# Patient Record
Sex: Male | Born: 1950 | Race: White | Hispanic: No | Smoking: Current every day smoker
Health system: Southern US, Community
[De-identification: ages and names within clinical notes are randomized; demographics above are authoritative.]

## PROBLEM LIST (undated history)

## (undated) DIAGNOSIS — I10 Essential (primary) hypertension: Secondary | ICD-10-CM

## (undated) DIAGNOSIS — J449 Chronic obstructive pulmonary disease, unspecified: Secondary | ICD-10-CM

## (undated) DIAGNOSIS — K219 Gastro-esophageal reflux disease without esophagitis: Secondary | ICD-10-CM

## (undated) DIAGNOSIS — J439 Emphysema, unspecified: Secondary | ICD-10-CM

## (undated) DIAGNOSIS — G40909 Epilepsy, unspecified, not intractable, without status epilepticus: Secondary | ICD-10-CM

## (undated) DIAGNOSIS — R413 Other amnesia: Secondary | ICD-10-CM

## (undated) DIAGNOSIS — S0990XA Unspecified injury of head, initial encounter: Secondary | ICD-10-CM

## (undated) DIAGNOSIS — M549 Dorsalgia, unspecified: Secondary | ICD-10-CM

## (undated) DIAGNOSIS — B1920 Unspecified viral hepatitis C without hepatic coma: Secondary | ICD-10-CM

## (undated) DIAGNOSIS — R0600 Dyspnea, unspecified: Secondary | ICD-10-CM

## (undated) DIAGNOSIS — J189 Pneumonia, unspecified organism: Secondary | ICD-10-CM

## (undated) HISTORY — PX: BRAIN SURGERY: SHX531

## (undated) HISTORY — DX: Chronic obstructive pulmonary disease, unspecified: J44.9

## (undated) HISTORY — DX: Emphysema, unspecified: J43.9

## (undated) HISTORY — PX: SKULL FRACTURE ELEVATION: SHX781

## (undated) HISTORY — PX: MANDIBLE FRACTURE SURGERY: SHX706

## (undated) HISTORY — PX: FRACTURE SURGERY: SHX138

## (undated) HISTORY — DX: Unspecified viral hepatitis C without hepatic coma: B19.20

## (undated) HISTORY — PX: INGUINAL HERNIA REPAIR: SUR1180

## (undated) HISTORY — PX: SHOULDER ARTHROSCOPY: SHX128

## (undated) HISTORY — PX: NASAL FRACTURE SURGERY: SHX718

---

## 2002-02-09 ENCOUNTER — Ambulatory Visit (HOSPITAL_COMMUNITY): Admission: RE | Admit: 2002-02-09 | Discharge: 2002-02-09 | Payer: Self-pay | Admitting: Internal Medicine

## 2002-06-09 ENCOUNTER — Emergency Department (HOSPITAL_COMMUNITY): Admission: EM | Admit: 2002-06-09 | Discharge: 2002-06-09 | Payer: Self-pay | Admitting: Emergency Medicine

## 2002-06-10 ENCOUNTER — Emergency Department (HOSPITAL_COMMUNITY): Admission: EM | Admit: 2002-06-10 | Discharge: 2002-06-10 | Payer: Self-pay | Admitting: Emergency Medicine

## 2002-12-19 ENCOUNTER — Ambulatory Visit (HOSPITAL_COMMUNITY): Admission: RE | Admit: 2002-12-19 | Discharge: 2002-12-19 | Payer: Self-pay | Admitting: Pulmonary Disease

## 2003-01-21 ENCOUNTER — Encounter (HOSPITAL_COMMUNITY): Admission: RE | Admit: 2003-01-21 | Discharge: 2003-02-20 | Payer: Self-pay | Admitting: Pulmonary Disease

## 2004-04-27 ENCOUNTER — Observation Stay (HOSPITAL_COMMUNITY): Admission: RE | Admit: 2004-04-27 | Discharge: 2004-04-27 | Payer: Self-pay | Admitting: General Surgery

## 2004-07-07 ENCOUNTER — Ambulatory Visit (HOSPITAL_COMMUNITY): Admission: RE | Admit: 2004-07-07 | Discharge: 2004-07-07 | Payer: Self-pay | Admitting: Pulmonary Disease

## 2006-05-25 ENCOUNTER — Ambulatory Visit (HOSPITAL_COMMUNITY): Admission: RE | Admit: 2006-05-25 | Discharge: 2006-05-25 | Payer: Self-pay | Admitting: Pulmonary Disease

## 2008-09-16 ENCOUNTER — Ambulatory Visit: Payer: Self-pay | Admitting: Surgery

## 2010-10-12 ENCOUNTER — Ambulatory Visit (HOSPITAL_COMMUNITY)
Admission: RE | Admit: 2010-10-12 | Discharge: 2010-10-12 | Payer: Self-pay | Source: Home / Self Care | Attending: Pulmonary Disease | Admitting: Pulmonary Disease

## 2011-02-05 ENCOUNTER — Emergency Department (HOSPITAL_COMMUNITY)
Admission: EM | Admit: 2011-02-05 | Discharge: 2011-02-05 | Disposition: A | Discharge status: Home / Self Care | Payer: Medicare Other | Attending: Emergency Medicine | Admitting: Emergency Medicine

## 2011-02-05 DIAGNOSIS — I10 Essential (primary) hypertension: Secondary | ICD-10-CM | POA: Insufficient documentation

## 2011-02-05 DIAGNOSIS — G40909 Epilepsy, unspecified, not intractable, without status epilepticus: Secondary | ICD-10-CM | POA: Insufficient documentation

## 2011-02-05 DIAGNOSIS — R350 Frequency of micturition: Secondary | ICD-10-CM | POA: Insufficient documentation

## 2011-02-05 DIAGNOSIS — R109 Unspecified abdominal pain: Secondary | ICD-10-CM | POA: Insufficient documentation

## 2011-02-05 DIAGNOSIS — R300 Dysuria: Secondary | ICD-10-CM | POA: Insufficient documentation

## 2011-02-05 DIAGNOSIS — Z79899 Other long term (current) drug therapy: Secondary | ICD-10-CM | POA: Insufficient documentation

## 2011-02-05 LAB — URINALYSIS, ROUTINE W REFLEX MICROSCOPIC
Glucose, UA: NEGATIVE mg/dL
Ketones, ur: NEGATIVE mg/dL
Leukocytes, UA: NEGATIVE
Nitrite: NEGATIVE
Specific Gravity, Urine: 1.01 (ref 1.005–1.030)
Urobilinogen, UA: 0.2 mg/dL (ref 0.0–1.0)

## 2011-02-07 LAB — URINE CULTURE: Culture  Setup Time: 201204062102

## 2011-03-16 NOTE — Assessment & Plan Note (Signed)
OFFICE VISIT   [name], [name] T  DOB:  [birth year]                                       09/16/2008  ZOXWR#:[id number]   REASON FOR VISIT:  Claudication.   HISTORY:  This is a 60 year old gentleman I am seeing at request of Dr.  Juanetta Gosling for evaluation of left leg claudication.  The patient states  that he has been having left leg pain for many months which has gotten  worse.  He states that he initially began having problems in his left  calf.  He states he has cramping his calf after approximately 40 yards  of walking.  It is relieved by rest.  Over the past several weeks,  however, he has also had a different kind pain which he describes as an  aching feeling that he gets in his thigh and his hip.  This pain will  generally come about at rest and is not related to exercise.  He denies  having any symptoms of rest pain.  He denies having any nonhealing  wounds.   The patient does take medication for high blood pressure.  He is also a  1-pack-a-day smoker.   REVIEW OF SYSTEMS:  GENERAL:  Negative for fevers, chills, weight gain,  weight loss.  CARDIAC:  Negative.  PULMONARY:  Negative.  GI:  Negative.  GU:  Negative.  VASCULAR:  Has pain in legs with walking.  NEURO:  Positive for seizures.  ORTHO:  Positive for back pain.  PSYCH:  Negative.  ENT:  Negative.  HEME:  Negative.   PAST MEDICAL HISTORY:  Hypertension, epilepsy.   PAST SURGICAL HISTORY:  Skull fracture repair, bilateral inguinal  hernia.   FAMILY HISTORY:  Positive for cardiovascular disease at a young age in  his father and his brother.   SOCIAL HISTORY:  He is single, disabled, currently smokes a pack a day,  drinks occasional alcohol.   MEDICATIONS:  Hydrochlorothiazide 25 mg per day, atenolol 50 mg per day,  divalproex EC 500 mg twice daily, aspirin 81 mg twice daily.   ALLERGIES:  None.   PHYSICAL EXAMINATION:  Vital Signs:  Blood pressure is 166/89, pulse 60,  temperature 98.  HEENT:  Normocephalic, atraumatic.  Pupils equal,  round, reactive.  General:  He is well-appearing, no distress.  Neck:  Supple, no JVD, no carotid bruits.  Cardiovascular:  Regular rate and  rhythm.  No murmurs, rubs or gallops.  Pulmonary:  Lungs are clear  bilaterally.  Abdomen:  Soft, no pulsatile mass.  Extremities:  Warm,  well-perfused.  He has palpable femoral pulses.  No ulceration.  Skin:  Without rash.  Neuro:  Cranial nerves II-XII are grossly intact.  Psych:  He is alert and oriented x3.   DIAGNOSTIC STUDIES:  Ankle brachial indices were performed today.  He  has an ankle brachial index of 0.79 on the left and 1.2 on the right.   ASSESSMENT/PLAN:  Left leg claudication.   PLAN:  We had an extensive conversation today regarding the patient's  options for management.  First, I told him that the pain he is having in  his left hip and thigh, which occurs at rest, is unlikely related to his  circulation.  His circulation issues involve the cramping that he gets  in his left calf after walking 40 yards.  We discussed the  options for  management at this time including surgery, percutaneous treatment and  medical therapy.  I do not think the patient's symptoms are severe  enough to warrant any kind of invasive measure at this time and we need  to focus on medical management.  We had a long conversation regarding  the necessity of smoking cessation.  The patient states that he is eager  to stop.  We will need to work out a plan to get him to stop.  I have  also recommended that we start him on cilostazol as this may improve his  walking distance.  I have given him a prescription for this and he will  see how much it costs prior to filling it.  I plan on seeing him back in  6 months for evaluation of how he is doing.   Jorge Ny, MD  Electronically Signed   VWB/MEDQ  D:  09/16/2008  T:  09/17/2008  Job:  [id number]   cc:   Ramon Dredge L. Juanetta Gosling, M.D.

## 2011-03-19 NOTE — Op Note (Signed)
NAME:  [name] [name], [name] [name]                        ACCOUNT NO.:  [id number]   MEDICAL RECORD NO.:  [id number]                   PATIENT TYPE:  AMB   LOCATION:  DAY                                  FACILITY:  [facility]   PHYSICIAN:  Dalia Heading, M.D.               DATE OF BIRTH:  Apr 26, 1951   DATE OF PROCEDURE:  04/27/2004  DATE OF DISCHARGE:                                 OPERATIVE REPORT   PREOPERATIVE DIAGNOSIS:  Recurrent left inguinal hernia.   POSTOPERATIVE DIAGNOSIS:  Recurrent left inguinal hernia.   PROCEDURE:  Recurrent left inguinal herniorrhaphy.   SURGEON:  Dalia Heading, M.D.   ANESTHESIA:  Spinal.   INDICATIONS:  The patient is a 60 year old white male who presents with a  recurrent left inguinal hernia which is symptomatic.  The risks and benefits  of the procedure including bleeding, infection, pain, and the possibility of  recurrence of the hernia were fully explained to the patient, who gave  informed consent.   DESCRIPTION OF PROCEDURE:  The patient was placed in the supine position  after spinal anesthesia was administered.  The left groin region was prepped  and draped using the usual sterile technique with Betadine. Surgical site  confirmation was performed.   A transverse incision was made in the left groin region down to the external  oblique aponeurosis.  The aponeurosis was incised to the external ring.  A  Penrose drain was placed around the spermatic cord.  The vas deferens was  noted within the spermatic cord.  A small lipoma of the cord was found.  This was partially excised.  Care was taken care not to fully skeletonize  the cord.  The patient did have a direct hernia.  This was incised at its  base and a medium size plug was placed in this region, secured  circumferentially to the transversalis fascia using 2-0 Novofil interrupted  sutures.   An [place]lay patch was then placed along the floor of ine inguinal canal and  secured; superiorly to the  conjoined tendon, and inferiorly to the shelving  edge of Poupart's ligament using a 2-0 Novofil interrupted suture.  The  internal ring was recreated using a 2-0 Novofil interrupted suture.  The  external oblique aponeurosis was reapproximated using a 2-0 Vicryl running  suture.  The subcutaneous layer was reapproximated using a 3-0 Vicryl  interrupted suture.  The skin was closed using a 4-0 Vicryl subcuticular  suture  Sensorcaine 0.5% was instilled into the surrounding wound and the  wound was covered with Collodion.   All tape and needle counts were correct at the end of the procedure.  The  patient was transferred to PACU in stable condition.   COMPLICATIONS:  None.   SPECIMENS:  None.   BLOOD LOSS:  Minimal.      ___________________________________________  Dalia Heading, M.D.   MAJ/MEDQ  D:  04/27/2004  T:  04/27/2004  Job:  [id number]   cc:   Dalia Heading, M.D.  [address], [place]  [place] [address]  Fax: [phone]   Oneal Deputy. Juanetta Gosling, M.D.  [address]  [place]  [place] [address]  Fax: [phone]

## 2011-03-19 NOTE — H&P (Signed)
NAME:  [name] [name], [name] [name] NO.:  [id number]   MEDICAL RECORD NO.:  [id number]                  PATIENT TYPE:   LOCATION:                                       FACILITY:   PHYSICIAN:  Dalia Heading, M.D.               DATE OF BIRTH:  Mar 20, 1951   DATE OF ADMISSION:  DATE OF DISCHARGE:                                HISTORY & PHYSICAL   CHIEF COMPLAINT:  Recurrent left inguinal hernia.   HISTORY OF PRESENT ILLNESS:  The patient is a 60 year old white male who is  referred for evaluation and treatment of recurrent left inguinal hernia.  He  had left inguinal herniorrhaphy in the remote past.  He started having  increasing swelling and discomfort over the past few years.  No nausea or  vomiting had been noted.   PAST MEDICAL HISTORY:  Epilepsy.   PAST SURGICAL HISTORY:  1. Bilateral inguinal herniorrhaphies.  2. Brain surgery in the past.   CURRENT MEDICATIONS:  1. Depakote 500 mg p.o. b.i.d.  2. Atenolol one tablet p.o. daily.  3. Hydrochlorothiazide one tablet p.o. daily.   ALLERGIES:  No known drug allergies.   REVIEW OF SYSTEMS:  The patient states he has a history of hepatitis C due  to a blood transfusion.  His epilepsy is currently under control.  No other  cardiopulmonary difficulties or bleeding disorders were noted.   SOCIAL HISTORY:  He does smoke a pack and a half of cigarettes a day.  He  drinks alcohol on occasion.   PHYSICAL EXAMINATION:  GENERAL APPEARANCE:  Well-developed, well-nourished  white male in no acute distress.  VITAL SIGNS:  Afebrile, vital signs stable.  LUNGS:  Clear to auscultation with good breath sounds bilaterally.  HEART:  Regular rate and rhythm without S3, S4 or murmurs.  ABDOMEN:  Soft, nontender, nondistended.  No hepatosplenomegaly or masses  are noted.  Reducible left inguinal hernia is noted.  No right inguinal  hernia is noted.  GU:  Within normal limits.   IMPRESSION:  Recurrent left inguinal  hernia.   PLAN:  The patient is scheduled for recurrent left inguinal herniorrhaphy on  04/27/2004.  The risks and benefits of the procedure including bleeding,  infection and recurrence of the hernia were fully explained to the patient.  Gave informed consent.     ___________________________________________                                         Dalia Heading, M.D.   MAJ/MEDQ  D:  04/15/2004  T:  04/15/2004  Job:  [id number]   cc:   Ramon Dredge L. Juanetta Gosling, M.D.  [address]  [place]  [place] [address]  Fax: [phone]

## 2011-03-19 NOTE — H&P (Signed)
[facility]  Patient:    [name] [name] Visit Number: [id number] MRN: [id number]          Service Type: END Location: DAY Attending Physician:  [name] [name] Dictated by:   [name] [name], M.D. Admit Date:  02/09/2002 Discharge Date: 02/09/2002   CC:         [name] [name], M.D.  Ms. [name] [name], [facility] [address], [place], [place]   History and Physical  PRESENTING COMPLAINT:  Followed for chronic hepatitis C.  HISTORY OF PRESENT ILLNESS:  [name] [name] is a 60 year old Caucasian male, a patient of Dr. Kari [name], who is here to discuss further evaluation of chronic hepatitis C.  He has had mildly elevated liver-associated enzymes in the past. He decided to go to [facility] to donate blood, which was in May 2001; he states he really went to find out if he had hepatitis C or not.  This hepatitis C serology came back positive.  Once he became aware that he had hepatitis C, he decided to quit drinking alcohol, which he was only drinking intermittently.  His hepatitis B surface antigen was negative and hepatitis B core antibody (total) was also negative.  At that time, his ALT was 45.  He was initially seen on August 29, 2000.  His AST was 75 and gamma GT was 47, but ALT had not been checked.  His LFTs were repeated and AST was 52, ALT 39 (September 05, 2000).  Hepatitis C virus RNA antibody by PCR was positive. Further evaluation included upper abdominal ultrasound which was within normal limits (September 01, 2000).  Options were reviewed with the patient at that time and it was decided to just monitor him for a while.  His lab studies on December 08, 2000 revealed AST of 49 and ALT of 31; his albumin was 3.9.  He has been doing his own research and reading about hepatitis C.  He states that he is feeling fairly well, although he feel full in his right upper quadrant. He is always aware of the sensation.  He wonders if this is  just pathologic. He has a good appetite.  Since his first visit to our office, he has gained 10 pounds.  He does complain of fatigue which is mild.  He is working some part-time.  He denies skin rash, arthralgias or pruritus.  He also denies LE edema or abdominal swelling.  PRESENT MEDICATIONS: 1. Depakote 500 mg b.i.d. 2. Atenolol 50 mg q.d. 3. HCTZ 25 mg q.d.  PAST MEDICAL HISTORY:  He has hypertension and epilepsy.  He developed epilepsy at age 59 with a relapse five or six years ago.  At that time, he was found unconscious at home with multiple injuries and was treated at [facility], where he was hospitalized for four months; he was in a coma for several weeks and then he spent three more months in rehab.  He has gradually recovered a great deal.  He still does not know what actually happened to him.  He does give a history of neonatal jaundice but he states other siblings also had it.  He has had bilateral inguinal herniorrhaphies.  ALLERGIES:  None known.  FAMILY HISTORY:  He has one sister and two brothers in good health.  One brother has had CAD and died at age 17.  SOCIAL HISTORY:  He is divorced.  He does not have any children.  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  He lives alone.  He has been smoking for 25 years, less than a pack a day.  He used to drink socially but not every day, but has not had any alcohol in about two years.  PHYSICAL EXAMINATION:  GENERAL:  A pleasant, well-developed, well-nourished Caucasian male who is in no acute distress.  He weighs 186-1/2 pounds.  He is 5-feet 9-inches tall.  VITAL SIGNS:  Pulse 66 per minute, blood pressure 130/80.  HEENT:  Conjunctivae are pink.  Sclerae nonicteric.  NECK:  No adenopathy or thyromegaly.  HEART:  Within normal limits.  LUNGS:  Within normal limits.  ABDOMEN:  His abdomen is symmetrical and soft.  Liver edge is easily palpable below the right costal margin and is soft and nontender.  Span is  percussed to 12 to 13 cm.  Spleen is not palpable.  EXTREMITIES:  There is no peripheral edema noted.  ASSESSMENT:  [name] [name] is a 60 year old Caucasian male who was diagnosed with hepatitis C about two years ago.  His transaminases have been mildly abnormal initially but more recently, they have been normal.  Three weeks ago, his AST was 36 and ALT 21.  His risk factors include tattoos (two), not mentioned above.  He does not have any stigmata of chronic liver disease.  Before recommendations are made regarding treatment, one needs to know the disease activity.  If he has a chronic active hepatitis with fibrosis, he would be a candidate for antiviral therapy.  However, if he is diagnosed to have mild disease without fibrosis, we may want to hold off therapy until better options are available.  He is very much interested in liver biopsy, as he would like to know the stage of his liver disease.  Percutaneous liver biopsy to be performed at [facility] in near future.  I have reviewed the procedure and risks with the patient.  He was informed of the potential risk of bleeding and some discomfort, which we should be able to effectively treat.  We also reviewed other options of liver biopsy such as laparoscopic.  He wants to proceed with percutaneous biopsy.  He will have preoperative complete blood count, PT, PTT, bleeding time and he also will have type-and-hold for two units prior to liver biopsy.  Procedure will be performed at day hospital and hopefully he will be able to go home the same afternoon. Dictated by:   [name] [name], M.D. Attending Physician:  [name] [name] DD:  01/30/02 TD:  01/31/02 Job: [id number]

## 2011-03-19 NOTE — Op Note (Signed)
[facility]  Patient:    [name] [name], [name] [name] Visit Number: [id number] MRN: [id number]          Service Type: END Location: DAY Attending Physician:  Malissa Hippo Dictated by:   Lionel December, M.D. Proc. Date: 02/09/02   CC:         Kari Baars, M.D.   Operative Report  PROCEDURE:  Percutaneous liver biopsy.  INDICATIONS FOR PROCEDURE:  Dionne is a 60 year old Caucasian male with chronic hepatitis C. He is undergoing liver biopsy for staging to determine whether or not he has significant disease and might benefit from antiviral therapy. The procedure risks were reviewed with the patient and informed consent was obtained.  FINDINGS:  The procedure performed in the endoscopy suite. The patient was placed in supine position and liver ______ mid axillary line and side was marked. A puncture. The skin was prepped in the usual fashion with Betadine solution and isolated using sterile sheet. One percent xylocaine was injected in the skin and subcutaneous tissue using a 25 gauge needle. Deeper injections made with a 22 gauge spinal needle. Liver sounding with trocar of same needle. Liver biopsy was performed using ______ soft tissue biopsy needle. Two passed were made with the patient holding his breath at end expiration. Adequate liver tissue was obtained. The puncture site was covered with a Band-Aid and the patient was asked to lie on his right side.  FINAL DIAGNOSES:  Chronic hepatitis C status post percutaneous liver biopsy to determine disease ______.  PLAN:  He will be monitored in the hospital for next six hours. H&H will be repeated in about three hours. He should be able to go home this afternoon. Dictated by:   Lionel December, M.D. Attending Physician:  Malissa Hippo DD:  02/09/02 TD:  02/09/02 Job: [id number]

## 2011-05-18 NOTE — H&P (Signed)
[name] [name] is an 60 y.o. male.   Chief Complaint: Recurrent right inguinal hernia HPI: Patient is a 60 yo wm who presents with a symptomatic recurrent right inguinal hernia.  Was previously repaired in the remote past.  No past medical history on file.  No past surgical history on file.  No family history on file. Social History:  does not have a smoking history on file. He does not have any smokeless tobacco history on file. His alcohol and drug histories not on file.  Allergies: Allergies not on file  No current facility-administered medications on file as of .   No current outpatient prescriptions on file as of .    No results found for this or any previous visit (from the past 48 hour(s)). No results found.  Review of Systems  Constitutional: Negative.   HENT: Negative.   Eyes: Negative.   Respiratory: Negative.   Cardiovascular: Negative.   Gastrointestinal: Negative.   Genitourinary: Negative.   Musculoskeletal: Negative.   Skin: Negative.   Neurological: Negative.   Endo/Heme/Allergies: Negative.   Psychiatric/Behavioral: Negative.     There were no vitals taken for this visit. Physical Exam  Constitutional: He is oriented to person, place, and time. He appears well-developed and well-nourished.  HENT:  Head: Normocephalic and atraumatic.  Eyes: Pupils are equal, round, and reactive to light.  Neck: Normal range of motion. Neck supple.  Cardiovascular: Normal rate, regular rhythm and normal heart sounds.   Respiratory: Effort normal and breath sounds normal.  GI: Soft. Bowel sounds are normal.       Reducible right inguinal hernia beneath a surgical scar.  Neurological: He is alert and oriented to person, place, and time.  Skin: Skin is warm.  Psychiatric: He has a normal mood and affect. His behavior is normal. Judgment and thought content normal.     Assessment/Plan Recurrent right inguinal hernia Plan:  Scheduled for recurrent right inguinal  herniorrhaphy on 05/26/11.  Olanda Boughner A 05/18/2011, 5:58 PM

## 2011-05-24 ENCOUNTER — Other Ambulatory Visit: Payer: Self-pay

## 2011-05-24 ENCOUNTER — Encounter (HOSPITAL_COMMUNITY)
Admission: RE | Admit: 2011-05-24 | Discharge: 2011-05-24 | Disposition: A | Discharge status: Home / Self Care | Payer: Medicare Other | Source: Ambulatory Visit | Attending: General Surgery | Admitting: General Surgery

## 2011-05-24 ENCOUNTER — Encounter (HOSPITAL_COMMUNITY): Payer: Self-pay

## 2011-05-24 HISTORY — DX: Essential (primary) hypertension: I10

## 2011-05-24 HISTORY — DX: Dorsalgia, unspecified: M54.9

## 2011-05-24 LAB — SURGICAL PCR SCREEN
MRSA, PCR: NEGATIVE
Staphylococcus aureus: NEGATIVE

## 2011-05-24 LAB — CBC
HCT: 42.6 % (ref 39.0–52.0)
Hemoglobin: 15.2 g/dL (ref 13.0–17.0)
MCH: 34 pg (ref 26.0–34.0)
MCV: 95.3 fL (ref 78.0–100.0)
RDW: 13.4 % (ref 11.5–15.5)

## 2011-05-24 LAB — BASIC METABOLIC PANEL
Chloride: 97 mEq/L (ref 96–112)
Creatinine, Ser: 0.68 mg/dL (ref 0.50–1.35)
GFR calc non Af Amer: >60 mL/min (ref >60)
Potassium: 4 mEq/L (ref 3.5–5.1)

## 2011-05-24 NOTE — Patient Instructions (Addendum)
20 [name] [name]  05/24/2011   Your procedure is scheduled on:  05/26/2011  Report to [facility] at 730 AM.  Call this number if you have problems the morning of surgery: [phone]   Remember:   Do not eat food:After Midnight.  Do not drink clear liquids: After Midnight.  Take these medicines the morning of surgery with A SIP OF WATER: Depakote, Atenolol,Hctz   Do not wear jewelry, make-up or nail polish.  Do not bring valuables to the hospital.  Contacts, dentures or bridgework may not be worn into surgery.  Leave suitcase in the car. After surgery it may be brought to your room.  For patients admitted to the hospital, checkout time is 11:00 AM the day of discharge.   Patients discharged the day of surgery will not be allowed to drive home.  Name and phone number of your driver: Family  Special Instructions: CHG Shower Shower 2 days before surgery and 1 day before surgery with Hibiclens.   Please read over the following fact sheets that you were given: Pain Booklet, MRSA Information, Surgical Site Infection Prevention and Anesthesia Post-op Instructions PATIENT INSTRUCTIONS POST-ANESTHESIA  IMMEDIATELY FOLLOWING SURGERY:  Do not drive or operate machinery for the first twenty four hours after surgery.  Do not make any important decisions for twenty four hours after surgery or while taking narcotic pain medications or sedatives.  If you develop intractable nausea and vomiting or a severe headache please notify your doctor immediately.  FOLLOW-UP:  Please make an appointment with your surgeon as instructed. You do not need to follow up with anesthesia unless specifically instructed to do so.  WOUND CARE INSTRUCTIONS (if applicable):  Keep a dry clean dressing on the anesthesia/puncture wound site if there is drainage.  Once the wound has quit draining you may leave it open to air.  Generally you should leave the bandage intact for twenty four hours unless there is drainage.  If the  epidural site drains for more than 36-48 hours please call the anesthesia department.  QUESTIONS?:  Please feel free to call your physician or the hospital operator if you have any questions, and they will be happy to assist you.     [facility] Anesthesia Department [address] [place] [phone]

## 2011-05-26 ENCOUNTER — Encounter (HOSPITAL_COMMUNITY)
Admission: RE | Disposition: A | Discharge status: Home / Self Care | Payer: Self-pay | Source: Ambulatory Visit | Attending: General Surgery

## 2011-05-26 ENCOUNTER — Ambulatory Visit (HOSPITAL_COMMUNITY)
Admission: RE | Admit: 2011-05-26 | Discharge: 2011-05-26 | Disposition: A | Discharge status: Home / Self Care | Payer: Medicare Other | Source: Ambulatory Visit | Attending: General Surgery | Admitting: General Surgery

## 2011-05-26 ENCOUNTER — Encounter (HOSPITAL_COMMUNITY): Payer: Self-pay | Admitting: Anesthesiology

## 2011-05-26 ENCOUNTER — Encounter (HOSPITAL_COMMUNITY): Payer: Self-pay | Admitting: *Deleted

## 2011-05-26 ENCOUNTER — Ambulatory Visit (HOSPITAL_COMMUNITY): Payer: Medicare Other | Admitting: Anesthesiology

## 2011-05-26 DIAGNOSIS — K469 Unspecified abdominal hernia without obstruction or gangrene: Secondary | ICD-10-CM

## 2011-05-26 DIAGNOSIS — K4091 Unilateral inguinal hernia, without obstruction or gangrene, recurrent: Secondary | ICD-10-CM | POA: Insufficient documentation

## 2011-05-26 DIAGNOSIS — IMO0001 Reserved for inherently not codable concepts without codable children: Secondary | ICD-10-CM

## 2011-05-26 HISTORY — PX: INGUINAL HERNIA REPAIR: SHX194

## 2011-05-26 SURGERY — REPAIR, HERNIA, INGUINAL, ADULT
Anesthesia: Spinal | Laterality: Right | Wound class: Clean

## 2011-05-26 MED ORDER — ENOXAPARIN SODIUM 40 MG/0.4ML ~~LOC~~ SOLN
SUBCUTANEOUS | Status: AC
  Administered 2011-05-26: 40 mg via SUBCUTANEOUS
  Filled 2011-05-26: qty 0.4

## 2011-05-26 MED ORDER — PROPOFOL 10 MG/ML IV EMUL
INTRAVENOUS | Status: AC
  Filled 2011-05-26: qty 20

## 2011-05-26 MED ORDER — CEFAZOLIN SODIUM-DEXTROSE 2-3 GM-% IV SOLR
2.0000 g | INTRAVENOUS | Status: AC
  Administered 2011-05-26: 2 g via INTRAVENOUS

## 2011-05-26 MED ORDER — MIDAZOLAM HCL 2 MG/2ML IJ SOLN
INTRAMUSCULAR | Status: AC
  Filled 2011-05-26: qty 2

## 2011-05-26 MED ORDER — MIDAZOLAM HCL 2 MG/2ML IJ SOLN
INTRAMUSCULAR | Status: AC
  Administered 2011-05-26: 2 mg via INTRAVENOUS
  Filled 2011-05-26: qty 2

## 2011-05-26 MED ORDER — KETOROLAC TROMETHAMINE 30 MG/ML IJ SOLN
30.0000 mg | Freq: Once | INTRAMUSCULAR | Status: AC
  Administered 2011-05-26: 30 mg via INTRAVENOUS

## 2011-05-26 MED ORDER — BUPIVACAINE HCL (PF) 0.5 % IJ SOLN
INTRAMUSCULAR | Status: AC
  Filled 2011-05-26: qty 30

## 2011-05-26 MED ORDER — HYDROCODONE-ACETAMINOPHEN 5-325 MG PO TABS: ORAL_TABLET | ORAL | Status: DC

## 2011-05-26 MED ORDER — PROPOFOL 10 MG/ML IV EMUL
INTRAVENOUS | Status: DC | PRN
  Administered 2011-05-26: 35 ug/kg/min via INTRAVENOUS

## 2011-05-26 MED ORDER — LIDOCAINE IN DEXTROSE 5-7.5 % IV SOLN
INTRAVENOUS | Status: DC | PRN
  Administered 2011-05-26: 75 mg via INTRATHECAL

## 2011-05-26 MED ORDER — LIDOCAINE HCL (PF) 1 % IJ SOLN
INTRAMUSCULAR | Status: AC
  Filled 2011-05-26: qty 5

## 2011-05-26 MED ORDER — LACTATED RINGERS IV SOLN
INTRAVENOUS | Status: DC
  Administered 2011-05-26 (×2): via INTRAVENOUS

## 2011-05-26 MED ORDER — LIDOCAINE IN DEXTROSE 5-7.5 % IV SOLN
INTRAVENOUS | Status: AC
  Filled 2011-05-26: qty 2

## 2011-05-26 MED ORDER — FENTANYL CITRATE 0.05 MG/ML IJ SOLN
INTRAMUSCULAR | Status: DC | PRN
  Administered 2011-05-26: 50 ug via INTRAVENOUS
  Administered 2011-05-26: 25 ug via INTRAVENOUS

## 2011-05-26 MED ORDER — FENTANYL CITRATE 0.05 MG/ML IJ SOLN
INTRAMUSCULAR | Status: DC | PRN
  Administered 2011-05-26: 25 ug via INTRATHECAL

## 2011-05-26 MED ORDER — CEFAZOLIN SODIUM 1-5 GM-% IV SOLN
INTRAVENOUS | Status: AC
  Filled 2011-05-26: qty 100

## 2011-05-26 MED ORDER — ONDANSETRON HCL 4 MG/2ML IJ SOLN: 4.0000 mg | Freq: Once | INTRAMUSCULAR | Status: DC | PRN

## 2011-05-26 MED ORDER — ENOXAPARIN SODIUM 40 MG/0.4ML ~~LOC~~ SOLN
40.0000 mg | Freq: Once | SUBCUTANEOUS | Status: AC
  Administered 2011-05-26: 40 mg via SUBCUTANEOUS

## 2011-05-26 MED ORDER — FENTANYL CITRATE 0.05 MG/ML IJ SOLN: 25.0000 ug | INTRAMUSCULAR | Status: DC | PRN

## 2011-05-26 MED ORDER — BUPIVACAINE HCL (PF) 0.25 % IJ SOLN
INTRAMUSCULAR | Status: DC | PRN
  Administered 2011-05-26: 10 mL

## 2011-05-26 MED ORDER — KETOROLAC TROMETHAMINE 30 MG/ML IJ SOLN
INTRAMUSCULAR | Status: AC
  Administered 2011-05-26: 30 mg via INTRAVENOUS
  Filled 2011-05-26: qty 1

## 2011-05-26 MED ORDER — FENTANYL CITRATE 0.05 MG/ML IJ SOLN
INTRAMUSCULAR | Status: AC
  Filled 2011-05-26: qty 2

## 2011-05-26 MED ORDER — MIDAZOLAM HCL 2 MG/2ML IJ SOLN
1.0000 mg | INTRAMUSCULAR | Status: DC | PRN
  Administered 2011-05-26: 2 mg via INTRAVENOUS

## 2011-05-26 MED ORDER — SODIUM CHLORIDE 0.9 % IR SOLN
Status: DC | PRN
  Administered 2011-05-26: 1000 mL

## 2011-05-26 MED ORDER — MIDAZOLAM HCL 5 MG/5ML IJ SOLN
INTRAMUSCULAR | Status: DC | PRN
  Administered 2011-05-26: 2 mg via INTRAVENOUS

## 2011-05-26 SURGICAL SUPPLY — 41 items
BAG HAMPER (MISCELLANEOUS) ×2 IMPLANT
CLOTH BEACON ORANGE TIMEOUT ST (SAFETY) ×2 IMPLANT
COVER LIGHT HANDLE STERIS (MISCELLANEOUS) ×4 IMPLANT
DECANTER SPIKE VIAL GLASS SM (MISCELLANEOUS) ×2 IMPLANT
DERMABOND ADVANCED (GAUZE/BANDAGES/DRESSINGS) ×1
DERMABOND ADVANCED .7 DNX12 (GAUZE/BANDAGES/DRESSINGS) ×1 IMPLANT
DRAIN PENROSE 18X.75 LTX STRL (MISCELLANEOUS) ×2 IMPLANT
ELECT REM PT RETURN 9FT ADLT (ELECTROSURGICAL) ×2
ELECTRODE REM PT RTRN 9FT ADLT (ELECTROSURGICAL) ×1 IMPLANT
FORMALIN 10 PREFIL 120ML (MISCELLANEOUS) IMPLANT
GLOVE BIO SURGEON STRL SZ7.5 (GLOVE) ×2 IMPLANT
GLOVE BIOGEL PI IND STRL 7.0 (GLOVE) ×1 IMPLANT
GLOVE BIOGEL PI IND STRL 8.5 (GLOVE) ×1 IMPLANT
GLOVE BIOGEL PI INDICATOR 7.0 (GLOVE) ×1
GLOVE BIOGEL PI INDICATOR 8.5 (GLOVE) ×1
GLOVE ECLIPSE 6.5 STRL STRAW (GLOVE) ×2 IMPLANT
GLOVE ECLIPSE 8.0 STRL XLNG CF (GLOVE) ×2 IMPLANT
GOWN BRE IMP SLV AUR XL STRL (GOWN DISPOSABLE) ×6 IMPLANT
INST SET MINOR GENERAL (KITS) ×2 IMPLANT
KIT ROOM TURNOVER APOR (KITS) ×2 IMPLANT
MANIFOLD NEPTUNE II (INSTRUMENTS) ×2 IMPLANT
MESH MARLEX PLUG MEDIUM (Mesh General) ×2 IMPLANT
NEEDLE HYPO 25X1 1.5 SAFETY (NEEDLE) ×2 IMPLANT
NS IRRIG 1000ML POUR BTL (IV SOLUTION) ×2 IMPLANT
PACK MINOR (CUSTOM PROCEDURE TRAY) ×2 IMPLANT
PAD ARMBOARD 7.5X6 YLW CONV (MISCELLANEOUS) ×2 IMPLANT
PAIN PUMP ON-Q 100MLX2ML 2.5IN (PAIN MANAGEMENT) IMPLANT
SET BASIN LINEN APH (SET/KITS/TRAYS/PACK) ×2 IMPLANT
SOL PREP PROV IODINE SCRUB 4OZ (MISCELLANEOUS) ×2 IMPLANT
SUT NOVA NAB GS-22 2 2-0 T-19 (SUTURE) ×4 IMPLANT
SUT NOVAFIL NAB HGS22 2-0 30IN (SUTURE) IMPLANT
SUT SILK 3 0 (SUTURE) ×1
SUT SILK 3-0 18XBRD TIE 12 (SUTURE) ×1 IMPLANT
SUT VIC AB 2-0 CT1 27 (SUTURE) ×2
SUT VIC AB 2-0 CT1 TAPERPNT 27 (SUTURE) ×2 IMPLANT
SUT VIC AB 3-0 SH 27 (SUTURE) ×1
SUT VIC AB 3-0 SH 27X BRD (SUTURE) ×1 IMPLANT
SUT VIC AB 4-0 PS2 27 (SUTURE) ×4 IMPLANT
SUT VICRYL AB 3 0 TIES (SUTURE) IMPLANT
SYR CONTROL 10ML LL (SYRINGE) ×2 IMPLANT
TOWEL OR 17X26 4PK STRL BLUE (TOWEL DISPOSABLE) IMPLANT

## 2011-05-26 NOTE — Transfer of Care (Signed)
Immediate Anesthesia Transfer of Care Note  Patient: [name] [name]  Procedure(s) Performed:  HERNIA REPAIR INGUINAL ADULT - Recurrent Right Inguinal Hernia Repair with Mesh  Patient Location: PACU  Anesthesia Type: Spinal  Level of Consciousness: awake, alert  and oriented  Airway & Oxygen Therapy: Patient Spontanous Breathing  Post-op Assessment: Report given to PACU RN..... Motor In BOTH Legs!  Post vital signs: Reviewed and stable  Complications: No apparent anesthesia complications

## 2011-05-26 NOTE — Preoperative (Signed)
Beta Blockers   Reason not to administer Beta Blockers:Not Applicable, TOOK BB @ 0600 Today

## 2011-05-26 NOTE — H&P (Signed)
No new medical problems.  OK for surgery. 

## 2011-05-26 NOTE — Anesthesia Preprocedure Evaluation (Signed)
Anesthesia Evaluation  Name, MR# and DOB Patient awake  General Assessment Comment  Reviewed: Allergy & Precautions, H&P  and Patient's Chart, lab work & pertinent test results  History of Anesthesia Complications Negative for: history of anesthetic complications  Airway Mallampati: II  Neck ROM: Full    Dental  (+) Edentulous Upper, Poor Dentition, Chipped and Dental Advisory Given   Pulmonary  COPD (2ppd smoker)      Cardiovascular hypertension, Pt. on medications and Pt. on home beta blockers Regular Normal   Neuro/Psych (+) {AN ROS/MED HX NEURO HEADACHES Seizures - (none by 16 yrs) and Well Controlled, Poor long term memory    GI/Hepatic/Renal (+)    (+) Hepatitis -, C   Endo/Other   (+) Diabetes mellitus-, Well Controlled, Type 2  Abdominal   Musculoskeletal  Hematology   Peds  Reproductive/Obstetrics   Anesthesia Other Findings             Anesthesia Physical Anesthesia Plan  ASA: III  Anesthesia Plan: Spinal   Post-op Pain Management:    Induction:   Airway Management Planned: Nasal Cannula  Additional Equipment:   Intra-op Plan:   Post-operative Plan:   Informed Consent: I have reviewed the patients History and Physical, chart, labs and discussed the procedure including the risks, benefits and alternatives for the proposed anesthesia with the patient or authorized representative who has indicated his/her understanding and acceptance.     Plan Discussed with:   Anesthesia Plan Comments:         Anesthesia Quick Evaluation

## 2011-05-26 NOTE — Anesthesia Postprocedure Evaluation (Signed)
  Anesthesia Post-op Note  Patient: [name] [name]  Procedure(s) Performed:  HERNIA REPAIR INGUINAL ADULT - Recurrent Right Inguinal Hernia Repair with Mesh  Patient Location: PACU  Anesthesia Type: Spinal  Level of Consciousness: alert   Airway and Oxygen Therapy: Patient Spontanous Breathing  Post-op Pain: none  Post-op Assessment: Patient's Cardiovascular Status Stable, Respiratory Function Stable, Patent Airway and No signs of Nausea or vomiting  Post-op Vital Signs: stable  Complications: No apparent anesthesia complications

## 2011-05-26 NOTE — Op Note (Signed)
Patient:  [name] [name]  DOB:  Oct 16, 1951  MRN:  [id number]   Preop Diagnosis:  Recurrent right inguinal hernia  Postop Diagnosis:  Same, direct  Procedure:  Recurrent right inguinal herniorrhaphy  Surgeon:  Franky Macho M.D.  Anes:  Spinal  Indications:  Patient is a 60 year old white male status post a right anal herniorrhaphy in the remote past who now presents with a recurrence. The risks and benefits of the procedure including bleeding, infection, recurrence, and pain were fully explained to the patient, gave informed consent.  Procedure note:  Patient was placed in the supine position after spinal anesthesia was administered. The right groin region was prepped and draped using the usual sterile technique with Betadine. Surgical site confirmation was performed.  An oblique incision was made to the previous surgical scar in the right groin region. This was taken down to the spermatic cord. The external oblique aponeurosis was noted to be very attenuated. A Penrose drain was placed around the spermatic cord. The vas deferens  was noted within the spermatic cord. The patient was noted to have a direct hernia. This was incised at its base and inverted. A medium-sized Bard prefix plug was then inserted and secured to the transversalis fascia using 2-0 Novafil interrupted sutures. The external oblique a Penrose this was reapproximated loosely using 2-0 Vicryl interrupted sutures. The subcutaneous layer was reapproximated using 3-0 Vicryl interrupted sutures.  The skin was closed using a 4-0 Vicryl subcuticular suture. 0.5% Sensorcaine was instilled in the surrounding wound. Dermabond was then applied.  All tape and needle counts were correct at the end of the procedure. The patient was transferred to PACU in stable condition.  Complications:  None  EBL:  Minimal  Specimen:  None

## 2011-05-26 NOTE — Anesthesia Procedure Notes (Addendum)
Spinal Block  Patient location during procedure: OR Start time: 05/26/2011 8:36 AM Staffing CRNA/Resident: Marylene Buerger Performed by: resident/CRNA  Preanesthetic Checklist Completed: patient identified, surgical consent, pre-op evaluation, IV checked and monitors and equipment checked Spinal Block Patient position: right lateral decubitus  Spinal Block  Start time: 05/26/2011 8:46 AM Spinal Block Patient position: right lateral decubitus Prep: Betadine Patient monitoring: cardiac monitor, heart rate, continuous pulse ox and blood pressure Approach: midline Location: L4-5 Injection technique: single-shot Needle Needle type: Sprotte  Needle gauge: 24 G Needle length: 10 cm Additional Notes [id number] 2012  -  10  Lidocaine 7.5%   Dose 75 mg. Fentalyl  25 Mcgs

## 2011-06-02 ENCOUNTER — Encounter (HOSPITAL_COMMUNITY): Payer: Self-pay | Admitting: General Surgery

## 2013-05-01 ENCOUNTER — Emergency Department (HOSPITAL_COMMUNITY)
Admission: EM | Admit: 2013-05-01 | Discharge: 2013-05-01 | Disposition: A | Discharge status: Home / Self Care | Payer: Medicare Other | Attending: Emergency Medicine | Admitting: Emergency Medicine

## 2013-05-01 ENCOUNTER — Emergency Department (HOSPITAL_COMMUNITY): Payer: Medicare Other

## 2013-05-01 ENCOUNTER — Encounter (HOSPITAL_COMMUNITY): Payer: Self-pay | Admitting: *Deleted

## 2013-05-01 DIAGNOSIS — J40 Bronchitis, not specified as acute or chronic: Secondary | ICD-10-CM

## 2013-05-01 DIAGNOSIS — J441 Chronic obstructive pulmonary disease with (acute) exacerbation: Secondary | ICD-10-CM | POA: Insufficient documentation

## 2013-05-01 DIAGNOSIS — J449 Chronic obstructive pulmonary disease, unspecified: Secondary | ICD-10-CM

## 2013-05-01 DIAGNOSIS — I10 Essential (primary) hypertension: Secondary | ICD-10-CM | POA: Insufficient documentation

## 2013-05-01 DIAGNOSIS — Z7982 Long term (current) use of aspirin: Secondary | ICD-10-CM | POA: Insufficient documentation

## 2013-05-01 DIAGNOSIS — G40909 Epilepsy, unspecified, not intractable, without status epilepticus: Secondary | ICD-10-CM | POA: Insufficient documentation

## 2013-05-01 DIAGNOSIS — F17200 Nicotine dependence, unspecified, uncomplicated: Secondary | ICD-10-CM | POA: Insufficient documentation

## 2013-05-01 DIAGNOSIS — R630 Anorexia: Secondary | ICD-10-CM | POA: Insufficient documentation

## 2013-05-01 DIAGNOSIS — Z87828 Personal history of other (healed) physical injury and trauma: Secondary | ICD-10-CM | POA: Insufficient documentation

## 2013-05-01 DIAGNOSIS — Z79899 Other long term (current) drug therapy: Secondary | ICD-10-CM | POA: Insufficient documentation

## 2013-05-01 LAB — CBC WITH DIFFERENTIAL/PLATELET
Eosinophils Absolute: 0 10*3/uL (ref 0.0–0.7)
Eosinophils Relative: 0 % (ref 0–5)
HCT: 41.3 % (ref 39.0–52.0)
Hemoglobin: 15 g/dL (ref 13.0–17.0)
Lymphocytes Relative: 7 % — ABNORMAL LOW (ref 12–46)
Lymphs Abs: 1.5 10*3/uL (ref 0.7–4.0)
MCH: 34.1 pg — ABNORMAL HIGH (ref 26.0–34.0)
MCV: 93.9 fL (ref 78.0–100.0)
Monocytes Absolute: 2 10*3/uL — ABNORMAL HIGH (ref 0.1–1.0)
Monocytes Relative: 10 % (ref 3–12)
Platelets: 302 10*3/uL (ref 150–400)
RBC: 4.4 MIL/uL (ref 4.22–5.81)

## 2013-05-01 LAB — BASIC METABOLIC PANEL
BUN: 15 mg/dL (ref 6–23)
CO2: 34 mEq/L — ABNORMAL HIGH (ref 19–32)
Calcium: 9 mg/dL (ref 8.4–10.5)
GFR calc non Af Amer: >90 mL/min (ref >90)
Glucose, Bld: 127 mg/dL — ABNORMAL HIGH (ref 70–99)

## 2013-05-01 MED ORDER — IPRATROPIUM BROMIDE 0.02 % IN SOLN
0.5000 mg | Freq: Once | RESPIRATORY_TRACT | Status: AC
  Administered 2013-05-01: 0.5 mg via RESPIRATORY_TRACT
  Filled 2013-05-01: qty 2.5

## 2013-05-01 MED ORDER — ALBUTEROL SULFATE HFA 108 (90 BASE) MCG/ACT IN AERS
2.0000 | INHALATION_SPRAY | RESPIRATORY_TRACT | Status: DC | PRN
  Administered 2013-05-01: 2 via RESPIRATORY_TRACT
  Filled 2013-05-01: qty 6.7

## 2013-05-01 MED ORDER — PREDNISONE 10 MG PO TABS: 20.0000 mg | ORAL_TABLET | Freq: Every day | ORAL | Status: DC

## 2013-05-01 MED ORDER — ALBUTEROL SULFATE (5 MG/ML) 0.5% IN NEBU: 5.0000 mg | INHALATION_SOLUTION | Freq: Once | RESPIRATORY_TRACT | Status: DC

## 2013-05-01 MED ORDER — IPRATROPIUM BROMIDE 0.02 % IN SOLN: 0.5000 mg | Freq: Once | RESPIRATORY_TRACT | Status: DC

## 2013-05-01 MED ORDER — MOXIFLOXACIN HCL 400 MG PO TABS: 400.0000 mg | ORAL_TABLET | Freq: Every day | ORAL | Status: DC

## 2013-05-01 MED ORDER — CEFTRIAXONE SODIUM 1 G IJ SOLR
1.0000 g | Freq: Once | INTRAMUSCULAR | Status: AC
  Administered 2013-05-01: 1 g via INTRAMUSCULAR
  Filled 2013-05-01 (×2): qty 10

## 2013-05-01 MED ORDER — PREDNISONE 50 MG PO TABS
60.0000 mg | ORAL_TABLET | Freq: Once | ORAL | Status: AC
  Administered 2013-05-01: 60 mg via ORAL
  Filled 2013-05-01: qty 1

## 2013-05-01 MED ORDER — LIDOCAINE-EPINEPHRINE (PF) 1 %-1:200000 IJ SOLN
INTRAMUSCULAR | Status: AC
  Filled 2013-05-01: qty 10

## 2013-05-01 MED ORDER — ALBUTEROL SULFATE (5 MG/ML) 0.5% IN NEBU
5.0000 mg | INHALATION_SOLUTION | Freq: Once | RESPIRATORY_TRACT | Status: AC
  Administered 2013-05-01: 5 mg via RESPIRATORY_TRACT
  Filled 2013-05-01: qty 1

## 2013-05-01 MED ORDER — LIDOCAINE HCL (PF) 1 % IJ SOLN
INTRAMUSCULAR | Status: AC
  Administered 2013-05-01: 2.1 mL
  Filled 2013-05-01: qty 5

## 2013-05-01 NOTE — ED Provider Notes (Signed)
History     This chart was scribed for [name] Lennert, MD, MD by Smitty Pluck, ED Scribe. The patient was seen in room APA11/[facility]11 and the patient's care was started at 4:56PM.  CSN: [id number] Arrival date & time 05/01/13  1517  Chief Complaint  Patient presents with  . Cough  . decreased appetite    Patient is a 62 y.o. male presenting with cough. The history is provided by the patient and medical records. No language interpreter was used.  Cough Cough characteristics:  Productive Sputum characteristics:  Nondescript Severity:  Moderate Onset quality:  Gradual Duration:  2 weeks Timing:  Constant Smoker: yes   Relieved by:  None tried Worsened by:  Nothing tried Ineffective treatments:  None tried Associated symptoms: no chest pain, no eye discharge, no headaches and no rash    HPI Comments: [name] [name] is a 62 y.o. male who presents to the Emergency Department complaining of constant, moderate productive cough onset 2 weeks ago. Pt states he stopped smoking 2 weeks ago. Pt denies fever, chills, nausea, vomiting, diarrhea, weakness, SOB and any other pain. He denies using O2 at home.    Past Medical History  Diagnosis Date  . Hypertension   . Seizures     last seizure 16 yrs ago.Epilepsy  . Head injuries     16 yrs ago  . Back pain     from fall 16 yrs ago-had 4 fx vertebrae   Past Surgical History  Procedure Laterality Date  . Skull fracture elevation      16 yrs ago  . Mandible fracture surgery      16 yrs ago  . Nose surgery      16 yrs ago with fall  . Hernia repair      right and laft inguinal  . Shoulder arthroscopy      left   . Brain surgery      16 yrs ago from fall  . Inguinal hernia repair  05/26/2011    Procedure: HERNIA REPAIR INGUINAL ADULT;  Surgeon: Dalia Heading;  Location: [facility];  Service: General;  Laterality: Right;  Recurrent Right Inguinal Hernia Repair with Mesh   Family History  Problem Relation Age of Onset  . Anesthesia  problems Neg Hx   . Hypotension Neg Hx   . Malignant hyperthermia Neg Hx   . Pseudochol deficiency Neg Hx    History  Substance Use Topics  . Smoking status: Current Every Day Smoker -- 1.50 packs/day for 40 years    Types: Cigarettes  . Smokeless tobacco: Not on file  . Alcohol Use: 1.2 oz/week    2 Cans of beer per week    Review of Systems  Constitutional: Negative for appetite change and fatigue.  HENT: Negative for congestion, sinus pressure and ear discharge.   Eyes: Negative for discharge.  Respiratory: Positive for cough.   Cardiovascular: Negative for chest pain.  Gastrointestinal: Negative for abdominal pain and diarrhea.  Genitourinary: Negative for frequency and hematuria.  Musculoskeletal: Negative for back pain.  Skin: Negative for rash.  Neurological: Negative for seizures and headaches.  Psychiatric/Behavioral: Negative for hallucinations.    Allergies  Review of patient's allergies indicates no known allergies.  Home Medications   Current Outpatient Rx  Name  Route  Sig  Dispense  Refill  . aspirin 325 MG buffered tablet   Oral   Take 325 mg by mouth at bedtime.           Marland Kitchen  atenolol (TENORMIN) 50 MG tablet   Oral   Take 50 mg by mouth daily.           . divalproex (DEPAKOTE) 500 MG EC tablet   Oral   Take 500 mg by mouth 2 (two) times daily.           . hydrochlorothiazide 25 MG tablet   Oral   Take 25 mg by mouth daily.           Marland Kitchen HYDROcodone-acetaminophen (NORCO) 5-325 MG per tablet      1 - 2 tablets po q4hrs prn pain   40 tablet   0    BP 141/89  Pulse 68  Temp(Src) 98.5 F (36.9 C) (Oral)  Resp 20  Ht 5\' 7"  (1.702 m)  Wt 170 lb (77.111 kg)  BMI 26.62 kg/m2  SpO2 89% Physical Exam  Nursing note and vitals reviewed. Constitutional: He is oriented to person, place, and time. He appears well-developed.  HENT:  Head: Normocephalic.  Eyes: Conjunctivae and EOM are normal. No scleral icterus.  Neck: Neck supple. No  thyromegaly present.  Cardiovascular: Normal rate and regular rhythm.  Exam reveals no gallop and no friction rub.   No murmur heard. Pulmonary/Chest: No stridor. He has wheezes (mild). He has no rales. He exhibits no tenderness.  Abdominal: He exhibits no distension. There is no tenderness. There is no rebound.  Musculoskeletal: Normal range of motion. He exhibits no edema.  Lymphadenopathy:    He has no cervical adenopathy.  Neurological: He is oriented to person, place, and time. Coordination normal.  Skin: No rash noted. No erythema.  Psychiatric: He has a normal mood and affect. His behavior is normal.    ED Course  Procedures (including critical care time) DIAGNOSTIC STUDIES: Oxygen Saturation is 89% on room air, normal by my interpretation.    COORDINATION OF CARE: 4:59 PM Discussed ED treatment with pt and pt agrees.  Medications  ipratropium (ATROVENT) nebulizer solution 0.5 mg (0.5 mg Nebulization Given 05/01/13 1704)  predniSONE (DELTASONE) tablet 60 mg (60 mg Oral Given 05/01/13 1706)  albuterol (PROVENTIL) (5 MG/ML) 0.5% nebulizer solution 5 mg (5 mg Nebulization Given 05/01/13 1704)    6:14 PM Recheck: Discussed lab results and treatment course with pt. Pt advised that he should be admitted due to O2 sats but pt refuses. Pt instructed to return if symptoms worsen.      Labs Reviewed  CBC WITH DIFFERENTIAL - Abnormal; Notable for the following:    WBC 20.6 (*)    MCH 34.1 (*)    MCHC 36.3 (*)    Neutrophils Relative % 83 (*)    Neutro Abs 17.1 (*)    Lymphocytes Relative 7 (*)    Monocytes Absolute 2.0 (*)    All other components within normal limits  BASIC METABOLIC PANEL - Abnormal; Notable for the following:    Sodium 130 (*)    Potassium 3.3 (*)    Chloride 87 (*)    CO2 34 (*)    Glucose, Bld 127 (*)    All other components within normal limits   Dg Chest 2 View  05/01/2013   *RADIOLOGY REPORT*  Clinical Data: Smoker with cough and fever.  CHEST - 2 VIEW   Comparison: None.  Findings: Normal sized heart.  Hyperexpanded lungs with diffuse peribronchial thickening and prominence of the interstitial markings.  Extensive left shoulder degenerative changes with fixation anchors.  Approximately 50% lower thoracic vertebral body compression deformity at the  T9 level.  IMPRESSION:  1.  Changes of COPD and chronic bronchitis. 2.  Probable old T9 vertebral compression fracture.   Original Report Authenticated By: Beckie Salts, M.D.   No diagnosis found.  MDM  Bronchitis and bronchospasm with leukocytosis and hypoxia.  Pt refused admisssion  The chart was scribed for me under my direct supervision.  I personally performed the history, physical, and medical decision making and all procedures in the evaluation of this patient.[name] Lennert, MD 05/01/13 Rickey Primus

## 2013-05-01 NOTE — ED Notes (Signed)
Patient with no complaints at this time. Respirations even and unlabored. Skin warm/dry. Discharge instructions reviewed with patient at this time. Patient given opportunity to voice concerns/ask questions. Patient discharged at this time and left Emergency Department with steady gait.   

## 2013-05-01 NOTE — ED Notes (Signed)
Reports productive cough x 2 wks, fever last week, denies this week.  Also reports not eating x 2 wks.  States has been drinking fluids.  Denies pain.

## 2014-11-19 DIAGNOSIS — I10 Essential (primary) hypertension: Secondary | ICD-10-CM | POA: Diagnosis not present

## 2014-11-19 DIAGNOSIS — G40909 Epilepsy, unspecified, not intractable, without status epilepticus: Secondary | ICD-10-CM | POA: Diagnosis not present

## 2014-11-19 DIAGNOSIS — Z23 Encounter for immunization: Secondary | ICD-10-CM | POA: Diagnosis not present

## 2014-11-19 DIAGNOSIS — J449 Chronic obstructive pulmonary disease, unspecified: Secondary | ICD-10-CM | POA: Diagnosis not present

## 2014-11-19 DIAGNOSIS — K458 Other specified abdominal hernia without obstruction or gangrene: Secondary | ICD-10-CM | POA: Diagnosis not present

## 2016-06-10 DIAGNOSIS — I10 Essential (primary) hypertension: Secondary | ICD-10-CM | POA: Diagnosis not present

## 2016-06-10 DIAGNOSIS — J449 Chronic obstructive pulmonary disease, unspecified: Secondary | ICD-10-CM | POA: Diagnosis not present

## 2016-06-10 DIAGNOSIS — I739 Peripheral vascular disease, unspecified: Secondary | ICD-10-CM | POA: Diagnosis not present

## 2016-06-14 ENCOUNTER — Encounter: Payer: Self-pay | Admitting: Internal Medicine

## 2016-07-01 ENCOUNTER — Other Ambulatory Visit: Payer: Self-pay | Admitting: Acute Care

## 2016-07-01 DIAGNOSIS — F17210 Nicotine dependence, cigarettes, uncomplicated: Secondary | ICD-10-CM

## 2016-07-08 ENCOUNTER — Encounter: Payer: Self-pay | Admitting: Gastroenterology

## 2016-07-08 ENCOUNTER — Other Ambulatory Visit: Payer: Self-pay

## 2016-07-08 ENCOUNTER — Ambulatory Visit (INDEPENDENT_AMBULATORY_CARE_PROVIDER_SITE_OTHER): Payer: Medicare Other | Admitting: Gastroenterology

## 2016-07-08 DIAGNOSIS — K648 Other hemorrhoids: Secondary | ICD-10-CM | POA: Diagnosis not present

## 2016-07-08 DIAGNOSIS — K649 Unspecified hemorrhoids: Secondary | ICD-10-CM

## 2016-07-08 DIAGNOSIS — K643 Fourth degree hemorrhoids: Secondary | ICD-10-CM

## 2016-07-08 NOTE — Patient Instructions (Addendum)
I have referred you to the surgeon.  You will need a colonoscopy at some point. We will see you in January to arrange this.  Hemorrhoids Hemorrhoids are swollen veins around the rectum or anus. There are two types of hemorrhoids:   Internal hemorrhoids. These occur in the veins just inside the rectum. They may poke through to the outside and become irritated and painful.  External hemorrhoids. These occur in the veins outside the anus and can be felt as a painful swelling or hard lump near the anus. CAUSES  Pregnancy.   Obesity.   Constipation or diarrhea.   Straining to have a bowel movement.   Sitting for long periods on the toilet.  Heavy lifting or other activity that caused you to strain.  Anal intercourse. SYMPTOMS   Pain.   Anal itching or irritation.   Rectal bleeding.   Fecal leakage.   Anal swelling.   One or more lumps around the anus.  DIAGNOSIS  Your caregiver may be able to diagnose hemorrhoids by visual examination. Other examinations or tests that may be performed include:   Examination of the rectal area with a gloved hand (digital rectal exam).   Examination of anal canal using a small tube (scope).   A blood test if you have lost a significant amount of blood.  A test to look inside the colon (sigmoidoscopy or colonoscopy). TREATMENT Most hemorrhoids can be treated at home. However, if symptoms do not seem to be getting better or if you have a lot of rectal bleeding, your caregiver may perform a procedure to help make the hemorrhoids get smaller or remove them completely. Possible treatments include:   Placing a rubber band at the base of the hemorrhoid to cut off the circulation (rubber band ligation).   Injecting a chemical to shrink the hemorrhoid (sclerotherapy).   Using a tool to burn the hemorrhoid (infrared light therapy).   Surgically removing the hemorrhoid (hemorrhoidectomy).   Stapling the hemorrhoid to block  blood flow to the tissue (hemorrhoid stapling).  HOME CARE INSTRUCTIONS   Eat foods with fiber, such as whole grains, beans, nuts, fruits, and vegetables. Ask your doctor about taking products with added fiber in them (fibersupplements).  Increase fluid intake. Drink enough water and fluids to keep your urine clear or pale yellow.   Exercise regularly.   Go to the bathroom when you have the urge to have a bowel movement. Do not wait.   Avoid straining to have bowel movements.   Keep the anal area dry and clean. Use wet toilet paper or moist towelettes after a bowel movement.   Medicated creams and suppositories may be used or applied as directed.   Only take over-the-counter or prescription medicines as directed by your caregiver.   Take warm sitz baths for 15-20 minutes, 3-4 times a day to ease pain and discomfort.   Place ice packs on the hemorrhoids if they are tender and swollen. Using ice packs between sitz baths may be helpful.   Put ice in a plastic bag.   Place a towel between your skin and the bag.   Leave the ice on for 15-20 minutes, 3-4 times a day.   Do not use a donut-shaped pillow or sit on the toilet for long periods. This increases blood pooling and pain.  SEEK MEDICAL CARE IF:  You have increasing pain and swelling that is not controlled by treatment or medicine.  You have uncontrolled bleeding.  You have difficulty or you  are unable to have a bowel movement.  You have pain or inflammation outside the area of the hemorrhoids. MAKE SURE YOU:  Understand these instructions.  Will watch your condition.  Will get help right away if you are not doing well or get worse.   This information is not intended to replace advice given to you by your health care provider. Make sure you discuss any questions you have with your health care provider.   Document Released: 10/15/2000 Document Revised: 10/04/2012 Document Reviewed: 08/22/2012 Elsevier  Interactive Patient Education [employer].

## 2016-07-08 NOTE — Progress Notes (Signed)
Primary Care Physician:  Alonza Bogus, MD Primary Gastroenterologist:  Dr. Gala Romney   Chief Complaint  Patient presents with  . Hemorrhoids    bright red blood at times,"passes feces at times"    HPI:   [name] [name] is a 65 y.o. male presenting today at the request of Dr. Luan Pulling secondary to hemorrhoids.    No prior colonoscopy.  Every time he has a bowel movement, his hemorrhoids come out. For the past 8 months, he will be standing there, and hemorrhoids go in and out and feces will pass but he thinks gas will be coming out. He feels like it is time to get them fixed. Occasional low-volume hematochezia per rectum but "no dripping". No abdominal pain. No weight loss or lack of appetite. No reflux symptoms.   Past Medical History:  Diagnosis Date  . Back pain    from fall 16 yrs ago-had 4 fx vertebrae  . Head injuries    16 yrs ago  . Hepatitis C    Treated in the past, states he is cured.   . Hypertension   . Seizures ([place])    last seizure 16 yrs ago.Epilepsy    Past Surgical History:  Procedure Laterality Date  . BRAIN SURGERY     16 yrs ago from fall  . HERNIA REPAIR     right and laft inguinal  . INGUINAL HERNIA REPAIR  05/26/2011   Procedure: HERNIA REPAIR INGUINAL ADULT;  Surgeon: Jamesetta So;  Location: [facility];  Service: General;  Laterality: Right;  Recurrent Right Inguinal Hernia Repair with Mesh  . MANDIBLE FRACTURE SURGERY     16 yrs ago  . NOSE SURGERY     16 yrs ago with fall  . SHOULDER ARTHROSCOPY     left   . SKULL FRACTURE ELEVATION     16 yrs ago    Current Outpatient Prescriptions  Medication Sig Dispense Refill  . amLODipine (NORVASC) 5 MG tablet Take 5 mg by mouth daily.    Marland Kitchen aspirin 325 MG buffered tablet Take 325 mg by mouth at bedtime.      Marland Kitchen atenolol (TENORMIN) 50 MG tablet Take 50 mg by mouth daily.      . Cholecalciferol (VITAMIN D3) 1000 units CAPS Take 1 capsule by mouth daily.    . divalproex (DEPAKOTE) 500 MG EC  tablet Take 500 mg by mouth 2 (two) times daily.      . Magnesium 250 MG TABS Take 1 tablet by mouth daily.    . Omega-3 Krill Oil 1000 MG CAPS Take 1 capsule by mouth 2 (two) times daily.    Marland Kitchen oxyCODONE (OXY IR/ROXICODONE) 5 MG immediate release tablet Take 2.5-5 mg by mouth daily as needed for pain.      No current facility-administered medications for this visit.     Allergies as of 07/08/2016  . (No Known Allergies)    Family History  Problem Relation Age of Onset  . Anesthesia problems Neg Hx   . Hypotension Neg Hx   . Malignant hyperthermia Neg Hx   . Pseudochol deficiency Neg Hx   . Colon cancer Neg Hx     Social History   Social History  . Marital status: Divorced    Spouse name: N/A  . Number of children: N/A  . Years of education: N/A   Occupational History  . Not on file.   Social History Main Topics  . Smoking status: Current Every Day  Smoker    Packs/day: 1.00    Years: 40.00    Types: Cigarettes  . Smokeless tobacco: Not on file  . Alcohol use 1.2 oz/week    2 Cans of beer per week     Comment: occasionally   . Drug use: No  . Sexual activity: Yes    Birth control/ protection: None   Other Topics Concern  . Not on file   Social History Narrative  . No narrative on file    Review of Systems: Gen: Denies any fever, chills, fatigue, weight loss, lack of appetite.  CV: Denies chest pain, heart palpitations, peripheral edema, syncope.  Resp: Denies shortness of breath at rest or with exertion. Denies wheezing or cough.  GI: see HPI  GU : Denies urinary burning, urinary frequency, urinary hesitancy MS: Denies joint pain, muscle weakness, cramps, or limitation of movement.  Derm: Denies rash, itching, dry skin Psych: Denies depression, anxiety, memory loss, and confusion Heme: see HPI   Physical Exam: BP (!) 156/84   Pulse 60   Temp 97.6 F (36.4 C) (Oral)   Ht 5\' 10"  (1.778 m)   Wt 159 lb 6.4 oz (72.3 kg)   BMI 22.87 kg/m  General:    Alert and oriented. Pleasant and cooperative. Well-nourished and well-developed.  Head:  Normocephalic and atraumatic. Eyes:  Without icterus, sclera clear and conjunctiva pink.  Ears:  Normal auditory acuity. Nose:  No deformity, discharge,  or lesions. Mouth:  No deformity or lesions, oral mucosa pink.  Lungs:  Clear to auscultation bilaterally. No wheezes, rales, or rhonchi. No distress.  Heart:  S1, S2 present without murmurs appreciated.  Abdomen:  +BS, soft, non-tender and non-distended. No HSM noted. No guarding or rebound. No masses appreciated.  Rectal:  Grade 4 prolapsed internal hemorrhoid, non-strangulated. Internal exam briefly, patient with some discomfort but not severe pain.  Msk:  Symmetrical without gross deformities. Normal posture. Extremities:  Without edema. Neurologic:  Alert and  oriented x4;  grossly normal neurologically. Psych:  Alert and cooperative. Normal mood and affect.

## 2016-07-08 NOTE — Assessment & Plan Note (Signed)
65 year old male with need for surgical referral. No evidence of strangulation on exam. Patient reports intermittent prolapsing of hemorrhoid, with improvement subjectively of hemorrhoid since this morning. No urgent need for surgical evaluation at time of office visit today, no acute pain, no evidence of strangulation. Continue supportive measures with cream, avoidance of straining, and will refer to General Surgery as this is beyond the realm of outpatient banding at this point. I discussed the need for initial screening colonoscopy at a later date, which he is agreeable to as well.   Referral to General Surgery Supportive measures Return in Jan 2018 to arrange

## 2016-07-08 NOTE — Progress Notes (Signed)
cc'ed to pcp °

## 2016-07-27 DIAGNOSIS — I70213 Atherosclerosis of native arteries of extremities with intermittent claudication, bilateral legs: Secondary | ICD-10-CM | POA: Diagnosis not present

## 2016-07-27 DIAGNOSIS — K623 Rectal prolapse: Secondary | ICD-10-CM | POA: Diagnosis not present

## 2016-07-29 NOTE — Patient Instructions (Signed)
[name] [name]  07/29/2016     @PREFPERIOPPHARMACY @   Your procedure is scheduled on  08/03/2016   Report to [name] at  730  A.M.  Call this number if you have problems the morning of surgery:  [phone]   Remember:  Do not eat food or drink liquids after midnight.  Take these medicines the morning of surgery with A SIP OF WATER  Amlodipine, atenolol, depakote, oxycodone.   Do not wear jewelry, make-up or nail polish.  Do not wear lotions, powders, or perfumes, or deoderant.  Do not shave 48 hours prior to surgery.  Men may shave face and neck.  Do not bring valuables to the hospital.  [facility] is not responsible for any belongings or valuables.  Contacts, dentures or bridgework may not be worn into surgery.  Leave your suitcase in the car.  After surgery it may be brought to your room.  For patients admitted to the hospital, discharge time will be determined by your treatment team.  Patients discharged the day of surgery will not be allowed to drive home.   Name and phone number of your driver:   family Special instructions:  none  Please read over the following fact sheets that you were given. Anesthesia Post-op Instructions and Care and Recovery After Surgery      Surgical Procedures for Hemorrhoids Surgical procedures can be used to treat hemorrhoids. Hemorrhoids are swollen veins that are inside the rectum (internal hemorrhoids) or around the anus (external hemorrhoids). They are caused by increased pressure in the anal area. This pressure may result from straining to have a bowel movement (constipation), diarrhea, pregnancy, obesity, anal sex, or sitting for long periods of time. Hemorrhoids can cause symptoms such as pain and bleeding. Surgery may be needed if diet changes, lifestyle changes, and other treatments do not help your symptoms. Various surgical methods may be used. Three common methods are:  Closed hemorrhoidectomy. The hemorrhoids are  surgically removed, and the surgical cuts (incisions) are closed with stitches (sutures).  Open hemorrhoidectomy. The hemorrhoids are surgically removed, but the incisions are allowed to heal without sutures.  Stapled hemorrhoidopexy. The hemorrhoids are removed using a device that takes out a ring of excess tissue. LET [facility] CARE PROVIDER KNOW ABOUT:  Any allergies you have.  All medicines you are taking, including vitamins, herbs, eye drops, creams, and over-the-counter medicines.  Previous problems you or members of your family have had with the use of anesthetics.  Any blood disorders you have.  Previous surgeries you have had.  Any medical conditions you have.  Whether you are pregnant or may be pregnant. RISKS AND COMPLICATIONS Generally, this is a safe procedure. However, problems may occur, including:  Infection.  Bleeding.  Allergic reactions to medicines.  Damage to other structures or organs.  Pain.  Constipation.  Difficulty passing urine.  Narrowing of the anal canal (stenosis).  Difficulty controlling bowel movements (incontinence). BEFORE THE PROCEDURE  Ask your health care provider about:  Changing or stopping your regular medicines. This is especially important if you are taking diabetes medicines or blood thinners.  Taking medicines such as aspirin and ibuprofen. These medicines can thin your blood. Do not take these medicines before your procedure if your health care provider instructs you not to.  You may need to have a procedure to examine the inside of your colon with a scope (colonoscopy). Your health care provider may do this to make sure  that there are no other causes for your bleeding or pain.  Follow instructions from your health care provider about eating or drinking restrictions.  You may be instructed to take a laxative and an enema to clean out your colon before surgery (bowel prep). Carefully follow instructions from your  health care provider about bowel prep.  Ask your health care provider how your surgical site will be marked or identified.  You may be given antibiotic medicine to help prevent infection.  Plan to have someone take you home after the procedure. PROCEDURE  To reduce your risk of infection:  Your health care team will wash or sanitize their hands.  Your skin will be washed with soap.  An IV tube will be inserted into one of your veins.  You will be given one or more of the following:  A medicine to help you relax (sedative).  A medicine to numb the area (local anesthetic).  A medicine to make you fall asleep (general anesthetic).  A medicine that is injected into an area of your body to numb everything below the injection site (regional anesthetic).  A lubricating jelly may be placed into your rectum.  Your surgeon will insert a short scope (anoscope) into your rectum to examine the hemorrhoids.  One of the following hemorrhoid procedures will be performed. Closed Hemorrhoidectomy  Your surgeon will use surgical instruments to open the tissue around the hemorrhoids.  The veins that supply the hemorrhoids will be tied off with a suture.  The hemorrhoids will be removed.  The tissue that surrounds the hemorrhoids will be closed with sutures that your body can absorb (absorbable sutures). Open Hemorrhoidectomy  The hemorrhoids will be removed with surgical instruments.  The incisions will be left open to heal without sutures. Stapled Hemorrhoidopexy  Your surgeon will use a circular stapling device to remove the hemorrhoids.  The device will be inserted into your anus. It will remove a circular ring of tissue that includes hemorrhoid tissue and some tissue above the hemorrhoids.  The staples in the device will close the edges of removed tissue. This will cut off the blood supply to the hemorrhoids and will pull any remaining hemorrhoids back into place. Each of these  procedures may vary among health care providers and hospitals. AFTER THE PROCEDURE  Your blood pressure, heart rate, breathing rate, and blood oxygen level will be monitored often until the medicines you were given have worn off.  You will be given pain medicine as needed.   This information is not intended to replace advice given to you by your health care provider. Make sure you discuss any questions you have with your health care provider.   Document Released: 08/15/2009 Document Revised: 07/09/2015 Document Reviewed: 01/13/2015 Elsevier Interactive Patient Education 2016 [employer] PATIENT INSTRUCTIONS POST-ANESTHESIA  IMMEDIATELY FOLLOWING SURGERY:  Do not drive or operate machinery for the first twenty four hours after surgery.  Do not make any important decisions for twenty four hours after surgery or while taking narcotic pain medications or sedatives.  If you develop intractable nausea and vomiting or a severe headache please notify your doctor immediately.  FOLLOW-UP:  Please make an appointment with your surgeon as instructed. You do not need to follow up with anesthesia unless specifically instructed to do so.  WOUND CARE INSTRUCTIONS (if applicable):  Keep a dry clean dressing on the anesthesia/puncture wound site if there is drainage.  Once the wound has quit draining you may leave it open to air.  Generally you should leave the bandage intact for twenty four hours unless there is drainage.  If the epidural site drains for more than 36-48 hours please call the anesthesia department.  QUESTIONS?:  Please feel free to call your physician or the hospital operator if you have any questions, and they will be happy to assist you.

## 2016-07-30 ENCOUNTER — Encounter (HOSPITAL_COMMUNITY)
Admission: RE | Admit: 2016-07-30 | Discharge: 2016-07-30 | Disposition: A | Discharge status: Home / Self Care | Payer: Medicare Other | Source: Ambulatory Visit | Attending: Surgery | Admitting: Surgery

## 2016-07-30 ENCOUNTER — Encounter (HOSPITAL_COMMUNITY): Payer: Self-pay

## 2016-08-02 ENCOUNTER — Telehealth: Payer: Self-pay

## 2016-08-02 NOTE — Telephone Encounter (Signed)
Pt has an office visit on 08/12/16 @ 10:00. LMOM about this appointment

## 2016-08-02 NOTE — Telephone Encounter (Signed)
Reviewed surgical note indicating need for colonoscopy. Please schedule patient for a follow-up visit with AB next week to schedule TCS (almost out of 30 day window), will place surgical note on her desk.

## 2016-08-02 NOTE — Telephone Encounter (Signed)
Pt called this morning and said that he saw the surgeon last week and was told that he needed a TCS ASAP. Pre Dr. Leilani Able note he talked with AB and pt needs TCS. Please advise

## 2016-08-03 ENCOUNTER — Ambulatory Visit: Admit: 2016-08-03 | Payer: Medicare Other | Admitting: Surgery

## 2016-08-03 SURGERY — HEMORRHOIDECTOMY
Anesthesia: General

## 2016-08-09 ENCOUNTER — Encounter: Payer: Self-pay | Admitting: Acute Care

## 2016-08-09 ENCOUNTER — Inpatient Hospital Stay: Admission: RE | Admit: 2016-08-09 | Payer: Self-pay | Source: Ambulatory Visit

## 2016-08-12 ENCOUNTER — Ambulatory Visit (INDEPENDENT_AMBULATORY_CARE_PROVIDER_SITE_OTHER): Payer: Medicare Other | Admitting: Gastroenterology

## 2016-08-12 ENCOUNTER — Encounter: Payer: Self-pay | Admitting: Gastroenterology

## 2016-08-12 ENCOUNTER — Encounter: Payer: Self-pay | Admitting: Internal Medicine

## 2016-08-12 ENCOUNTER — Other Ambulatory Visit: Payer: Self-pay

## 2016-08-12 VITALS — BP 153/85 | HR 57 | Temp 97.3°F | Ht 70.0 in | Wt 157.6 lb

## 2016-08-12 DIAGNOSIS — K625 Hemorrhage of anus and rectum: Secondary | ICD-10-CM | POA: Insufficient documentation

## 2016-08-12 DIAGNOSIS — Z8619 Personal history of other infectious and parasitic diseases: Secondary | ICD-10-CM | POA: Diagnosis not present

## 2016-08-12 MED ORDER — NA SULFATE-K SULFATE-MG SULF 17.5-3.13-1.6 GM/177ML PO SOLN: 1.0000 | ORAL | 0 refills | Status: DC

## 2016-08-12 NOTE — Progress Notes (Signed)
cc'ed to pcp °

## 2016-08-12 NOTE — Assessment & Plan Note (Addendum)
65 year old male without any prior colonoscopy, presenting with chronic, intermittent issues of fecal incontinence, low-volume hematochezia, possible rectal prolapse. Denies sitting for lengths of time, straining, or pain. Has established care with Dr. Rosana Hoes and will return to see him after the colonoscopy for further evaluation.   Proceed with TCS with Dr. Gala Romney in near future: the risks, benefits, and alternatives have been discussed with the patient in detail. The patient states understanding and desires to proceed. PROPOFOL due to polypharmacy and history of occasional alcohol use  Refer to Dr. Rosana Hoes if colonoscopy reveals hemorrhoids. If evidence of rectal prolapse, will need colorectal surgery referral.

## 2016-08-12 NOTE — Patient Instructions (Signed)
We have scheduled you for a colonoscopy with Dr. Gala Romney in the near future.  I will retrieve the biopsy reports from the liver test from 2003: you may need further evaluation in the future but we will see what it shows.   Return in 3 months.

## 2016-08-12 NOTE — Assessment & Plan Note (Signed)
Treated by Dr. Laural Golden in 2003 per patient. Appears he had a liver biopsy in 2003, but I do not have biopsy results from this. Will request from [facility]. Depending on fibrosis scores, may need further surveillance via ultrasound. I would also like to check Hep C RNA. He would like to hold off on this until he is seen at the next visit in 3 months.

## 2016-08-12 NOTE — Progress Notes (Signed)
Referring Provider: Sinda Du, MD Primary Care Physician:  Alonza Bogus, MD  Primary GI: Dr. Gala Romney   Chief Complaint  Patient presents with  . Colonoscopy    surgeon referred back for TCS  . Hemorrhoids    slight bleeding occassionally    HPI:   [name] [name] is a 65 y.o. male presenting today with a history of what is felt to be rectal prolapse, no prior colonoscopy, and long-standing rectal issues. He was seen by Dr. Rosana Hoes for evaluation; patient originally wanted to hold off on colonoscopy but a colonoscopy was recommended prior to any surgical intervention. He is willing to proceed with this now and presents to arrange this.   Occasional dripping of blood in toilet. Occasional fecal incontinence. Spontaneous resolution of rectal prolapse at times. With every BM will come out. No prior colonoscopy. Dr. Laural Golden treated Hepatitis C in remote past around 2003. He reports that around the 2nd month he had "no signs of it in my body". I do not have any records of this at this time. He does not want to pursue blood work for this currently but is willing to follow-up on this in the next few months.   Past Medical History:  Diagnosis Date  . Back pain    from fall 16 yrs ago-had 4 fx vertebrae  . Head injuries    16 yrs ago  . Hepatitis C    Treated in the past, states he is cured.   . Hypertension   . Seizures ([place])    last seizure 16 yrs ago.Epilepsy    Past Surgical History:  Procedure Laterality Date  . BRAIN SURGERY     16 yrs ago from fall  . HERNIA REPAIR     right and laft inguinal  . INGUINAL HERNIA REPAIR  05/26/2011   Procedure: HERNIA REPAIR INGUINAL ADULT;  Surgeon: Jamesetta So;  Location: [facility];  Service: General;  Laterality: Right;  Recurrent Right Inguinal Hernia Repair with Mesh  . MANDIBLE FRACTURE SURGERY     16 yrs ago  . NOSE SURGERY     16 yrs ago with fall  . SHOULDER ARTHROSCOPY     left   . SKULL FRACTURE ELEVATION     16 yrs ago      Current Outpatient Prescriptions  Medication Sig Dispense Refill  . amLODipine (NORVASC) 5 MG tablet Take 5 mg by mouth daily.    Marland Kitchen aspirin 325 MG buffered tablet Take 325 mg by mouth as needed. One -two times per week    . atenolol (TENORMIN) 50 MG tablet Take 50 mg by mouth daily.      . Cholecalciferol (VITAMIN D3) 1000 units CAPS Take 1 capsule by mouth daily.    . divalproex (DEPAKOTE) 500 MG EC tablet Take 500 mg by mouth 2 (two) times daily.      . Magnesium 250 MG TABS Take 1 tablet by mouth daily.    . Omega-3 Krill Oil 1000 MG CAPS Take 1 capsule by mouth 2 (two) times daily.    Marland Kitchen oxyCODONE (OXY IR/ROXICODONE) 5 MG immediate release tablet Take 2.5-5 mg by mouth daily as needed for pain.      No current facility-administered medications for this visit.     Allergies as of 08/12/2016  . (No Known Allergies)    Family History  Problem Relation Age of Onset  . Anesthesia problems Neg Hx   . Hypotension Neg Hx   . Malignant hyperthermia  Neg Hx   . Pseudochol deficiency Neg Hx   . Colon cancer Neg Hx     Social History   Social History  . Marital status: Divorced    Spouse name: N/A  . Number of children: N/A  . Years of education: N/A   Social History Main Topics  . Smoking status: Current Every Day Smoker    Packs/day: 1.00    Years: 40.00    Types: Cigarettes  . Smokeless tobacco: None  . Alcohol use 1.2 oz/week    2 Cans of beer per week     Comment: occasionally   . Drug use: No  . Sexual activity: Yes    Birth control/ protection: None   Other Topics Concern  . None   Social History Narrative  . None    Review of Systems: Gen: Denies fever, chills, anorexia. Denies fatigue, weakness, weight loss.  CV: Denies chest pain, palpitations, syncope, peripheral edema, and claudication. Resp: Denies dyspnea at rest, cough, wheezing, coughing up blood, and pleurisy. GI: see HPI  Derm: Denies rash, itching, dry skin Psych: Denies depression, anxiety,  memory loss, confusion. No homicidal or suicidal ideation.  Heme: Denies bruising, bleeding, and enlarged lymph nodes.  Physical Exam: BP (!) 153/85   Pulse (!) 57   Temp 97.3 F (36.3 C) (Oral)   Ht 5\' 10"  (1.778 m)   Wt 157 lb 9.6 oz (71.5 kg)   BMI 22.61 kg/m  General:   Alert and oriented. No distress noted. Pleasant and cooperative.  Head:  Normocephalic and atraumatic. Eyes:  Conjuctiva clear without scleral icterus. Mouth:  Oral mucosa pink and moist. Good dentition. No lesions. Heart:  S1, S2 present without murmurs, rubs, or gallops. Regular rate and rhythm. Abdomen:  +BS, soft, non-tender and non-distended. No rebound or guarding. No HSM or masses noted. Rectal: deferred until time of colonoscopy  Msk:  Symmetrical without gross deformities. Normal posture. Extremities:  Without edema. Neurologic:  Alert and  oriented x4;  grossly normal neurologically. Psych:  Alert and cooperative. Normal mood and affect.

## 2016-08-13 NOTE — Patient Instructions (Signed)
[name] [name]  08/13/2016     @PREFPERIOPPHARMACY @   Your procedure is scheduled on  08/23/2016 .  Report to [facility] at  830  A.M.  Call this number if you have problems the morning of surgery:  [phone]   Remember:  Do not eat food or drink liquids after midnight.  Take these medicines the morning of surgery with A SIP OF WATER  Norvasc, atenolol, depakote, oxycodone.   Do not wear jewelry, make-up or nail polish.  Do not wear lotions, powders, or perfumes, or deoderant.  Do not shave 48 hours prior to surgery.  Men may shave face and neck.  Do not bring valuables to the hospital.  [facility] is not responsible for any belongings or valuables.  Contacts, dentures or bridgework may not be worn into surgery.  Leave your suitcase in the car.  After surgery it may be brought to your room.  For patients admitted to the hospital, discharge time will be determined by your treatment team.  Patients discharged the day of surgery will not be allowed to drive home.   Name and phone number of your driver:   family Special instructions:  Follow the diet and prep instructions given to you by Dr [name] office.  Please read over the following fact sheets that you were given. Anesthesia Post-op Instructions and Care and Recovery After Surgery       Colonoscopy A colonoscopy is an exam to look at the entire large intestine (colon). This exam can help find problems such as tumors, polyps, inflammation, and areas of bleeding. The exam takes about 1 hour.  LET [facility] CARE PROVIDER KNOW ABOUT:   Any allergies you have.  All medicines you are taking, including vitamins, herbs, eye drops, creams, and over-the-counter medicines.  Previous problems you or members of your family have had with the use of anesthetics.  Any blood disorders you have.  Previous surgeries you have had.  Medical conditions you have. RISKS AND COMPLICATIONS  Generally, this is  a safe procedure. However, as with any procedure, complications can occur. Possible complications include:  Bleeding.  Tearing or rupture of the colon wall.  Reaction to medicines given during the exam.  Infection (rare). BEFORE THE PROCEDURE   Ask your health care provider about changing or stopping your regular medicines.  You may be prescribed an oral bowel prep. This involves drinking a large amount of medicated liquid, starting the day before your procedure. The liquid will cause you to have multiple loose stools until your stool is almost clear or light green. This cleans out your colon in preparation for the procedure.  Do not eat or drink anything else once you have started the bowel prep, unless your health care provider tells you it is safe to do so.  Arrange for someone to drive you home after the procedure. PROCEDURE   You will be given medicine to help you relax (sedative).  You will lie on your side with your knees bent.  A long, flexible tube with a light and camera on the end (colonoscope) will be inserted through the rectum and into the colon. The camera sends video back to a computer screen as it moves through the colon. The colonoscope also releases carbon dioxide gas to inflate the colon. This helps your health care provider see the area better.  During the exam, your health care provider may take a small tissue  sample (biopsy) to be examined under a microscope if any abnormalities are found.  The exam is finished when the entire colon has been viewed. AFTER THE PROCEDURE   Do not drive for 24 hours after the exam.  You may have a small amount of blood in your stool.  You may pass moderate amounts of gas and have mild abdominal cramping or bloating. This is caused by the gas used to inflate your colon during the exam.  Ask when your test results will be ready and how you will get your results. Make sure you get your test results.   This information is not  intended to replace advice given to you by your health care provider. Make sure you discuss any questions you have with your health care provider.   Document Released: 10/15/2000 Document Revised: 08/08/2013 Document Reviewed: 06/25/2013 Elsevier Interactive Patient Education 2016 [employer] Colonoscopy, Care After Refer to this sheet in the next few weeks. These instructions provide you with information on caring for yourself after your procedure. Your health care provider may also give you more specific instructions. Your treatment has been planned according to current medical practices, but problems sometimes occur. Call your health care provider if you have any problems or questions after your procedure. WHAT TO EXPECT AFTER THE PROCEDURE  After your procedure, it is typical to have the following:  A small amount of blood in your stool.  Moderate amounts of gas and mild abdominal cramping or bloating. HOME CARE INSTRUCTIONS  Do not drive, operate machinery, or sign important documents for 24 hours.  You may shower and resume your regular physical activities, but move at a slower pace for the first 24 hours.  Take frequent rest periods for the first 24 hours.  Walk around or put a warm pack on your abdomen to help reduce abdominal cramping and bloating.  Drink enough fluids to keep your urine clear or pale yellow.  You may resume your normal diet as instructed by your health care provider. Avoid heavy or fried foods that are hard to digest.  Avoid drinking alcohol for 24 hours or as instructed by your health care provider.  Only take over-the-counter or prescription medicines as directed by your health care provider.  If a tissue sample (biopsy) was taken during your procedure:  Do not take aspirin or blood thinners for 7 days, or as instructed by your health care provider.  Do not drink alcohol for 7 days, or as instructed by your health care provider.  Eat soft foods for  the first 24 hours. SEEK MEDICAL CARE IF: You have persistent spotting of blood in your stool 2-3 days after the procedure. SEEK IMMEDIATE MEDICAL CARE IF:  You have more than a small spotting of blood in your stool.  You pass large blood clots in your stool.  Your abdomen is swollen (distended).  You have nausea or vomiting.  You have a fever.  You have increasing abdominal pain that is not relieved with medicine.   This information is not intended to replace advice given to you by your health care provider. Make sure you discuss any questions you have with your health care provider.   Document Released: 06/01/2004 Document Revised: 08/08/2013 Document Reviewed: 06/25/2013 Elsevier Interactive Patient Education 2016 [place] Monitored anesthesia care is an anesthesia service for a medical procedure. Anesthesia is the loss of the ability to feel pain. It is produced by medicines called anesthetics. It may affect a small  area of your body (local anesthesia), a large area of your body (regional anesthesia), or your entire body (general anesthesia). The need for monitored anesthesia care depends your procedure, your condition, and the potential need for regional or general anesthesia. It is often provided during procedures where:   General anesthesia may be needed if there are complications. This is because you need special care when you are under general anesthesia.   You will be under local or regional anesthesia. This is so that you are able to have higher levels of anesthesia if needed.   You will receive calming medicines (sedatives). This is especially the case if sedatives are given to put you in a semi-conscious state of relaxation (deep sedation). This is because the amount of sedative needed to produce this state can be hard to predict. Too much of a sedative can produce general anesthesia. Monitored anesthesia care is performed by one or more  health care providers who have special training in all types of anesthesia. You will need to meet with these health care providers before your procedure. During this meeting, they will ask you about your medical history. They will also give you instructions to follow. (For example, you will need to stop eating and drinking before your procedure. You may also need to stop or change medicines you are taking.) During your procedure, your health care providers will stay with you. They will:   Watch your condition. This includes watching your blood pressure, breathing, and level of pain.   Diagnose and treat problems that occur.   Give medicines if they are needed. These may include calming medicines (sedatives) and anesthetics.   Make sure you are comfortable.  Having monitored anesthesia care does not necessarily mean that you will be under anesthesia. It does mean that your health care providers will be able to manage anesthesia if you need it or if it occurs. It also means that you will be able to have a different type of anesthesia than you are having if you need it. When your procedure is complete, your health care providers will continue to watch your condition. They will make sure any medicines wear off before you are allowed to go home.    This information is not intended to replace advice given to you by your health care provider. Make sure you discuss any questions you have with your health care provider.   Document Released: 07/14/2005 Document Revised: 11/08/2014 Document Reviewed: 11/29/2012 Elsevier Interactive Patient Education 2016 Elsevier Inc[employer] PATIENT INSTRUCTIONS POST-ANESTHESIA  IMMEDIATELY FOLLOWING SURGERY:  Do not drive or operate machinery for the first twenty four hours after surgery.  Do not make any important decisions for twenty four hours after surgery or while taking narcotic pain medications or sedatives.  If you develop intractable nausea and vomiting or a severe  headache please notify your doctor immediately.  FOLLOW-UP:  Please make an appointment with your surgeon as instructed. You do not need to follow up with anesthesia unless specifically instructed to do so.  WOUND CARE INSTRUCTIONS (if applicable):  Keep a dry clean dressing on the anesthesia/puncture wound site if there is drainage.  Once the wound has quit draining you may leave it open to air.  Generally you should leave the bandage intact for twenty four hours unless there is drainage.  If the epidural site drains for more than 36-48 hours please call the anesthesia department.  QUESTIONS?:  Please feel free to call your physician or the hospital operator if you  have any questions, and they will be happy to assist you.

## 2016-08-16 ENCOUNTER — Encounter (HOSPITAL_COMMUNITY): Payer: Self-pay

## 2016-08-19 ENCOUNTER — Encounter (HOSPITAL_COMMUNITY)
Admission: RE | Admit: 2016-08-19 | Discharge: 2016-08-19 | Disposition: A | Discharge status: Home / Self Care | Payer: Medicare Other | Source: Ambulatory Visit | Attending: Internal Medicine | Admitting: Internal Medicine

## 2016-08-19 ENCOUNTER — Other Ambulatory Visit: Payer: Self-pay

## 2016-08-19 ENCOUNTER — Encounter (HOSPITAL_COMMUNITY): Payer: Self-pay

## 2016-08-19 DIAGNOSIS — Z01810 Encounter for preprocedural cardiovascular examination: Secondary | ICD-10-CM | POA: Diagnosis present

## 2016-08-19 DIAGNOSIS — Z01812 Encounter for preprocedural laboratory examination: Secondary | ICD-10-CM | POA: Diagnosis present

## 2016-08-19 LAB — BASIC METABOLIC PANEL
Anion gap: 7 (ref 5–15)
BUN: 10 mg/dL (ref 6–20)
CALCIUM: 9.4 mg/dL (ref 8.9–10.3)
CO2: 30 mmol/L (ref 22–32)
CREATININE: 0.79 mg/dL (ref 0.61–1.24)
Chloride: 100 mmol/L — ABNORMAL LOW (ref 101–111)
GFR calc non Af Amer: >60 mL/min (ref >60)
Glucose, Bld: 109 mg/dL — ABNORMAL HIGH (ref 65–99)
Potassium: 3.6 mmol/L (ref 3.5–5.1)
Sodium: 137 mmol/L (ref 135–145)

## 2016-08-19 LAB — CBC WITH DIFFERENTIAL/PLATELET
BASOS PCT: 1 %
Basophils Absolute: 0 10*3/uL (ref 0.0–0.1)
EOS ABS: 0.1 10*3/uL (ref 0.0–0.7)
Eosinophils Relative: 1 %
HCT: 43.2 % (ref 39.0–52.0)
HEMOGLOBIN: 15 g/dL (ref 13.0–17.0)
Lymphocytes Relative: 33 %
Lymphs Abs: 1.9 10*3/uL (ref 0.7–4.0)
MCH: 33.6 pg (ref 26.0–34.0)
MCHC: 34.7 g/dL (ref 30.0–36.0)
MCV: 96.6 fL (ref 78.0–100.0)
MONO ABS: 0.6 10*3/uL (ref 0.1–1.0)
MONOS PCT: 11 %
NEUTROS PCT: 54 %
Neutro Abs: 3.1 10*3/uL (ref 1.7–7.7)
PLATELETS: 156 10*3/uL (ref 150–400)
RBC: 4.47 MIL/uL (ref 4.22–5.81)
RDW: 12.9 % (ref 11.5–15.5)
WBC: 5.7 10*3/uL (ref 4.0–10.5)

## 2016-08-23 ENCOUNTER — Encounter (HOSPITAL_COMMUNITY): Payer: Self-pay

## 2016-08-23 ENCOUNTER — Telehealth: Payer: Self-pay | Admitting: Gastroenterology

## 2016-08-23 ENCOUNTER — Ambulatory Visit (HOSPITAL_COMMUNITY)
Admission: RE | Admit: 2016-08-23 | Discharge: 2016-08-23 | Disposition: A | Discharge status: Home / Self Care | Payer: Medicare Other | Source: Ambulatory Visit | Attending: Internal Medicine | Admitting: Internal Medicine

## 2016-08-23 ENCOUNTER — Encounter (HOSPITAL_COMMUNITY)
Admission: RE | Disposition: A | Discharge status: Home / Self Care | Payer: Self-pay | Source: Ambulatory Visit | Attending: Internal Medicine

## 2016-08-23 ENCOUNTER — Ambulatory Visit (HOSPITAL_COMMUNITY): Payer: Medicare Other | Admitting: Anesthesiology

## 2016-08-23 DIAGNOSIS — J449 Chronic obstructive pulmonary disease, unspecified: Secondary | ICD-10-CM | POA: Insufficient documentation

## 2016-08-23 DIAGNOSIS — D128 Benign neoplasm of rectum: Secondary | ICD-10-CM | POA: Insufficient documentation

## 2016-08-23 DIAGNOSIS — K635 Polyp of colon: Secondary | ICD-10-CM | POA: Diagnosis not present

## 2016-08-23 DIAGNOSIS — Z79899 Other long term (current) drug therapy: Secondary | ICD-10-CM | POA: Insufficient documentation

## 2016-08-23 DIAGNOSIS — K921 Melena: Secondary | ICD-10-CM | POA: Diagnosis not present

## 2016-08-23 DIAGNOSIS — D122 Benign neoplasm of ascending colon: Secondary | ICD-10-CM | POA: Diagnosis not present

## 2016-08-23 DIAGNOSIS — G40909 Epilepsy, unspecified, not intractable, without status epilepticus: Secondary | ICD-10-CM | POA: Insufficient documentation

## 2016-08-23 DIAGNOSIS — Z7982 Long term (current) use of aspirin: Secondary | ICD-10-CM | POA: Diagnosis not present

## 2016-08-23 DIAGNOSIS — K621 Rectal polyp: Secondary | ICD-10-CM | POA: Diagnosis not present

## 2016-08-23 DIAGNOSIS — K648 Other hemorrhoids: Secondary | ICD-10-CM | POA: Insufficient documentation

## 2016-08-23 DIAGNOSIS — F17210 Nicotine dependence, cigarettes, uncomplicated: Secondary | ICD-10-CM | POA: Diagnosis not present

## 2016-08-23 DIAGNOSIS — K625 Hemorrhage of anus and rectum: Secondary | ICD-10-CM

## 2016-08-23 DIAGNOSIS — I10 Essential (primary) hypertension: Secondary | ICD-10-CM | POA: Insufficient documentation

## 2016-08-23 DIAGNOSIS — K623 Rectal prolapse: Secondary | ICD-10-CM | POA: Diagnosis not present

## 2016-08-23 HISTORY — PX: POLYPECTOMY: SHX5525

## 2016-08-23 HISTORY — PX: COLONOSCOPY WITH PROPOFOL: SHX5780

## 2016-08-23 LAB — GLUCOSE, CAPILLARY
GLUCOSE-CAPILLARY: 106 mg/dL — AB (ref 65–99)
Glucose-Capillary: 103 mg/dL — ABNORMAL HIGH (ref 65–99)

## 2016-08-23 SURGERY — COLONOSCOPY WITH PROPOFOL
Anesthesia: Monitor Anesthesia Care

## 2016-08-23 MED ORDER — PROPOFOL 500 MG/50ML IV EMUL
INTRAVENOUS | Status: DC | PRN
  Administered 2016-08-23: 150 ug/kg/min via INTRAVENOUS

## 2016-08-23 MED ORDER — MIDAZOLAM HCL 5 MG/5ML IJ SOLN
INTRAMUSCULAR | Status: DC | PRN
  Administered 2016-08-23: 2 mg via INTRAVENOUS

## 2016-08-23 MED ORDER — PROPOFOL 10 MG/ML IV BOLUS
INTRAVENOUS | Status: AC
  Filled 2016-08-23: qty 20

## 2016-08-23 MED ORDER — MIDAZOLAM HCL 2 MG/2ML IJ SOLN
1.0000 mg | INTRAMUSCULAR | Status: DC | PRN
  Administered 2016-08-23: 2 mg via INTRAVENOUS
  Filled 2016-08-23: qty 2

## 2016-08-23 MED ORDER — MIDAZOLAM HCL 2 MG/2ML IJ SOLN
INTRAMUSCULAR | Status: AC
  Filled 2016-08-23: qty 2

## 2016-08-23 MED ORDER — HYDROMORPHONE HCL 1 MG/ML IJ SOLN: 0.2500 mg | INTRAMUSCULAR | Status: DC | PRN

## 2016-08-23 MED ORDER — FENTANYL CITRATE (PF) 100 MCG/2ML IJ SOLN
25.0000 ug | INTRAMUSCULAR | Status: AC | PRN
  Administered 2016-08-23 (×2): 25 ug via INTRAVENOUS

## 2016-08-23 MED ORDER — FENTANYL CITRATE (PF) 100 MCG/2ML IJ SOLN
INTRAMUSCULAR | Status: AC
  Filled 2016-08-23: qty 2

## 2016-08-23 MED ORDER — CHLORHEXIDINE GLUCONATE CLOTH 2 % EX PADS: 6.0000 | MEDICATED_PAD | Freq: Once | CUTANEOUS | Status: DC

## 2016-08-23 MED ORDER — LACTATED RINGERS IV SOLN
INTRAVENOUS | Status: DC
  Administered 2016-08-23: 10:00:00 via INTRAVENOUS

## 2016-08-23 NOTE — Discharge Instructions (Signed)
°  Colonoscopy Discharge Instructions  Read the instructions outlined below and refer to this sheet in the next few weeks. These discharge instructions provide you with general information on caring for yourself after you leave the hospital. Your doctor may also give you specific instructions. While your treatment has been planned according to the most current medical practices available, unavoidable complications occasionally occur. If you have any problems or questions after discharge, call Dr. Gala Romney at [phone]. ACTIVITY  You may resume your regular activity, but move at a slower pace for the next 24 hours.   Take frequent rest periods for the next 24 hours.   Walking will help get rid of the air and reduce the bloated feeling in your belly (abdomen).   No driving for 24 hours (because of the medicine (anesthesia) used during the test).    Do not sign any important legal documents or operate any machinery for 24 hours (because of the anesthesia used during the test).  NUTRITION  Drink plenty of fluids.   You may resume your normal diet as instructed by your doctor.   Begin with a light meal and progress to your normal diet. Heavy or fried foods are harder to digest and may make you feel sick to your stomach (nauseated).   Avoid alcoholic beverages for 24 hours or as instructed.  MEDICATIONS  You may resume your normal medications unless your doctor tells you otherwise.  WHAT YOU CAN EXPECT TODAY  Some feelings of bloating in the abdomen.   Passage of more gas than usual.   Spotting of blood in your stool or on the toilet paper.  IF YOU HAD POLYPS REMOVED DURING THE COLONOSCOPY:  No aspirin products for 7 days or as instructed.   No alcohol for 7 days or as instructed.   Eat a soft diet for the next 24 hours.  FINDING OUT THE RESULTS OF YOUR TEST Not all test results are available during your visit. If your test results are not back during the visit, make an appointment  with your caregiver to find out the results. Do not assume everything is normal if you have not heard from your caregiver or the medical facility. It is important for you to follow up on all of your test results.  SEEK IMMEDIATE MEDICAL ATTENTION IF:  You have more than a spotting of blood in your stool.   Your belly is swollen (abdominal distention).   You are nauseated or vomiting.   You have a temperature over 101.   You have abdominal pain or discomfort that is severe or gets worse throughout the day.     You had multiple colon polyps.  Polyps removed.  Your rectum is partially prolapsing out of your anus. You will likely need an operation to get this fixed.  Plan to follow-up with Dr. Rosana Hoes.  I'll be in touch with you when I get the biopsy report back on the polyps.

## 2016-08-23 NOTE — Interval H&P Note (Signed)
History and Physical Interval Note:  08/23/2016 10:12 AM  [name] [name]  has presented today for surgery, with the diagnosis of RECTAL BLEEDING  The various methods of treatment have been discussed with the patient and family. After consideration of risks, benefits and other options for treatment, the patient has consented to  Procedure(s) with comments: COLONOSCOPY WITH PROPOFOL (N/A) - 1000 as a surgical intervention .  The patient's history has been reviewed, patient examined, no change in status, stable for surgery.  I have reviewed the patient's chart and labs.  Questions were answered to the patient's satisfaction.     No change. Diagnostic colonoscopy plan.  The risks, benefits, limitations, alternatives and imponderables have been reviewed with the patient. Questions have been answered. All parties are agreeable.   Manus Rudd

## 2016-08-23 NOTE — Transfer of Care (Signed)
Immediate Anesthesia Transfer of Care Note  Patient: [name] [name]  Procedure(s) Performed: Procedure(s) with comments: COLONOSCOPY WITH PROPOFOL (N/A) - 1000 POLYPECTOMY - colon  Patient Location: PACU  Anesthesia Type:MAC  Level of Consciousness: sedated  Airway & Oxygen Therapy: Patient Spontanous Breathing and Patient connected to face mask oxygen  Post-op Assessment: Report given to RN and Post -op Vital signs reviewed and stable  Post vital signs: Reviewed and stable  Last Vitals:  Vitals:   08/23/16 0932  Pulse: 65  Resp: (!) 22  Temp: 36.5 C    Last Pain:  Vitals:   08/23/16 0932  TempSrc: Oral      Patients Stated Pain Goal: 4 ([id number])  Complications: No apparent anesthesia complications

## 2016-08-23 NOTE — Anesthesia Postprocedure Evaluation (Signed)
Anesthesia Post Note  Patient: [name] [name]  Procedure(s) Performed: Procedure(s) (LRB): COLONOSCOPY WITH PROPOFOL (N/A) POLYPECTOMY  Patient location during evaluation: PACU Anesthesia Type: MAC Level of consciousness: awake and alert, oriented and patient cooperative Pain management: pain level controlled Vital Signs Assessment: post-procedure vital signs reviewed and stable Respiratory status: spontaneous breathing, nonlabored ventilation and respiratory function stable Cardiovascular status: blood pressure returned to baseline and stable Postop Assessment: no signs of nausea or vomiting Anesthetic complications: no    Last Vitals:  Vitals:   08/23/16 1100 08/23/16 1105  BP: 104/65 106/62  Pulse: 62 61  Resp: 11 17  Temp:      Last Pain:  Vitals:   08/23/16 0932  TempSrc: Oral                 Durwood Dittus J
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
## 2016-08-23 NOTE — Telephone Encounter (Signed)
Please cancel follow-up appt with Dr. Rosana Hoes. He will need to be referred to colorectal surgeon (in [facility]) for rectal prolapse repair per Dr. Rosana Hoes.    Annitta Needs, ANP-BC [facility]
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
## 2016-08-23 NOTE — Op Note (Signed)
[facility] Patient Name: [name] [name] Procedure Date: 08/23/2016 10:19 AM MRN: [id number] Date of Birth: 06/03/1951 Attending MD: Norvel Richards , MD CSN: [id number] Age: 65 Admit Type: Outpatient Procedure:                Colonoscopy with multiple snare polypectomies Indications:              Hematochezia Providers:                Norvel Richards, MD, Otis Peak B. Sharon Seller, RN,                            Isabella Stalling, Technician Referring MD:              Medicines:                Propofol per Anesthesia Complications:            No immediate complications. Estimated Blood Loss:     Estimated blood loss: none. Procedure:                Pre-Anesthesia Assessment:                           - Prior to the procedure, a History and Physical                            was performed, and patient medications and                            allergies were reviewed. The patient's tolerance of                            previous anesthesia was also reviewed. The risks                            and benefits of the procedure and the sedation                            options and risks were discussed with the patient.                            All questions were answered, and informed consent                            was obtained. Prior Anticoagulants: The patient has                            taken no previous anticoagulant or antiplatelet                            agents. ASA Grade Assessment: III - A patient with                            severe systemic disease. After reviewing the risks  and benefits, the patient was deemed in                            satisfactory condition to undergo the procedure.                           After obtaining informed consent, the colonoscope                            was passed under direct vision. Throughout the                            procedure, the patient's blood pressure, pulse, and              oxygen saturations were monitored continuously. The                            [id number] scope was introduced through                            the anus and advanced to the the cecum, identified                            by appendiceal orifice and ileocecal valve. The                            colonoscopy was performed without difficulty. The                            patient tolerated the procedure well. The quality                            of the bowel preparation was adequate. The                            ileocecal valve, appendiceal orifice, and rectum                            were photographed. The entire colon was well                            visualized. Scope In: 10:30:07 AM Scope Out: 10:49:57 AM Scope Withdrawal Time: 0 hours 19 minutes 46 seconds  Total Procedure Duration: 0 hours 19 minutes 50 seconds  Findings:      The perianal and digital rectal examination revealed small amount of       prolapsing rectal mucosa which was easily reduced. Examination of the       rectal and sigmoid mucosa revealed multiple areas of polypoid mucosa       with 2-5 mm appearing hyperplastic appearing polyps. Patient had       relatively minor appearing internal hemorrhoids. There was (1) 1 cm       pedunculated polyp at the rectosigmoid junction. Please see photos. The       patient had a single 7 mm pedunculated polyp in ascending segment;  otherwise, the remainder of the colonic mucosa appeared normal. 2 of the       numerous areas of polypoid mucosa in the rectom were cold snared. The       larger polyp in the rectosigmoid was removed with hot snare technique.       The ascending colon polyp removed with cold snare technique. Impression:               - Rectal prolapse. Multiple colonic polyps. Removed                            as described above. Polypoid rectal mucosa sampled                            as described above. Moderate Sedation:       Moderate (conscious) sedation was personally administered by an       anesthesia professional. The following parameters were monitored: oxygen       saturation, heart rate, blood pressure, respiratory rate, EKG, adequacy       of pulmonary ventilation, and response to care. Total physician       intraservice time was 34 minutes. Recommendation:           - Patient has a contact number available for                            emergencies. The signs and symptoms of potential                            delayed complications were discussed with the                            patient. Return to normal activities tomorrow.                            Written discharge instructions were provided to the                            patient.                           - Resume regular diet.                           - Continue present medications.                           - Repeat colonoscopy date to be determined after                            pending pathology results are reviewed for                            surveillance based on pathology results.                           - Return to referring physician after studies are  complete. Plan on following up with Dr. Rosana Hoes for                            definitive repair of the rectal prolapse. Procedure Code(s):        --- Professional ---                           [phone], Colonoscopy, flexible; diagnostic, including                            collection of specimen(s) by brushing or washing,                            when performed (separate procedure) Diagnosis Code(s):        --- Professional ---                           K92.1, Melena (includes Hematochezia) CPT copyright 2016 [employer]. All rights reserved. The codes documented in this report are preliminary and upon coder review may  be revised to meet current compliance requirements. Cristopher Estimable. Rourk, MD Norvel Richards, MD 08/23/2016  11:08:23 AM This report has been signed electronically. Number of Addenda: 0

## 2016-08-24 ENCOUNTER — Encounter (HOSPITAL_COMMUNITY): Payer: Self-pay | Admitting: Internal Medicine

## 2016-08-24 ENCOUNTER — Other Ambulatory Visit: Payer: Self-pay

## 2016-08-24 DIAGNOSIS — K623 Rectal prolapse: Secondary | ICD-10-CM

## 2016-08-24 NOTE — Telephone Encounter (Signed)
Pt  Has an appointment on 09/08/16 @ 11:00 with Dr.Gross at [facility]. He is asking if he needs to wait until the path comes back to see what it says about. He is not sure why he is going to see the surgeon

## 2016-08-24 NOTE — Telephone Encounter (Signed)
Referral has been made to [facility].   

## 2016-08-26 NOTE — Telephone Encounter (Signed)
Pt understands and all questions answered

## 2016-08-26 NOTE — Telephone Encounter (Signed)
He was referred to surgeon due to rectal prolapse. Dr. Rosana Hoes and Dr. Arnoldo Morale do not deal with rectal prolapse (according to Dr. Rosana Hoes).   Path with tubular adenomas. No contraindication to proceed as planned if he desires rectal prolapse repair.

## 2016-08-27 ENCOUNTER — Telehealth: Payer: Self-pay | Admitting: Internal Medicine

## 2016-08-27 ENCOUNTER — Encounter: Payer: Self-pay | Admitting: Internal Medicine

## 2016-08-27 NOTE — Telephone Encounter (Signed)
Patient called this afternoon. Has noted some right groin pain today. No change in bowel habits been having normal bowel movements. No bleeding. Patient really denies any abdominal pain. Colonoscopy with multiple snare polypectomies performed 4 days earlier. Felt good until today as he reports. In fact, doesn't feel bad today just has some right groin pain -  thought was strange.  Procedure note reviewed. Patient had multiple adenomas and hyperplastic polyp removed. Also noted to have rectal prolapse. I told him I did not think he had a complication from the colonoscopy necessarily. I would observe for the next 24 hours if symptoms get worse or no improvement between now and tomorrow, patient is to call me back.  I gave him the pathology results over the phone and I'm sending him a letter. He has an appointment to see Dr. Johney Maine

## 2016-09-08 ENCOUNTER — Encounter: Payer: Self-pay | Admitting: Surgery

## 2016-09-08 DIAGNOSIS — Z720 Tobacco use: Secondary | ICD-10-CM | POA: Insufficient documentation

## 2016-09-08 DIAGNOSIS — K623 Rectal prolapse: Secondary | ICD-10-CM | POA: Insufficient documentation

## 2016-09-10 ENCOUNTER — Other Ambulatory Visit (HOSPITAL_COMMUNITY): Payer: Self-pay | Admitting: Surgery

## 2016-09-10 DIAGNOSIS — K623 Rectal prolapse: Secondary | ICD-10-CM

## 2016-09-14 NOTE — Progress Notes (Signed)
Cardiology Office Note   Date:  09/15/2016   ID:  [name] [name], DOB February 10, 1951, MRN [id number]  PCP:  Alonza Bogus, MD  Cardiologist:   Jenkins Rouge, MD   No chief complaint on file.     History of Present Illness: [name] [name] is a 66 y.o. male who presents for pre op clearance. Needs rectal prolapse repair under general Anesthesia.  This would involve rectosigmoid resection Dr Johney Maine concerned about smoking history and fair walking tolerance and would like to know how his heart function is in terms of planning surgical repair. Smokes 1/2 ppd  No history of CAD, vascular Dx, valve Dx, CHF or arrhythmia.  Does take beta blocker and norvasc for HTN He only Takes a baby aspirin as blood thinner.   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 Back pain    from fall 16 yrs ago-had 4 fx vertebrae  . Head injuries    16 yrs ago  . Hepatitis C    Treated in the past, states he is cured.   . Hypertension   . Seizures ([place])    last seizure  25 yrs ago.Epilepsy    Past Surgical History:  Procedure Laterality Date  . BRAIN SURGERY      20 + yrs ago from fall  . COLONOSCOPY WITH PROPOFOL N/A 08/23/2016   Procedure: COLONOSCOPY WITH PROPOFOL;  Surgeon: Daneil Dolin, MD;  Location: [facility];  Service: Endoscopy;  Laterality: N/A;  1000  . HERNIA REPAIR Bilateral    right and left inguinal  . INGUINAL HERNIA REPAIR  05/26/2011   Procedure: HERNIA REPAIR INGUINAL ADULT;  Surgeon: Jamesetta So;  Location: [facility];  Service: General;  Laterality: Right;  Recurrent Right Inguinal Hernia Repair with Mesh  . MANDIBLE FRACTURE SURGERY      20 + yrs ago  . NOSE SURGERY     16 yrs ago with fall  . POLYPECTOMY  08/23/2016   Procedure: POLYPECTOMY;  Surgeon: Daneil Dolin, MD;   Location: [facility];  Service: Endoscopy;;  colon  . SHOULDER ARTHROSCOPY     left   . SKULL FRACTURE ELEVATION     16 yrs ago     Current Outpatient Prescriptions  Medication Sig Dispense Refill  . amLODipine (NORVASC) 5 MG tablet Take 5 mg by mouth every evening.     Marland Kitchen aspirin EC 81 MG tablet Take 81 mg by mouth daily.    Marland Kitchen atenolol (TENORMIN) 50 MG tablet Take 50 mg by mouth every evening.     . Cholecalciferol (VITAMIN D3) 1000 units CAPS Take 1,000 Units by mouth daily.     . divalproex (DEPAKOTE) 500 MG EC tablet Take 500 mg by mouth 2 (two) times daily.      . Magnesium 250 MG TABS Take 250 mg by mouth daily.     . Na Sulfate-K Sulfate-Mg Sulf (SUPREP BOWEL PREP KIT) 17.5-3.13-1.6 GM/180ML SOLN Take 1 kit by mouth as directed. 1 Bottle 0  . Omega-3 Krill Oil 1000 MG CAPS Take 1 capsule by mouth 2 (two) times daily.    Marland Kitchen oxyCODONE (OXY IR/ROXICODONE) 5 MG immediate release tablet Take 2.5-5 mg by mouth daily as needed for pain.  No current facility-administered medications for this visit.     Allergies:   Patient has no known allergies.    Social History:  The patient  reports that he has been smoking Cigarettes.  He has a 20.00 pack-year smoking history. He has never used smokeless tobacco. He reports that he drinks about 1.2 oz of alcohol per week . He reports that he does not use drugs.   Family History:  The patient's family history is not on file.    ROS:  Please see the history of present illness.   Otherwise, review of systems are positive for none.   All other systems are reviewed and negative.    PHYSICAL EXAM: VS:  BP (!) 146/90 Comment: pt stressed as he got lost trying to find office-  Pulse 63   Ht 5' 11" (1.803 m)   Wt 71.2 kg (157 lb)   SpO2 98%   BMI 21.90 kg/m  , BMI Body mass index is 21.9 kg/m. Affect appropriate Bronchitic white male  HEENT: normal Neck supple with no adenopathy JVP normal left  bruits no thyromegaly Lungs rhonchi no  wheezing and good diaphragmatic motion Heart:  S1/S2 no murmur, no rub, gallop or click PMI normal Abdomen: benighn, BS positve, no tenderness, no AAA no bruit.  No HSM or HJR Bilateral femoral bruits decreased left femoral and distal pulses compared to right  No edema Neuro non-focal Skin warm and dry No muscular weakness    EKG: 08/19/16 ICRBBB SR no ST changes    Recent Labs: 08/19/2016: BUN 10; Creatinine, Ser 0.79; Hemoglobin 15.0; Platelets 156; Potassium 3.6; Sodium 137    Lipid Panel No results found for: CHOL, TRIG, HDL, CHOLHDL, VLDL, LDLCALC, LDLDIRECT    Wt Readings from Last 3 Encounters:  09/15/16 71.2 kg (157 lb)  08/19/16 71.7 kg (158 lb)  08/12/16 71.5 kg (157 lb 9.6 oz)      Other studies Reviewed: Additional studies/ records that were reviewed today include: Notes Dr Gross labs ECG .    ASSESSMENT AND PLAN:  1. Preop:  Smoker with vascular disease moderate risk surgery f/u lexiscan myovue 2. PVD: likely inflow dx on left bilateral femoral bruits smoker has f/u with Dr Davis will likely need angiogram 3. Carotid bruit on left f/u duplex before colon surgery  4. Rectal prolapse will clear for surgery if myovue non ischemic and no high grade ICA stenosis 5. COPD discussed smoking cessation at some point should have PFTls and f/u with Dr Hawkins   Current medicines are reviewed at length with the patient today.  The patient does not have concerns regarding medicines.  The following changes have been made:  no change  Labs/ tests ordered today include: Carotid Duplex and Lexiscan Myovue  No orders of the defined types were placed in this encounter.    Disposition:   FU with me in 6 months      Signed, Peter Nishan, MD  09/15/2016 9:19 AM    [facility] [address], [place], [place]  [address] Phone: [phone]; Fax: [phone]  

## 2016-09-15 ENCOUNTER — Ambulatory Visit (INDEPENDENT_AMBULATORY_CARE_PROVIDER_SITE_OTHER): Payer: Medicare Other | Admitting: Cardiovascular Disease

## 2016-09-15 ENCOUNTER — Encounter: Payer: Self-pay | Admitting: Cardiovascular Disease

## 2016-09-15 VITALS — BP 146/90 | HR 63 | Ht 71.0 in | Wt 157.0 lb

## 2016-09-15 DIAGNOSIS — R0989 Other specified symptoms and signs involving the circulatory and respiratory systems: Secondary | ICD-10-CM

## 2016-09-15 DIAGNOSIS — Z01818 Encounter for other preprocedural examination: Secondary | ICD-10-CM

## 2016-09-15 NOTE — Patient Instructions (Signed)
Medication Instructions:  Your physician recommends that you continue on your current medications as directed. Please refer to the Current Medication list given to you today.   Labwork: NONE  Testing/Procedures: Your physician has requested that you have a carotid duplex. This test is an ultrasound of the carotid arteries in your neck. It looks at blood flow through these arteries that supply the brain with blood. Allow one hour for this exam. There are no restrictions or special instructions.  Your physician has requested that you have a lexiscan myoview. For further information please visit [email or web address]. Please follow instruction sheet, as given.    Follow-Up: Your physician wants you to follow-up in: 6 MONTHS.  You will receive a reminder letter in the mail two months in advance. If you don't receive a letter, please call our office to schedule the follow-up appointment.   Any Other Special Instructions Will Be Listed Below (If Applicable).     If you need a refill on your cardiac medications before your next appointment, please call your pharmacy.

## 2016-09-21 ENCOUNTER — Ambulatory Visit (HOSPITAL_COMMUNITY)
Admission: RE | Admit: 2016-09-21 | Discharge: 2016-09-21 | Disposition: A | Discharge status: Home / Self Care | Payer: Medicare Other | Source: Ambulatory Visit | Attending: Surgery | Admitting: Surgery

## 2016-09-21 ENCOUNTER — Ambulatory Visit: Payer: Self-pay | Admitting: Surgery

## 2016-09-21 DIAGNOSIS — K623 Rectal prolapse: Secondary | ICD-10-CM | POA: Diagnosis not present

## 2016-09-27 ENCOUNTER — Encounter (HOSPITAL_BASED_OUTPATIENT_CLINIC_OR_DEPARTMENT_OTHER)
Admission: RE | Admit: 2016-09-27 | Discharge: 2016-09-27 | Disposition: A | Discharge status: Home / Self Care | Payer: Medicare Other | Source: Ambulatory Visit | Attending: Cardiovascular Disease | Admitting: Cardiovascular Disease

## 2016-09-27 ENCOUNTER — Inpatient Hospital Stay (HOSPITAL_COMMUNITY): Admission: RE | Admit: 2016-09-27 | Payer: Self-pay | Source: Ambulatory Visit

## 2016-09-27 ENCOUNTER — Encounter (HOSPITAL_COMMUNITY)
Admission: RE | Admit: 2016-09-27 | Discharge: 2016-09-27 | Disposition: A | Discharge status: Home / Self Care | Payer: Medicare Other | Source: Ambulatory Visit | Attending: Cardiovascular Disease | Admitting: Cardiovascular Disease

## 2016-09-27 ENCOUNTER — Encounter (HOSPITAL_COMMUNITY): Payer: Self-pay

## 2016-09-27 ENCOUNTER — Ambulatory Visit (HOSPITAL_COMMUNITY)
Admission: RE | Admit: 2016-09-27 | Discharge: 2016-09-27 | Disposition: A | Discharge status: Home / Self Care | Payer: Medicare Other | Source: Ambulatory Visit | Attending: Cardiovascular Disease | Admitting: Cardiovascular Disease

## 2016-09-27 ENCOUNTER — Telehealth: Payer: Self-pay

## 2016-09-27 DIAGNOSIS — R0989 Other specified symptoms and signs involving the circulatory and respiratory systems: Secondary | ICD-10-CM

## 2016-09-27 DIAGNOSIS — Z01818 Encounter for other preprocedural examination: Secondary | ICD-10-CM | POA: Diagnosis present

## 2016-09-27 DIAGNOSIS — I6523 Occlusion and stenosis of bilateral carotid arteries: Secondary | ICD-10-CM | POA: Insufficient documentation

## 2016-09-27 LAB — NM MYOCAR MULTI W/SPECT W/WALL MOTION / EF
CHL CUP NUCLEAR SSS: 0
LHR: 0.34
LVDIAVOL: 83 mL (ref 62–150)
LVSYSVOL: 28 mL
NUC STRESS TID: 1.13
Peak HR: 76 {beats}/min
Rest HR: 60 {beats}/min
SDS: 0
SRS: 0

## 2016-09-27 MED ORDER — TECHNETIUM TC 99M TETROFOSMIN IV KIT
10.0000 | PACK | Freq: Once | INTRAVENOUS | Status: AC | PRN
  Administered 2016-09-27: 10 via INTRAVENOUS

## 2016-09-27 MED ORDER — TECHNETIUM TC 99M TETROFOSMIN IV KIT
30.0000 | PACK | Freq: Once | INTRAVENOUS | Status: AC | PRN
  Administered 2016-09-27: 30.2 via INTRAVENOUS

## 2016-09-27 MED ORDER — REGADENOSON 0.4 MG/5ML IV SOLN
INTRAVENOUS | Status: AC
  Administered 2016-09-27: 0.4 mg via INTRAVENOUS
  Filled 2016-09-27: qty 5

## 2016-09-27 MED ORDER — SODIUM CHLORIDE 0.9% FLUSH
INTRAVENOUS | Status: AC
  Administered 2016-09-27: 10 mL via INTRAVENOUS
  Filled 2016-09-27: qty 10

## 2016-09-27 NOTE — Telephone Encounter (Signed)
-----   Message from Josue Hector, MD sent at 09/27/2016  1:28 PM EST ----- Less than 50% bilateral ICA disease f/u duplex in 2 years

## 2016-09-27 NOTE — Telephone Encounter (Signed)
Called pt. No answer, left message to call back.

## 2016-09-29 ENCOUNTER — Ambulatory Visit: Payer: Self-pay | Admitting: Surgery

## 2016-09-29 NOTE — H&P (Signed)
[name] [name] 09/08/2016 11:03 AM Location: [facility] Patient #: [id number] DOB: [birth year] Single / Language: [name] [name] / Race: White Male   History of Present Illness [name] Hector MD; 09/08/2016 11:47 AM) The patient is a 65 year old male who presents with rectal prolapse. Note for "Rectal prolapse": Patient sent for surgical consultation aggressive [name] Kaufman, NP & Dr. Jannette [name] with [facility] gastroenterology. Concern for rectal prolapse and incontinence.  Pleasant smoking male. Comes today by himself. Has noticed for the past 10 months worsening prolapse of tissue and leaking out the anus. At the point where it is out all the time now. He'll get incontinence and accidents. Quite frustrating. No severe pain with that but often a lot of discomfort. Blood with wiping. Irritating with bowel movements. He denies any constipation. He claims moves his bowels about twice a day without straining. He does have some left calf pain that keeps him from walking more than 10 minutes at a time. No history of heart attacks or strokes. He's been told quit smoking. He's got down to about a half pack a day but cannot completely quit yet. On colonoscopy, the patient had some hyperplastic and adenomatous polyps removed last month. Found to have obvious rectal prolapse. Surgical consultation requested to see what could be done definitively.   Patient denies any history of any hemorrhoidal problems requiring hemorrhoid surgery are banding or interventions. No personal nor family history of GI/colon cancer, inflammatory bowel disease, irritable bowel syndrome, allergy such as Celiac Sprue, dietary/dairy problems, colitis, ulcers nor gastritis. No recent sick contacts/gastroenteritis. No travel outside the country. No changes in diet. No dysphagia to solids or liquids. No significant heartburn or reflux. No hematochezia, hematemesis, coffee ground emesis. No evidence of prior  gastric/peptic ulceration.    Diagnosis 1. Colon, polyp(s), ascending - TUBULAR ADENOMA. NO HIGH GRADE DYSPLASIA OR MALIGNANCY IDENTIFIED. 2. Rectum, polyp(s) - TUBULAR ADENOMA AND HYPERPLASTIC POLYP. NO HIGH GRADE DYSPLASIA OR MALIGNANCY IDENTIFIED. 3. Rectosigmoid , polyp - HYPERPLASTIC POLYP. NO ADENOMATOUS CHANGE OR MALIGNANCY. [name] Laws MD Pathologist, Electronic Signature (Case signed 08/24/2016) Specimen [name] [name] and Clinical Information Specimen(s) Obtained: 1. Colon, polyp(s), ascending 2. Rectum, polyp(s) 3. Rectosigmoid , polyp Specimen Clinical Information 3. Pre-op: rectal bleeding; Post-op: ascending colon polyp, rectal polyps, rectosigmoid polyp [name] [name] 1. Received in formalin is a tan, soft tissue fragment that is submitted in toto. Size: 0.8 x 0.4 x 0.2 cm, 1 block submitted. 2. The specimen is received in formalin and consists of a 0.5 cm polypoid piece of tan red soft tissue, and a strip of tan mucosa with a sessile polypoid structure measuring 0.7 cm. The specimen is entirely submitted in one cassette. 3. The specimen is received in formalin and consists of a 0.8 x 0.7 x 0.6 cm polypoid piece of tan red soft tissue. The resection margin is inked black, and the specimen is bisected and entirely submitted in one cassette. (KL:ds 08/23/16) 1 of 2 FINAL for [name] [name], [name] [name] [id number]) Report signed out from the following location(s) Technical component and interpretation was performed at [facility] [address], [place], [place] [address]. CLIA #: [id number], 2 of   Other Problems [name] [name], CMA; 09/08/2016 11:03 AM) Back Pain Hemorrhoids Hepatitis High blood pressure Seizure Disorder  Past Surgical History [name] [name], CMA; 09/08/2016 11:03 AM) Colon Polyp Removal - Colonoscopy Oral Surgery  Diagnostic Studies History [name] [name], CMA; 09/08/2016 11:03 AM) Colonoscopy within last year  Allergies [name] Mage  [name], [place]; 09/08/2016 11:04  AM) No Known Drug Allergies11/06/2016  Medication History ([name] [name], CMA; 09/08/2016 11:06 AM) OxyCODONE HCl (5MG  Tablet, Oral) Active. AmLODIPine Besylate (5MG  Tablet, Oral) Active. Atenolol (50MG  Tablet, Oral) Active. Divalproex Sodium (500MG  Tablet DR, Oral) Active. Fluzone High-Dose (0.5ML Susp Pref Syr, Intramuscular) Active. Baby Aspirin (81MG  Tablet Chewable, Oral) Active. Vitamin D3 (1000UNIT Tablet, Oral) Active. Magnesium (250MG  Tablet, Oral) Active. Omega 3 (1000MG  Capsule, Oral) Active. Medications Reconciled  Social History [name] [name], CMA; 09/08/2016 11:03 AM) Alcohol use Occasional alcohol use. Caffeine use Carbonated beverages, Coffee. No drug use Tobacco use Current every day smoker.  Family History [name] [name], [place]; 09/08/2016 11:03 AM) Anesthetic complications Brother. Hypertension Father.    Review of Systems [name] [name] CMA; 09/08/2016 11:03 AM) General Not Present- Appetite Loss, Chills, Fatigue, Fever, Night Sweats, Weight Gain and Weight Loss. Skin Not Present- Change in Wart/Mole, Dryness, Hives, Jaundice, New Lesions, Non-Healing Wounds, Rash and Ulcer. HEENT Present- Hearing Loss and Wears glasses/contact lenses. Not Present- Earache, Hoarseness, Nose Bleed, Oral Ulcers, Ringing in the Ears, Seasonal Allergies, Sinus Pain, Sore Throat, Visual Disturbances and Yellow Eyes. Respiratory Not Present- Bloody sputum, Chronic Cough, Difficulty Breathing, Snoring and Wheezing. Cardiovascular Present- Leg Cramps. Not Present- Chest Pain, Difficulty Breathing Lying Down, Palpitations, Rapid Heart Rate, Shortness of Breath and Swelling of Extremities. Gastrointestinal Present- Hemorrhoids and Rectal Pain. Not Present- Abdominal Pain, Bloating, Bloody Stool, Change in Bowel Habits, Chronic diarrhea, Constipation, Difficulty Swallowing, Excessive gas, Gets full quickly at meals, Indigestion, Nausea and  Vomiting. Male Genitourinary Present- Impotence. Not Present- Blood in Urine, Change in Urinary Stream, Frequency, Nocturia, Painful Urination, Urgency and Urine Leakage. Musculoskeletal Present- Back Pain. Not Present- Joint Pain, Joint Stiffness, Muscle Pain, Muscle Weakness and Swelling of Extremities. Neurological Present- Seizures. Not Present- Decreased Memory, Fainting, Headaches, Numbness, Tingling, Tremor, Trouble walking and Weakness. Psychiatric Not Present- Anxiety, Bipolar, Change in Sleep Pattern, Depression, Fearful and Frequent crying. Endocrine Not Present- Cold Intolerance, Excessive Hunger, Hair Changes, Heat Intolerance, Hot flashes and New Diabetes. Hematology Not Present- Blood Thinners, Easy Bruising, Excessive bleeding, Gland problems, HIV and Persistent Infections.  Vitals ([name] [name] CMA; 09/08/2016 11:04 AM) 09/08/2016 11:04 AM Weight: 156 lb Height: 70in Body Surface Area: 1.88 m Body Mass Index: 22.38 kg/m  Pulse: 76 (Regular)  BP: 138/82 (Sitting, Left Arm, Standard)       Physical Exam [name] Hector MD; 09/08/2016 11:43 AM) General Mental Status-Alert. General Appearance-Not in acute distress, Not Sickly. Orientation-Oriented X3. Hydration-Well hydrated. Voice-Normal. Note: Smell of cigarettes   Integumentary Global Assessment Upon inspection and palpation of skin surfaces of the - Axillae: non-tender, no inflammation or ulceration, no drainage. and Distribution of scalp and body hair is normal. General Characteristics Temperature - normal warmth is noted.  Head and Neck Head-normocephalic, atraumatic with no lesions or palpable masses. Face Global Assessment - atraumatic, no absence of expression. Neck Global Assessment - no abnormal movements, no bruit auscultated on the right, no bruit auscultated on the left, no decreased range of motion, non-tender. Trachea-midline. Thyroid Gland Characteristics -  non-tender.  Eye Eyeball - Left-Extraocular movements intact, No Nystagmus. Eyeball - Right-Extraocular movements intact, No Nystagmus. Cornea - Left-No Hazy. Cornea - Right-No Hazy. Sclera/Conjunctiva - Left-No scleral icterus, No Discharge. Sclera/Conjunctiva - Right-No scleral icterus, No Discharge. Pupil - Left-Direct reaction to light normal. Pupil - Right-Direct reaction to light normal. Note: Wears glasses. Vision acceptable   ENMT Ears Pinna - Left - no drainage observed, no generalized tenderness observed. Right - no drainage observed, no generalized  tenderness observed. Nose and Sinuses External Inspection of the Nose - no destructive lesion observed. Inspection of the nares - Left - quiet respiration. Right - quiet respiration. Mouth and Throat Lips - Upper Lip - no fissures observed, no pallor noted. Lower Lip - no fissures observed, no pallor noted. Nasopharynx - no discharge present. Oral Cavity/Oropharynx - Tongue - no dryness observed. Oral Mucosa - no cyanosis observed. Hypopharynx - no evidence of airway distress observed.  Chest and Lung Exam Inspection Movements - Normal and Symmetrical. Accessory muscles - No use of accessory muscles in breathing. Palpation Palpation of the chest reveals - Non-tender. Auscultation Breath sounds - Normal and Clear.  Cardiovascular Auscultation Rhythm - Regular. Murmurs & Other Heart Sounds - Auscultation of the heart reveals - No Murmurs and No Systolic Clicks.  Abdomen Inspection Inspection of the abdomen reveals - No Visible peristalsis and No Abnormal pulsations. Umbilicus - No Bleeding, No Urine drainage. Palpation/Percussion Palpation and Percussion of the abdomen reveal - Soft, Non Tender, No Rebound tenderness, No Rigidity (guarding) and No Cutaneous hyperesthesia. Note: Abdomen soft. Nontender, nondistended. No guarding. No umbilical no other hernias   Male Genitourinary Sexual Maturity Tanner  5 - Adult hair pattern and Adult penile size and shape. Note: Old bilateral groin incisions. No inguinal hernias. Normal external genitalia. Epididymi, testes, and spermatic cords normal without any masses.   Rectal Note: Obvious complete rectal prolapse 2-3 cm out. Very sensitive. Worse on the right side. Not merely hemorrhoids but circumferential. Eventually reduced.   No discrete rectal masses. Hemorrhoidal piles not particularly enlarged. Grade 2. Actually normal sphincter tone. No fissure. No fistula. Held off on anoscopy given obvious prolapse and sensitivity and recent colonoscopy.   Peripheral Vascular Upper Extremity Inspection - Left - No Cyanotic nailbeds, Not Ischemic. Right - No Cyanotic nailbeds, Not Ischemic.  Neurologic Neurologic evaluation reveals -normal attention span and ability to concentrate, able to name objects and repeat phrases. Appropriate fund of knowledge , normal sensation and normal coordination. Mental Status Affect - not angry, not paranoid. Cranial Nerves-Normal Bilaterally. Gait-Normal.  Neuropsychiatric Mental status exam performed with findings of-able to articulate well with normal speech/language, rate, volume and coherence, thought content normal with ability to perform basic computations and apply abstract reasoning and no evidence of hallucinations, delusions, obsessions or homicidal/suicidal ideation.  Musculoskeletal Global Assessment Spine, Ribs and Pelvis - no instability, subluxation or laxity. Right Upper Extremity - no instability, subluxation or laxity.  Lymphatic Head & Neck  General Head & Neck Lymphatics: Bilateral - Description - No Localized lymphadenopathy. Axillary  General Axillary Region: Bilateral - Description - No Localized lymphadenopathy. Femoral & Inguinal  Generalized Femoral & Inguinal Lymphatics: Left - Description - No Localized lymphadenopathy. Right - Description - No Localized  lymphadenopathy.    Assessment & Plan [name] Hector MD; 09/08/2016 11:43 AM) RECTAL PROLAPSE (K62.3) Impression: Circumferential rectal prolapse with intermittent incontinence. Not hugely long but chronically out and irritating. Not consistent merely with prolapsed hemorrhoids.  Sphincter tone function seems good. Would like to get a sense of his anatomy. See if he has much rectosigmoid redundancy. Would like to get a barium enema to better assess the anatomy.  The most definitive approach is rectosigmoid resection with suture rectopexy. Minimally Invasive robotic or laparoscopic approach. Better correct anatomy and minimize recurrence. However, if he has not much redundancy or is a poor surgical risk, may try a transanal resection first and keep it simple. My concern of the simpler approach is that it  failure rate is high, especially in a smoker.  Cleared by cardiology.  QUIT SMOKING! Current Plans Pt Education - CCS Rectal Prolapse Info ([name] [name]): discussed with patient and provided information. Pt Education - CCS Pelvic Floor Exercises (Kegels) and Dysfunction HCI ([name] [name]) Given his smoking history and fair walking tolerance, would like a cardiac clearance to get a sense of how good his cardiac function is. If it looks like he has good ejection fraction is felt to be mild cardiac risk, may proceed with more aggressive surgical repair for better long-term result. If very concerning, may hold off into more simpler repair, knowing that the failure rate is higher.  I recommended obtaining preoperative cardiac clearance. I am concerned about the health of the patient and the ability to tolerate the operation. Therefore, we will request clearance by cardiology to better assess operative risk & see if a reevaluation, further workup, etc is needed. Also recommendations on how medications such as for anticoagulation and blood pressure should be managed/held/restarted after surgery.  You are being  scheduled for surgery - Our schedulers will call you.  You should hear from our office's scheduling department within 5 working days about the location, date, and time of surgery. We try to make accommodations for patient's preferences in scheduling surgery, but sometimes the OR schedule or the surgeon's schedule prevents [place] from making those accommodations.  If you have not heard from our office [phone]) in 5 working days, call the office and ask for your surgeon's nurse.  If you have other questions about your diagnosis, plan, or surgery, call the office and ask for your surgeon's nurse.  TOBACCO ABUSE (Z72.0) Impression: I strongly recommend he quit smoking.  Try and walk more regularly to decrease claudication and decrease operative risks. Current Plans Pt Education - CCS STOP SMOKING! COLORECTAL POLYPS (K63.5) Current Plans Pt Education - Education: Pathology Report given to patient  [name] [name], M.D., F.A.C.S. Gastrointestinal and Minimally Invasive Surgery [facility] [address], [place], [place] [id number] [phone] Main / Paging

## 2016-10-14 ENCOUNTER — Inpatient Hospital Stay: Admission: RE | Admit: 2016-10-14 | Payer: Self-pay | Source: Ambulatory Visit

## 2016-10-14 ENCOUNTER — Encounter: Payer: Self-pay | Admitting: Acute Care

## 2016-11-15 ENCOUNTER — Ambulatory Visit: Payer: Medicare Other | Admitting: Gastroenterology

## 2016-12-02 ENCOUNTER — Telehealth: Payer: Self-pay | Admitting: Acute Care

## 2016-12-03 NOTE — Telephone Encounter (Signed)
Will forward to the lung screening pool 

## 2016-12-07 DIAGNOSIS — H524 Presbyopia: Secondary | ICD-10-CM | POA: Diagnosis not present

## 2016-12-07 DIAGNOSIS — H52223 Regular astigmatism, bilateral: Secondary | ICD-10-CM | POA: Diagnosis not present

## 2016-12-07 DIAGNOSIS — H5203 Hypermetropia, bilateral: Secondary | ICD-10-CM | POA: Diagnosis not present

## 2016-12-07 DIAGNOSIS — H2513 Age-related nuclear cataract, bilateral: Secondary | ICD-10-CM | POA: Diagnosis not present

## 2016-12-08 NOTE — Telephone Encounter (Signed)
Spoke with pt. His colon surgery is postponed to 01/26/17 and he would like to wait until after surgery to schedule SDMV.  Pt to call us back at that time. Nothing further needed at this time.

## 2017-01-03 ENCOUNTER — Ambulatory Visit: Payer: Self-pay | Admitting: Surgery

## 2017-01-03 NOTE — H&P (Signed)
[name] [name] 09/08/2016 11:03 AM Location: [facility] Patient #: [id number] DOB: 07/20/1951 Single / Language: English / Race: White Male   History of Present Illness  The patient is a 65 year old male who presents with rectal prolapse. Note for "Rectal prolapse": Patient sent for surgical consultation aggressive [name] Boone, NP & Dr. R Rourk with [facility]. Concern for rectal prolapse and incontinence.  Pleasant smoking male. Comes today by himself. Has noticed for the past 10 months worsening prolapse of tissue and leaking out the anus. At the point where it is out all the time now. He'll get incontinence and accidents. Quite frustrating. No severe pain with that but often a lot of discomfort. Blood with wiping. Irritating with bowel movements. He denies any constipation. He claims moves his bowels about twice a day without straining. He does have some left calf pain that keeps him from walking more than 10 minutes at a time. No history of heart attacks or strokes. He's been told quit smoking. He's got down to about a half pack a day but cannot completely quit yet. On colonoscopy, the patient had some hyperplastic and adenomatous polyps removed last month. Found to have obvious rectal prolapse. Surgical consultation requested to see what could be done definitively.   Patient denies any history of any hemorrhoidal problems requiring hemorrhoid surgery are banding or interventions. No personal nor family history of GI/colon cancer, inflammatory bowel disease, irritable bowel syndrome, allergy such as Celiac Sprue, dietary/dairy problems, colitis, ulcers nor gastritis. No recent sick contacts/gastroenteritis. No travel outside the country. No changes in diet. No dysphagia to solids or liquids. No significant heartburn or reflux. No hematochezia, hematemesis, coffee ground emesis. No evidence of prior gastric/peptic ulceration.  Patient cleared  for surgery.  Related to consider surgical resection.  Diagnosis 1. Colon, polyp(s), ascending - TUBULAR ADENOMA. NO HIGH GRADE DYSPLASIA OR MALIGNANCY IDENTIFIED. 2. Rectum, polyp(s) - TUBULAR ADENOMA AND HYPERPLASTIC POLYP. NO HIGH GRADE DYSPLASIA OR MALIGNANCY IDENTIFIED. 3. Rectosigmoid , polyp - HYPERPLASTIC POLYP. NO ADENOMATOUS CHANGE OR MALIGNANCY. [name] PATRICK MD Pathologist, Electronic Signature (Case signed 08/24/2016) Specimen [name] [name] and Clinical Information Specimen(s) Obtained: 1. Colon, polyp(s), ascending 2. Rectum, polyp(s) 3. Rectosigmoid , polyp Specimen Clinical Information 3. Pre-op: rectal bleeding; Post-op: ascending colon polyp, rectal polyps, rectosigmoid polyp [name] [name] 1. Received in formalin is a tan, soft tissue fragment that is submitted in toto. Size: 0.8 x 0.4 x 0.2 cm, 1 block submitted. 2. The specimen is received in formalin and consists of a 0.5 cm polypoid piece of tan red soft tissue, and a strip of tan mucosa with a sessile polypoid structure measuring 0.7 cm. The specimen is entirely submitted in one cassette. 3. The specimen is received in formalin and consists of a 0.8 x 0.7 x 0.6 cm polypoid piece of tan red soft tissue. The resection margin is inked black, and the specimen is bisected and entirely submitted in one cassette. (KL:ds 08/23/16) 1 of 2 FINAL for [name] [name] ([id number]) Report signed out from the following location(s) Technical component and interpretation was performed at [facility] [address], [place], [place] [address]. CLIA #: [id number], 2 of   Other Problems ([name] [name], CMA; 09/08/2016 11:03 AM) Back Pain  Hemorrhoids  Hepatitis  High blood pressure  Seizure Disorder   Past Surgical History ([name] [name], CMA; 09/08/2016 11:03 AM) Colon Polyp Removal - Colonoscopy  Oral Surgery   Diagnostic Studies History ([name] [name], CMA; 09/08/2016 11:03 AM) Colonoscopy    within  last year  Allergies ([name] [name], CMA; 09/08/2016 11:04 AM) No Known Drug Allergies 09/08/2016  Medication History ([name] [name], CMA; 09/08/2016 11:06 AM) OxyCODONE HCl (5MG Tablet, Oral) Active. AmLODIPine Besylate (5MG Tablet, Oral) Active. Atenolol (50MG Tablet, Oral) Active. Divalproex Sodium (500MG Tablet DR, Oral) Active. Fluzone High-Dose (0.5ML Susp Pref Syr, Intramuscular) Active. Baby Aspirin (81MG Tablet Chewable, Oral) Active. Vitamin D3 (1000UNIT Tablet, Oral) Active. Magnesium (250MG Tablet, Oral) Active. Omega 3 (1000MG Capsule, Oral) Active. Medications Reconciled  Social History ([name] [name], CMA; 09/08/2016 11:03 AM) Alcohol use  Occasional alcohol use. Caffeine use  Carbonated beverages, Coffee. No drug use  Tobacco use  Current every day smoker.  Family History ([name] [name], CMA; 09/08/2016 11:03 AM) Anesthetic complications  Brother. Hypertension  Father.    Review of Systems ([name] [name] CMA; 09/08/2016 11:03 AM) General Not Present- Appetite Loss, Chills, Fatigue, Fever, Night Sweats, Weight Gain and Weight Loss. Skin Not Present- Change in Wart/Mole, Dryness, Hives, Jaundice, New Lesions, Non-Healing Wounds, Rash and Ulcer. HEENT Present- Hearing Loss and Wears glasses/contact lenses. Not Present- Earache, Hoarseness, Nose Bleed, Oral Ulcers, Ringing in the Ears, Seasonal Allergies, Sinus Pain, Sore Throat, Visual Disturbances and Yellow Eyes. Respiratory Not Present- Bloody sputum, Chronic Cough, Difficulty Breathing, Snoring and Wheezing. Cardiovascular Present- Leg Cramps. Not Present- Chest Pain, Difficulty Breathing Lying Down, Palpitations, Rapid Heart Rate, Shortness of Breath and Swelling of Extremities. Gastrointestinal Present- Hemorrhoids and Rectal Pain. Not Present- Abdominal Pain, Bloating, Bloody Stool, Change in Bowel Habits, Chronic diarrhea, Constipation, Difficulty Swallowing, Excessive gas, Gets full  quickly at meals, Indigestion, Nausea and Vomiting. Male Genitourinary Present- Impotence. Not Present- Blood in Urine, Change in Urinary Stream, Frequency, Nocturia, Painful Urination, Urgency and Urine Leakage. Musculoskeletal Present- Back Pain. Not Present- Joint Pain, Joint Stiffness, Muscle Pain, Muscle Weakness and Swelling of Extremities. Neurological Present- Seizures. Not Present- Decreased Memory, Fainting, Headaches, Numbness, Tingling, Tremor, Trouble walking and Weakness. Psychiatric Not Present- Anxiety, Bipolar, Change in Sleep Pattern, Depression, Fearful and Frequent crying. Endocrine Not Present- Cold Intolerance, Excessive Hunger, Hair Changes, Heat Intolerance, Hot flashes and New Diabetes. Hematology Not Present- Blood Thinners, Easy Bruising, Excessive bleeding, Gland problems, HIV and Persistent Infections.  Vitals ([name] [name] CMA; 09/08/2016 11:04 AM) 09/08/2016 11:04 AM Weight: 156 lb Height: 70in Body Surface Area: 1.88 m Body Mass Index: 22.38 kg/m  Pulse: 76 (Regular)  BP: 138/82 (Sitting, Left Arm, Standard)       Physical Exam (Lafe Clerk C. Linsey Arteaga MD; 09/08/2016 11:43 AM) General Mental Status-Alert. General Appearance-Not in acute distress, Not Sickly. Orientation-Oriented X3. Hydration-Well hydrated. Voice-Normal. Note: Smell of cigarettes   Integumentary Global Assessment Upon inspection and palpation of skin surfaces of the - Axillae: non-tender, no inflammation or ulceration, no drainage. and Distribution of scalp and body hair is normal. General Characteristics Temperature - normal warmth is noted.  Head and Neck Head-normocephalic, atraumatic with no lesions or palpable masses. Face Global Assessment - atraumatic, no absence of expression. Neck Global Assessment - no abnormal movements, no bruit auscultated on the right, no bruit auscultated on the left, no decreased range of motion,  non-tender. Trachea-midline. Thyroid Gland Characteristics - non-tender.  Eye Eyeball - Left-Extraocular movements intact, No Nystagmus. Eyeball - Right-Extraocular movements intact, No Nystagmus. Cornea - Left-No Hazy. Cornea - Right-No Hazy. Sclera/Conjunctiva - Left-No scleral icterus, No Discharge. Sclera/Conjunctiva - Right-No scleral icterus, No Discharge. Pupil - Left-Direct reaction to light normal. Pupil - Right-Direct reaction to light normal. Note: Wears glasses. Vision acceptable   ENMT Ears   Pinna - Left - no drainage observed, no generalized tenderness observed. Right - no drainage observed, no generalized tenderness observed. Nose and Sinuses External Inspection of the Nose - no destructive lesion observed. Inspection of the nares - Left - quiet respiration. Right - quiet respiration. Mouth and Throat Lips - Upper Lip - no fissures observed, no pallor noted. Lower Lip - no fissures observed, no pallor noted. Nasopharynx - no discharge present. Oral Cavity/Oropharynx - Tongue - no dryness observed. Oral Mucosa - no cyanosis observed. Hypopharynx - no evidence of airway distress observed.  Chest and Lung Exam Inspection Movements - Normal and Symmetrical. Accessory muscles - No use of accessory muscles in breathing. Palpation Palpation of the chest reveals - Non-tender. Auscultation Breath sounds - Normal and Clear.  Cardiovascular Auscultation Rhythm - Regular. Murmurs & Other Heart Sounds - Auscultation of the heart reveals - No Murmurs and No Systolic Clicks.  Abdomen Inspection Inspection of the abdomen reveals - No Visible peristalsis and No Abnormal pulsations. Umbilicus - No Bleeding, No Urine drainage. Palpation/Percussion Palpation and Percussion of the abdomen reveal - Soft, Non Tender, No Rebound tenderness, No Rigidity (guarding) and No Cutaneous hyperesthesia. Note: Abdomen soft. Nontender, nondistended. No guarding. No umbilical  no other hernias   Male Genitourinary Sexual Maturity Tanner 5 - Adult hair pattern and Adult penile size and shape. Note: Old bilateral groin incisions. No inguinal hernias. Normal external genitalia. Epididymi, testes, and spermatic cords normal without any masses.   Rectal Note: Obvious complete rectal prolapse 2-3 cm out. Very sensitive. Worse on the right side. Not merely hemorrhoids but circumferential. Eventually reduced.   No discrete rectal masses. Hemorrhoidal piles not particularly enlarged. Grade 2. Actually normal sphincter tone. No fissure. No fistula. Held off on anoscopy given obvious prolapse and sensitivity and recent colonoscopy.   Peripheral Vascular Upper Extremity Inspection - Left - No Cyanotic nailbeds, Not Ischemic. Right - No Cyanotic nailbeds, Not Ischemic.  Neurologic Neurologic evaluation reveals -normal attention span and ability to concentrate, able to name objects and repeat phrases. Appropriate fund of knowledge , normal sensation and normal coordination. Mental Status Affect - not angry, not paranoid. Cranial Nerves-Normal Bilaterally. Gait-Normal.  Neuropsychiatric Mental status exam performed with findings of-able to articulate well with normal speech/language, rate, volume and coherence, thought content normal with ability to perform basic computations and apply abstract reasoning and no evidence of hallucinations, delusions, obsessions or homicidal/suicidal ideation.  Musculoskeletal Global Assessment Spine, Ribs and Pelvis - no instability, subluxation or laxity. Right Upper Extremity - no instability, subluxation or laxity.  Lymphatic Head & Neck  General Head & Neck Lymphatics: Bilateral - Description - No Localized lymphadenopathy. Axillary  General Axillary Region: Bilateral - Description - No Localized lymphadenopathy. Femoral & Inguinal  Generalized Femoral & Inguinal Lymphatics: Left - Description - No  Localized lymphadenopathy. Right - Description - No Localized lymphadenopathy.    Assessment & Plan  RECTAL PROLAPSE (K62.3) Impression: Circumferential rectal prolapse with intermittent incontinence. Not hugely long but chronically out and irritating. Not consistent merely with prolapsed hemorrhoids.  Sphincter tone function seems good. Would like to get a sense of his anatomy. See if he has much rectosigmoid redundancy. Would like to get a barium enema to better assess the anatomy.  The most definitive approach is rectosigmoid resection with suture rectopexy. Minimally Invasive robotic or laparoscopic approach. Better correct anatomy and minimize recurrence. However, if he has not much redundancy or is a poor surgical risk, may try a transanal resection first and   keep it simple. My concern of the simpler approach is that it failure rate is high, especially in a smoker.  We'll try and do more standard resection/rectopexy for better long-term result  QUIT SMOKING! Current Plans Pt Education - [facility] Rectal Prolapse Info (Aliviya Schoeller): discussed with patient and provided information. Pt Education - [facility] Pelvic Floor Exercises (Kegels) and Dysfunction HCI (Yamen Castrogiovanni) Given his smoking history and fair walking tolerance, would like a cardiac clearance to get a sense of how good his cardiac function is. If it looks like he has good ejection fraction is felt to be mild cardiac risk, may proceed with more aggressive surgical repair for better long-term result. If very concerning, may hold off into more simpler repair, knowing that the failure rate is higher.  I recommended obtaining preoperative cardiac clearance. I am concerned about the health of the patient and the ability to tolerate the operation. Therefore, we will request clearance by cardiology to better assess operative risk & see if a reevaluation, further workup, etc is needed. Also recommendations on how medications such as for anticoagulation and blood  pressure should be managed/held/restarted after surgery.  You are being scheduled for surgery - Our schedulers will call you.  You should hear from our office's scheduling department within 5 working days about the location, date, and time of surgery. We try to make accommodations for patient's preferences in scheduling surgery, but sometimes the OR schedule or the surgeon's schedule prevents us from making those accommodations.  If you have not heard from our office ([phone]) in 5 working days, call the office and ask for your surgeon's nurse.  If you have other questions about your diagnosis, plan, or surgery, call the office and ask for your surgeon's nurse.  TOBACCO ABUSE (Z72.0) Impression: I strongly recommend he quit smoking.  Try and walk more regularly to decrease claudication and decrease operative risks. Current Plans Pt Education - CCS STOP SMOKING! COLORECTAL POLYPS (K63.5) Current Plans Pt Education - Education: Pathology Report given to patient  Izabell Schalk C. Shamell Hittle, M.D., F.A.C.S. Gastrointestinal and Minimally Invasive Surgery [facility] [address], [address] [place], [place] [address] [phone] Main / Paging   

## 2017-01-21 NOTE — Progress Notes (Signed)
LOV cardiac Dr Johnsie Cancel and clearance with normal Stress test 09-15-16 epic  Stress Test 09-27-16 epic  EKG 08-19-16 epic

## 2017-01-21 NOTE — Patient Instructions (Addendum)
[name] [name]  01/21/2017   Your procedure is scheduled on: 01-26-17       Report to [facility]  Entrance take [facility]  elevators to 3rd floor to  [place] at 630AM.  Call this number if you have problems the morning of surgery [phone]   Remember: ONLY 1 PERSON MAY GO WITH YOU TO SHORT STAY TO GET  READY MORNING OF [place].  Do not eat food After Midnight 01-24-17. Drink plenty of clear liquids all day Tuesday 01-25-17 and follow all colon prep orders per Dr Johney [place]. Nothing by mouth after midnight Tuesday!     Take these medicines the morning of surgery with A SIP OF WATER: divalproex(Depakote)                                 You may not have any metal on your body including hair pins and              piercings  Do not wear jewelry, make-up, lotions, powders or perfumes, deodorant             Do not wear nail polish.  Do not shave  48 hours prior to surgery.              Men may shave face and neck.   Do not bring valuables to the hospital. [place].  Contacts, dentures or bridgework may not be worn into surgery.  Leave suitcase in the car. After surgery it may be brought to your room.              Please read over the following fact sheets you were given: _____________________________________________________________________              COLON BOWEL PREP Please follow the instructions carefully. It is important to clean out your bowels & take the prescribed antibiotic pills to lower your chances of a wound infection or abscess.   FIVE DAYS PRIOR TO YOUR SURGERY Stop eating any nuts, popcorn, or fruit with seeds. Stop all fiber supplements such as Metamucil, Citrucel, etc.   Hold taking any blood thinning anticoagulation medication (ex: aspirin, warfarin/Coumadin, Plavix, Xarelto, Eliquis, Pradaxa, etc) as recommended by your medical/cardiology doctor  Obtain what you need at a  pharmacy of your choice: -Filled out prescriptions for your oral antibiotics (Neomycin & Metronidazole)  -A bottle of MiraLax / Glycolax (288g) - no prescription required  -A large bottle of Gatorade / Powerade (64oz)  -Dulcolax tablets (4 tabs) - no prescription required   DAY PRIOR TO SURGERY   7:00am Swallow 4 Dulcolax tablets with some water Drink plenty of clear liquids all day to avoid getting dehydrated (Water, juice, soda, coffee, tea, bouillon, jello, etc.)  10:00am Mix the bottle of MiraLax with the 64-oz bottle of Gatorade.  Drink the Gatorade mixture gradually over the next few hours (8oz glass every 15-30 minutes) until gone. You should finish by 2pm.  2:00pm Take 2 Neomycin 500mg  tablets & 2 Metronidazole 500mg  tablets  3:00pm Take 2 Neomycin 500mg  tablets & 2 Metronidazole 500mg  tablets  Drink plenty of clear liquids all evening to avoid getting dehydrated  10:00pm Take 2 Neomycin 500mg  tablets & 2 Metronidazole 500mg  tablets  Do not eat or drink anything after bedtime (midnight) the night before your surgery.   MORNING OF SURGERY Remember to not to drink or eat anything that morning  Hold or take medications as recommended by the hospital staff at your Preoperative visit  If you have questions or concerns, please call [place] [phone] during business hours to speak to the clinical staff for advice.      CLEAR LIQUID DIET   Foods Allowed                                                                     Foods Excluded  Coffee and tea, regular and decaf                             liquids that you cannot  Plain Jell-O in any flavor                                             see through such as: Fruit ices (not with fruit pulp)                                     milk, soups, orange juice  Iced Popsicles                                    All solid food Carbonated beverages, regular and diet                                     Cranberry, grape and apple juices Sports drinks like Gatorade Lightly seasoned clear broth or consume(fat free) Sugar, honey syrup  Sample Menu Breakfast                                Lunch                                     Supper Cranberry juice                    Beef broth                            Chicken broth Jell-O                                     Grape juice                           Apple juice Coffee or tea  Jell-O                                      Popsicle                                                Coffee or tea                        Coffee or tea  _____________________________________________________________________  [facility] - Preparing for Surgery Before surgery, you can play an important role.  Because skin is not sterile, your skin needs to be as free of germs as possible.  You can reduce the number of germs on your skin by washing with CHG (chlorahexidine gluconate) soap before surgery.  CHG is an antiseptic cleaner which kills germs and bonds with the skin to continue killing germs even after washing. Please DO NOT use if you have an allergy to CHG or antibacterial soaps.  If your skin becomes reddened/irritated stop using the CHG and inform your nurse when you arrive at Short Stay. Do not shave (including legs and underarms) for at least 48 hours prior to the first CHG shower.  You may shave your face/neck. Please follow these instructions carefully:  1.  Shower with CHG Soap the night before surgery and the  morning of Surgery.  2.  If you choose to wash your hair, wash your hair first as usual with your  normal  shampoo.  3.  After you shampoo, rinse your hair and body thoroughly to remove the  shampoo.                           4.  Use CHG as you would any other liquid soap.  You can apply chg directly  to the skin and wash                       Gently with a scrungie or clean washcloth.  5.  Apply the CHG Soap to your body ONLY  FROM THE NECK DOWN.   Do not use on face/ open                           Wound or open sores. Avoid contact with eyes, ears mouth and genitals (private parts).                       Wash face,  Genitals (private parts) with your normal soap.             6.  Wash thoroughly, paying special attention to the area where your surgery  will be performed.  7.  Thoroughly rinse your body with warm water from the neck down.  8.  DO NOT shower/wash with your normal soap after using and rinsing off  the CHG Soap.                9.  Pat yourself dry with a clean towel.            10.  Wear clean pajamas.            11.  Place clean  sheets on your bed the night of your first shower and do not  sleep with pets. Day of Surgery : Do not apply any lotions/deodorants the morning of surgery.  Please wear clean clothes to the hospital/surgery center.  FAILURE TO FOLLOW THESE INSTRUCTIONS MAY RESULT IN THE CANCELLATION OF YOUR SURGERY PATIENT SIGNATURE_________________________________  NURSE SIGNATURE__________________________________  ________________________________________________________________________  WHAT IS A BLOOD TRANSFUSION? Blood Transfusion Information  A transfusion is the replacement of blood or some of its parts. Blood is made up of multiple cells which provide different functions.  Red blood cells carry oxygen and are used for blood loss replacement.  White blood cells fight against infection.  Platelets control bleeding.  Plasma helps clot blood.  Other blood products are available for specialized needs, such as hemophilia or other clotting disorders. BEFORE THE TRANSFUSION  Who gives blood for transfusions?   Healthy volunteers who are fully evaluated to make sure their blood is safe. This is blood bank blood. Transfusion therapy is the safest it has ever been in the practice of medicine. Before blood is taken from a donor, a complete history is taken to make sure that person has  no history of diseases nor engages in risky social behavior (examples are intravenous drug use or sexual activity with multiple partners). The donor's travel history is screened to minimize risk of transmitting infections, such as malaria. The donated blood is tested for signs of infectious diseases, such as HIV and hepatitis. The blood is then tested to be sure it is compatible with you in order to minimize the chance of a transfusion reaction. If you or a relative donates blood, this is often done in anticipation of surgery and is not appropriate for emergency situations. It takes many days to process the donated blood. RISKS AND COMPLICATIONS Although transfusion therapy is very safe and saves many lives, the main dangers of transfusion include:   Getting an infectious disease.  Developing a transfusion reaction. This is an allergic reaction to something in the blood you were given. Every precaution is taken to prevent this. The decision to have a blood transfusion has been considered carefully by your caregiver before blood is given. Blood is not given unless the benefits outweigh the risks. AFTER THE TRANSFUSION  Right after receiving a blood transfusion, you will usually feel much better and more energetic. This is especially true if your red blood cells have gotten low (anemic). The transfusion raises the level of the red blood cells which carry oxygen, and this usually causes an energy increase.  The nurse administering the transfusion will monitor you carefully for complications. HOME CARE INSTRUCTIONS  No special instructions are needed after a transfusion. You may find your energy is better. Speak with your caregiver about any limitations on activity for underlying diseases you may have. SEEK MEDICAL CARE IF:   Your condition is not improving after your transfusion.  You develop redness or irritation at the intravenous (IV) site. SEEK IMMEDIATE MEDICAL CARE IF:  Any of the following  symptoms occur over the next 12 hours:  Shaking chills.  You have a temperature by mouth above 102 F (38.9 C), not controlled by medicine.  Chest, back, or muscle pain.  People around you feel you are not acting correctly or are confused.  Shortness of breath or difficulty breathing.  Dizziness and fainting.  You get a rash or develop hives.  You have a decrease in urine output.  Your urine turns a dark color or changes to pink,  red, or brown. Any of the following symptoms occur over the next 10 days:  You have a temperature by mouth above 102 F (38.9 C), not controlled by medicine.  Shortness of breath.  Weakness after normal activity.  The white part of the eye turns yellow (jaundice).  You have a decrease in the amount of urine or are urinating less often.  Your urine turns a dark color or changes to pink, red, or brown. Document Released: 10/15/2000 Document Revised: 01/10/2012 Document Reviewed: 06/03/2008 [facility] Patient Information 2014 [place], [place].  _______________________________________________________________________

## 2017-01-24 ENCOUNTER — Encounter (HOSPITAL_COMMUNITY): Payer: Self-pay

## 2017-01-24 ENCOUNTER — Encounter (HOSPITAL_COMMUNITY)
Admission: RE | Admit: 2017-01-24 | Discharge: 2017-01-24 | Disposition: A | Discharge status: Home / Self Care | Payer: Medicare Other | Source: Ambulatory Visit | Attending: Surgery | Admitting: Surgery

## 2017-01-24 DIAGNOSIS — Z01812 Encounter for preprocedural laboratory examination: Secondary | ICD-10-CM | POA: Insufficient documentation

## 2017-01-24 DIAGNOSIS — I10 Essential (primary) hypertension: Secondary | ICD-10-CM | POA: Diagnosis not present

## 2017-01-24 DIAGNOSIS — K623 Rectal prolapse: Secondary | ICD-10-CM

## 2017-01-24 DIAGNOSIS — J189 Pneumonia, unspecified organism: Secondary | ICD-10-CM | POA: Diagnosis not present

## 2017-01-24 DIAGNOSIS — J9811 Atelectasis: Secondary | ICD-10-CM | POA: Diagnosis not present

## 2017-01-24 DIAGNOSIS — J440 Chronic obstructive pulmonary disease with acute lower respiratory infection: Secondary | ICD-10-CM | POA: Diagnosis not present

## 2017-01-24 DIAGNOSIS — K648 Other hemorrhoids: Secondary | ICD-10-CM | POA: Diagnosis not present

## 2017-01-24 DIAGNOSIS — J9801 Acute bronchospasm: Secondary | ICD-10-CM | POA: Diagnosis not present

## 2017-01-24 DIAGNOSIS — R0902 Hypoxemia: Secondary | ICD-10-CM | POA: Diagnosis not present

## 2017-01-24 DIAGNOSIS — J441 Chronic obstructive pulmonary disease with (acute) exacerbation: Secondary | ICD-10-CM | POA: Diagnosis not present

## 2017-01-24 DIAGNOSIS — T17990A Other foreign object in respiratory tract, part unspecified in causing asphyxiation, initial encounter: Secondary | ICD-10-CM | POA: Diagnosis not present

## 2017-01-24 LAB — CBC
HEMATOCRIT: 44.1 % (ref 39.0–52.0)
HEMOGLOBIN: 15.5 g/dL (ref 13.0–17.0)
MCH: 32.7 pg (ref 26.0–34.0)
MCHC: 35.1 g/dL (ref 30.0–36.0)
MCV: 93 fL (ref 78.0–100.0)
Platelets: 173 10*3/uL (ref 150–400)
RBC: 4.74 MIL/uL (ref 4.22–5.81)
RDW: 13.2 % (ref 11.5–15.5)
WBC: 5.7 10*3/uL (ref 4.0–10.5)

## 2017-01-24 LAB — BASIC METABOLIC PANEL
ANION GAP: 5 (ref 5–15)
BUN: 12 mg/dL (ref 6–20)
CHLORIDE: 103 mmol/L (ref 101–111)
CO2: 29 mmol/L (ref 22–32)
CREATININE: 0.88 mg/dL (ref 0.61–1.24)
Calcium: 9.7 mg/dL (ref 8.9–10.3)
GFR calc non Af Amer: >60 mL/min (ref >60)
Glucose, Bld: 105 mg/dL — ABNORMAL HIGH (ref 65–99)
POTASSIUM: 5 mmol/L (ref 3.5–5.1)
SODIUM: 137 mmol/L (ref 135–145)

## 2017-01-24 LAB — ABO/RH: ABO/RH(D): AB POS

## 2017-01-25 LAB — HEMOGLOBIN A1C
HEMOGLOBIN A1C: 5.7 % — AB (ref 4.8–5.6)
Mean Plasma Glucose: 117 mg/dL

## 2017-01-25 MED ORDER — GENTAMICIN SULFATE 40 MG/ML IJ SOLN
INTRAMUSCULAR | Status: DC
  Filled 2017-01-25: qty 6

## 2017-01-26 ENCOUNTER — Inpatient Hospital Stay (HOSPITAL_COMMUNITY): Payer: Medicare Other | Admitting: Anesthesiology

## 2017-01-26 ENCOUNTER — Inpatient Hospital Stay (HOSPITAL_COMMUNITY)
Admission: RE | Admit: 2017-01-26 | Discharge: 2017-01-31 | DRG: 329 | Disposition: A | Discharge status: Home / Self Care | Payer: Medicare Other | Source: Ambulatory Visit | Attending: Surgery | Admitting: Surgery

## 2017-01-26 ENCOUNTER — Encounter (HOSPITAL_COMMUNITY)
Admission: RE | Disposition: A | Discharge status: Home / Self Care | Payer: Self-pay | Source: Ambulatory Visit | Attending: Surgery

## 2017-01-26 ENCOUNTER — Encounter (HOSPITAL_COMMUNITY): Payer: Self-pay | Admitting: Anesthesiology

## 2017-01-26 DIAGNOSIS — J90 Pleural effusion, not elsewhere classified: Secondary | ICD-10-CM | POA: Diagnosis not present

## 2017-01-26 DIAGNOSIS — J189 Pneumonia, unspecified organism: Secondary | ICD-10-CM | POA: Diagnosis not present

## 2017-01-26 DIAGNOSIS — R0902 Hypoxemia: Secondary | ICD-10-CM | POA: Diagnosis not present

## 2017-01-26 DIAGNOSIS — J432 Centrilobular emphysema: Secondary | ICD-10-CM | POA: Diagnosis not present

## 2017-01-26 DIAGNOSIS — F17210 Nicotine dependence, cigarettes, uncomplicated: Secondary | ICD-10-CM | POA: Diagnosis present

## 2017-01-26 DIAGNOSIS — K648 Other hemorrhoids: Secondary | ICD-10-CM | POA: Diagnosis present

## 2017-01-26 DIAGNOSIS — J9801 Acute bronchospasm: Secondary | ICD-10-CM | POA: Diagnosis not present

## 2017-01-26 DIAGNOSIS — D127 Benign neoplasm of rectosigmoid junction: Secondary | ICD-10-CM | POA: Diagnosis not present

## 2017-01-26 DIAGNOSIS — K623 Rectal prolapse: Secondary | ICD-10-CM | POA: Diagnosis not present

## 2017-01-26 DIAGNOSIS — J9811 Atelectasis: Secondary | ICD-10-CM | POA: Diagnosis not present

## 2017-01-26 DIAGNOSIS — T17990A Other foreign object in respiratory tract, part unspecified in causing asphyxiation, initial encounter: Secondary | ICD-10-CM | POA: Diagnosis not present

## 2017-01-26 DIAGNOSIS — I10 Essential (primary) hypertension: Secondary | ICD-10-CM | POA: Diagnosis present

## 2017-01-26 DIAGNOSIS — R32 Unspecified urinary incontinence: Secondary | ICD-10-CM | POA: Diagnosis present

## 2017-01-26 DIAGNOSIS — B1920 Unspecified viral hepatitis C without hepatic coma: Secondary | ICD-10-CM | POA: Diagnosis present

## 2017-01-26 DIAGNOSIS — J449 Chronic obstructive pulmonary disease, unspecified: Secondary | ICD-10-CM | POA: Diagnosis not present

## 2017-01-26 DIAGNOSIS — R159 Full incontinence of feces: Secondary | ICD-10-CM | POA: Diagnosis present

## 2017-01-26 DIAGNOSIS — K643 Fourth degree hemorrhoids: Secondary | ICD-10-CM | POA: Diagnosis present

## 2017-01-26 DIAGNOSIS — J441 Chronic obstructive pulmonary disease with (acute) exacerbation: Secondary | ICD-10-CM | POA: Diagnosis not present

## 2017-01-26 DIAGNOSIS — K649 Unspecified hemorrhoids: Secondary | ICD-10-CM | POA: Diagnosis not present

## 2017-01-26 DIAGNOSIS — G40909 Epilepsy, unspecified, not intractable, without status epilepticus: Secondary | ICD-10-CM | POA: Diagnosis present

## 2017-01-26 DIAGNOSIS — I700 Atherosclerosis of aorta: Secondary | ICD-10-CM | POA: Diagnosis not present

## 2017-01-26 DIAGNOSIS — R569 Unspecified convulsions: Secondary | ICD-10-CM

## 2017-01-26 DIAGNOSIS — R918 Other nonspecific abnormal finding of lung field: Secondary | ICD-10-CM | POA: Diagnosis not present

## 2017-01-26 DIAGNOSIS — R7981 Abnormal blood-gas level: Secondary | ICD-10-CM

## 2017-01-26 DIAGNOSIS — J440 Chronic obstructive pulmonary disease with acute lower respiratory infection: Secondary | ICD-10-CM | POA: Diagnosis not present

## 2017-01-26 DIAGNOSIS — J439 Emphysema, unspecified: Secondary | ICD-10-CM | POA: Diagnosis not present

## 2017-01-26 DIAGNOSIS — Z720 Tobacco use: Secondary | ICD-10-CM | POA: Diagnosis present

## 2017-01-26 DIAGNOSIS — Z8619 Personal history of other infectious and parasitic diseases: Secondary | ICD-10-CM | POA: Diagnosis present

## 2017-01-26 HISTORY — PX: PROCTOSCOPY: SHX2266

## 2017-01-26 HISTORY — PX: HEMORRHOID SURGERY: SHX153

## 2017-01-26 LAB — TYPE AND SCREEN
ABO/RH(D): AB POS
ANTIBODY SCREEN: NEGATIVE

## 2017-01-26 SURGERY — COLECTOMY, PARTIAL, ROBOT-ASSISTED, LAPAROSCOPIC
Anesthesia: Monitor Anesthesia Care

## 2017-01-26 MED ORDER — KETAMINE HCL 10 MG/ML IJ SOLN
INTRAMUSCULAR | Status: DC | PRN
  Administered 2017-01-26: 5 mg via INTRAVENOUS
  Administered 2017-01-26 (×3): 10 mg via INTRAVENOUS
  Administered 2017-01-26: 5 mg via INTRAVENOUS
  Administered 2017-01-26: 10 mg via INTRAVENOUS
  Administered 2017-01-26: 30 mg via INTRAVENOUS

## 2017-01-26 MED ORDER — ATENOLOL 50 MG PO TABS
50.0000 mg | ORAL_TABLET | Freq: Every evening | ORAL | Status: DC
  Administered 2017-01-27 – 2017-01-30 (×4): 50 mg via ORAL
  Filled 2017-01-26 (×5): qty 1
  Filled 2017-01-26: qty 2

## 2017-01-26 MED ORDER — CHLORHEXIDINE GLUCONATE 4 % EX LIQD
60.0000 mL | Freq: Once | CUTANEOUS | Status: DC
Start: 2017-01-27 — End: 2017-01-26

## 2017-01-26 MED ORDER — PROPOFOL 10 MG/ML IV BOLUS
INTRAVENOUS | Status: DC | PRN
  Administered 2017-01-26: 150 mg via INTRAVENOUS

## 2017-01-26 MED ORDER — ASPIRIN EC 81 MG PO TBEC
81.0000 mg | DELAYED_RELEASE_TABLET | Freq: Every day | ORAL | Status: DC
  Administered 2017-01-26 – 2017-01-31 (×6): 81 mg via ORAL
  Filled 2017-01-26 (×6): qty 1

## 2017-01-26 MED ORDER — DEXAMETHASONE SODIUM PHOSPHATE 10 MG/ML IJ SOLN
INTRAMUSCULAR | Status: DC | PRN
  Administered 2017-01-26: 10 mg via INTRAVENOUS

## 2017-01-26 MED ORDER — PHENOL 1.4 % MT LIQD: 2.0000 | OROMUCOSAL | Status: DC | PRN

## 2017-01-26 MED ORDER — GABAPENTIN 300 MG PO CAPS
300.0000 mg | ORAL_CAPSULE | ORAL | Status: AC
  Administered 2017-01-26: 300 mg via ORAL
  Filled 2017-01-26: qty 1

## 2017-01-26 MED ORDER — CLINDAMYCIN PHOSPHATE 900 MG/6ML IJ SOLN
INTRAMUSCULAR | Status: DC | PRN
  Administered 2017-01-26: 900 mg via INTRAMUSCULAR

## 2017-01-26 MED ORDER — ALVIMOPAN 12 MG PO CAPS
12.0000 mg | ORAL_CAPSULE | Freq: Two times a day (BID) | ORAL | Status: DC
  Administered 2017-01-27 – 2017-01-28 (×3): 12 mg via ORAL
  Filled 2017-01-26 (×3): qty 1

## 2017-01-26 MED ORDER — CHLORHEXIDINE GLUCONATE 4 % EX LIQD: 60.0000 mL | Freq: Once | CUTANEOUS | Status: DC

## 2017-01-26 MED ORDER — STERILE WATER FOR IRRIGATION IR SOLN
Status: DC | PRN
  Administered 2017-01-26: 2000 mL

## 2017-01-26 MED ORDER — ROCURONIUM BROMIDE 50 MG/5ML IV SOSY
PREFILLED_SYRINGE | INTRAVENOUS | Status: AC
  Filled 2017-01-26: qty 5

## 2017-01-26 MED ORDER — ONDANSETRON HCL 4 MG/2ML IJ SOLN
INTRAMUSCULAR | Status: AC
  Filled 2017-01-26: qty 2

## 2017-01-26 MED ORDER — EPHEDRINE 5 MG/ML INJ
INTRAVENOUS | Status: AC
Start: 2017-01-26 — End: 2017-01-26
  Filled 2017-01-26: qty 10

## 2017-01-26 MED ORDER — BUPIVACAINE LIPOSOME 1.3 % IJ SUSP
20.0000 mL | INTRAMUSCULAR | Status: DC
  Filled 2017-01-26: qty 20

## 2017-01-26 MED ORDER — OXYCODONE HCL 5 MG/5ML PO SOLN
5.0000 mg | Freq: Once | ORAL | Status: DC | PRN
  Filled 2017-01-26: qty 5

## 2017-01-26 MED ORDER — MIDAZOLAM HCL 2 MG/2ML IJ SOLN
INTRAMUSCULAR | Status: AC
  Filled 2017-01-26: qty 2

## 2017-01-26 MED ORDER — LACTATED RINGERS IV SOLN
INTRAVENOUS | Status: DC
  Administered 2017-01-26 (×3): via INTRAVENOUS

## 2017-01-26 MED ORDER — ACETAMINOPHEN 500 MG PO TABS
1000.0000 mg | ORAL_TABLET | Freq: Three times a day (TID) | ORAL | Status: DC
  Administered 2017-01-26 – 2017-01-31 (×14): 1000 mg via ORAL
  Filled 2017-01-26 (×15): qty 2

## 2017-01-26 MED ORDER — METHOCARBAMOL 1000 MG/10ML IJ SOLN
1000.0000 mg | Freq: Four times a day (QID) | INTRAVENOUS | Status: DC | PRN
  Administered 2017-01-26: 1000 mg via INTRAVENOUS
  Filled 2017-01-26 (×3): qty 10

## 2017-01-26 MED ORDER — LIDOCAINE 2% (20 MG/ML) 5 ML SYRINGE
INTRAMUSCULAR | Status: DC | PRN
  Administered 2017-01-26: 1.5 mg/kg/h via INTRAVENOUS

## 2017-01-26 MED ORDER — LACTATED RINGERS IR SOLN
Status: DC | PRN
  Administered 2017-01-26: 1000 mL

## 2017-01-26 MED ORDER — WITCH HAZEL-GLYCERIN EX PADS
1.0000 "application " | MEDICATED_PAD | CUTANEOUS | Status: DC | PRN
  Filled 2017-01-26: qty 100

## 2017-01-26 MED ORDER — OXYCODONE HCL 5 MG PO TABS
5.0000 mg | ORAL_TABLET | ORAL | Status: DC | PRN
  Administered 2017-01-27 – 2017-01-29 (×4): 5 mg via ORAL
  Filled 2017-01-26 (×4): qty 1

## 2017-01-26 MED ORDER — DEXTROSE 5 % IV SOLN
2.0000 g | Freq: Two times a day (BID) | INTRAVENOUS | Status: AC
  Administered 2017-01-26: 2 g via INTRAVENOUS
  Filled 2017-01-26: qty 2

## 2017-01-26 MED ORDER — ALUM & MAG HYDROXIDE-SIMETH 200-200-20 MG/5ML PO SUSP: 30.0000 mL | Freq: Four times a day (QID) | ORAL | Status: DC | PRN

## 2017-01-26 MED ORDER — BUPIVACAINE-EPINEPHRINE (PF) 0.5% -1:200000 IJ SOLN
INTRAMUSCULAR | Status: AC
  Filled 2017-01-26: qty 30

## 2017-01-26 MED ORDER — METHOCARBAMOL 500 MG PO TABS: 1000.0000 mg | ORAL_TABLET | Freq: Four times a day (QID) | ORAL | Status: DC | PRN

## 2017-01-26 MED ORDER — LACTATED RINGERS IV SOLN
INTRAVENOUS | Status: DC
  Administered 2017-01-26: 19:00:00 via INTRAVENOUS

## 2017-01-26 MED ORDER — BUPIVACAINE LIPOSOME 1.3 % IJ SUSP
INTRAMUSCULAR | Status: DC | PRN
  Administered 2017-01-26: 20 mL

## 2017-01-26 MED ORDER — LIDOCAINE 2% (20 MG/ML) 5 ML SYRINGE
INTRAMUSCULAR | Status: AC
  Filled 2017-01-26: qty 5

## 2017-01-26 MED ORDER — ONDANSETRON HCL 4 MG/2ML IJ SOLN
INTRAMUSCULAR | Status: DC | PRN
  Administered 2017-01-26: 4 mg via INTRAVENOUS

## 2017-01-26 MED ORDER — SODIUM CHLORIDE 0.9% FLUSH
3.0000 mL | INTRAVENOUS | Status: DC | PRN
  Administered 2017-01-29: 3 mL via INTRAVENOUS
  Filled 2017-01-26: qty 3

## 2017-01-26 MED ORDER — MENTHOL 3 MG MT LOZG: 1.0000 | LOZENGE | OROMUCOSAL | Status: DC | PRN

## 2017-01-26 MED ORDER — EPHEDRINE SULFATE-NACL 50-0.9 MG/10ML-% IV SOSY
PREFILLED_SYRINGE | INTRAVENOUS | Status: DC | PRN
Start: 2017-01-26 — End: 2017-01-26
  Administered 2017-01-26 (×5): 10 mg via INTRAVENOUS

## 2017-01-26 MED ORDER — HYDROMORPHONE HCL 1 MG/ML IJ SOLN
INTRAMUSCULAR | Status: AC
  Filled 2017-01-26: qty 1

## 2017-01-26 MED ORDER — SUFENTANIL CITRATE 50 MCG/ML IV SOLN
INTRAVENOUS | Status: DC | PRN
Start: 2017-01-26 — End: 2017-01-26
  Administered 2017-01-26: 20 ug via INTRAVENOUS
  Administered 2017-01-26 (×2): 10 ug via INTRAVENOUS

## 2017-01-26 MED ORDER — CEFOTETAN DISODIUM-DEXTROSE 2-2.08 GM-% IV SOLR
2.0000 g | INTRAVENOUS | Status: AC
  Administered 2017-01-26: 2 g via INTRAVENOUS
  Filled 2017-01-26: qty 50

## 2017-01-26 MED ORDER — SUGAMMADEX SODIUM 200 MG/2ML IV SOLN
INTRAVENOUS | Status: AC
  Filled 2017-01-26: qty 2

## 2017-01-26 MED ORDER — DIVALPROEX SODIUM 250 MG PO DR TAB
500.0000 mg | DELAYED_RELEASE_TABLET | Freq: Two times a day (BID) | ORAL | Status: DC
  Administered 2017-01-26 – 2017-01-31 (×10): 500 mg via ORAL
  Filled 2017-01-26 (×10): qty 2

## 2017-01-26 MED ORDER — MAGIC MOUTHWASH
15.0000 mL | Freq: Four times a day (QID) | ORAL | Status: DC | PRN
  Filled 2017-01-26: qty 15

## 2017-01-26 MED ORDER — DEXTROSE 5 % IV SOLN
2.0000 g | INTRAVENOUS | Status: DC
  Filled 2017-01-26 (×2): qty 2

## 2017-01-26 MED ORDER — ZOLPIDEM TARTRATE 5 MG PO TABS: 5.0000 mg | ORAL_TABLET | Freq: Every evening | ORAL | Status: DC | PRN

## 2017-01-26 MED ORDER — PROPOFOL 10 MG/ML IV BOLUS
INTRAVENOUS | Status: AC
Start: 2017-01-26 — End: 2017-01-26
  Filled 2017-01-26: qty 20

## 2017-01-26 MED ORDER — BUPIVACAINE-EPINEPHRINE 0.5% -1:200000 IJ SOLN
INTRAMUSCULAR | Status: DC | PRN
  Administered 2017-01-26: 23 mL

## 2017-01-26 MED ORDER — ALVIMOPAN 12 MG PO CAPS
12.0000 mg | ORAL_CAPSULE | Freq: Once | ORAL | Status: AC
  Administered 2017-01-26: 12 mg via ORAL
  Filled 2017-01-26: qty 1

## 2017-01-26 MED ORDER — DEXAMETHASONE SODIUM PHOSPHATE 10 MG/ML IJ SOLN
INTRAMUSCULAR | Status: AC
  Filled 2017-01-26: qty 1

## 2017-01-26 MED ORDER — MAGNESIUM OXIDE 400 (241.3 MG) MG PO TABS
200.0000 mg | ORAL_TABLET | Freq: Every day | ORAL | Status: DC
  Administered 2017-01-26 – 2017-01-30 (×5): 200 mg via ORAL
  Filled 2017-01-26 (×6): qty 1

## 2017-01-26 MED ORDER — ACETAMINOPHEN 500 MG PO TABS
1000.0000 mg | ORAL_TABLET | ORAL | Status: AC
  Administered 2017-01-26: 1000 mg via ORAL
  Filled 2017-01-26: qty 2

## 2017-01-26 MED ORDER — SODIUM CHLORIDE 0.9 % IJ SOLN
INTRAMUSCULAR | Status: DC | PRN
  Administered 2017-01-26: 43 mL

## 2017-01-26 MED ORDER — ONDANSETRON HCL 4 MG/2ML IJ SOLN
4.0000 mg | Freq: Four times a day (QID) | INTRAMUSCULAR | Status: DC | PRN
  Administered 2017-01-26: 4 mg via INTRAVENOUS
  Filled 2017-01-26: qty 2

## 2017-01-26 MED ORDER — FENTANYL CITRATE (PF) 100 MCG/2ML IJ SOLN
12.5000 ug | INTRAMUSCULAR | Status: DC | PRN
  Administered 2017-01-26 – 2017-01-27 (×3): 25 ug via INTRAVENOUS
  Filled 2017-01-26 (×3): qty 2

## 2017-01-26 MED ORDER — CEFOTETAN DISODIUM-DEXTROSE 2-2.08 GM-% IV SOLR
INTRAVENOUS | Status: AC
  Filled 2017-01-26: qty 50

## 2017-01-26 MED ORDER — ENOXAPARIN SODIUM 40 MG/0.4ML ~~LOC~~ SOLN
40.0000 mg | SUBCUTANEOUS | Status: DC
  Administered 2017-01-27 – 2017-01-31 (×5): 40 mg via SUBCUTANEOUS
  Filled 2017-01-26 (×5): qty 0.4

## 2017-01-26 MED ORDER — SACCHAROMYCES BOULARDII 250 MG PO CAPS
250.0000 mg | ORAL_CAPSULE | Freq: Two times a day (BID) | ORAL | Status: DC
  Administered 2017-01-26 – 2017-01-31 (×10): 250 mg via ORAL
  Filled 2017-01-26 (×10): qty 1

## 2017-01-26 MED ORDER — OXYCODONE HCL 5 MG PO TABS: 2.5000 mg | ORAL_TABLET | Freq: Four times a day (QID) | ORAL | 0 refills | Status: DC | PRN

## 2017-01-26 MED ORDER — OXYCODONE HCL 5 MG PO TABS: 5.0000 mg | ORAL_TABLET | Freq: Once | ORAL | Status: DC | PRN

## 2017-01-26 MED ORDER — MIDAZOLAM HCL 2 MG/2ML IJ SOLN
INTRAMUSCULAR | Status: DC | PRN
  Administered 2017-01-26: 2 mg via INTRAVENOUS

## 2017-01-26 MED ORDER — LIDOCAINE 2% (20 MG/ML) 5 ML SYRINGE
INTRAMUSCULAR | Status: DC | PRN
  Administered 2017-01-26: 100 mg via INTRAVENOUS

## 2017-01-26 MED ORDER — METOPROLOL TARTRATE 5 MG/5ML IV SOLN
5.0000 mg | Freq: Four times a day (QID) | INTRAVENOUS | Status: DC | PRN
  Filled 2017-01-26: qty 5

## 2017-01-26 MED ORDER — HYDROCORTISONE ACE-PRAMOXINE 2.5-1 % RE CREA
1.0000 "application " | TOPICAL_CREAM | Freq: Four times a day (QID) | RECTAL | Status: DC | PRN
  Filled 2017-01-26: qty 30

## 2017-01-26 MED ORDER — LIDOCAINE 2% (20 MG/ML) 5 ML SYRINGE
INTRAMUSCULAR | Status: AC
  Filled 2017-01-26: qty 10

## 2017-01-26 MED ORDER — PROCHLORPERAZINE EDISYLATE 5 MG/ML IJ SOLN: 5.0000 mg | INTRAMUSCULAR | Status: DC | PRN

## 2017-01-26 MED ORDER — DIPHENHYDRAMINE HCL 12.5 MG/5ML PO ELIX: 12.5000 mg | ORAL_SOLUTION | Freq: Four times a day (QID) | ORAL | Status: DC | PRN

## 2017-01-26 MED ORDER — LIP MEDEX EX OINT
1.0000 "application " | TOPICAL_OINTMENT | Freq: Two times a day (BID) | CUTANEOUS | Status: DC
  Administered 2017-01-26 – 2017-01-31 (×9): 1 via TOPICAL
  Filled 2017-01-26 (×5): qty 7

## 2017-01-26 MED ORDER — LACTATED RINGERS IV BOLUS (SEPSIS): 1000.0000 mL | Freq: Three times a day (TID) | INTRAVENOUS | Status: AC | PRN

## 2017-01-26 MED ORDER — ENOXAPARIN SODIUM 40 MG/0.4ML ~~LOC~~ SOLN
40.0000 mg | Freq: Once | SUBCUTANEOUS | Status: AC
  Administered 2017-01-26: 40 mg via SUBCUTANEOUS
  Filled 2017-01-26: qty 0.4

## 2017-01-26 MED ORDER — VITAMIN D 1000 UNITS PO TABS
1000.0000 [IU] | ORAL_TABLET | Freq: Every day | ORAL | Status: DC
  Administered 2017-01-26 – 2017-01-31 (×6): 1000 [IU] via ORAL
  Filled 2017-01-26 (×6): qty 1

## 2017-01-26 MED ORDER — AMLODIPINE BESYLATE 5 MG PO TABS
5.0000 mg | ORAL_TABLET | Freq: Every evening | ORAL | Status: DC
  Administered 2017-01-27 – 2017-01-30 (×4): 5 mg via ORAL
  Filled 2017-01-26 (×5): qty 1

## 2017-01-26 MED ORDER — DIPHENHYDRAMINE HCL 50 MG/ML IJ SOLN: 12.5000 mg | Freq: Four times a day (QID) | INTRAMUSCULAR | Status: DC | PRN

## 2017-01-26 MED ORDER — KETAMINE HCL 10 MG/ML IJ SOLN
INTRAMUSCULAR | Status: AC
  Filled 2017-01-26: qty 1

## 2017-01-26 MED ORDER — SUGAMMADEX SODIUM 200 MG/2ML IV SOLN
INTRAVENOUS | Status: DC | PRN
  Administered 2017-01-26: 200 mg via INTRAVENOUS

## 2017-01-26 MED ORDER — SODIUM CHLORIDE 0.9 % IJ SOLN
INTRAMUSCULAR | Status: AC
  Filled 2017-01-26: qty 10

## 2017-01-26 MED ORDER — SODIUM CHLORIDE 0.9% FLUSH
3.0000 mL | Freq: Two times a day (BID) | INTRAVENOUS | Status: DC
  Administered 2017-01-27 – 2017-01-31 (×8): 3 mL via INTRAVENOUS

## 2017-01-26 MED ORDER — SODIUM CHLORIDE 0.9 % IJ SOLN
INTRAMUSCULAR | Status: AC
  Filled 2017-01-26: qty 50

## 2017-01-26 MED ORDER — PROMETHAZINE HCL 25 MG/ML IJ SOLN
6.2500 mg | INTRAMUSCULAR | Status: DC | PRN
Start: 2017-01-26 — End: 2017-01-26

## 2017-01-26 MED ORDER — MEPERIDINE HCL 50 MG/ML IJ SOLN: 6.2500 mg | INTRAMUSCULAR | Status: DC | PRN

## 2017-01-26 MED ORDER — ROCURONIUM BROMIDE 10 MG/ML (PF) SYRINGE
PREFILLED_SYRINGE | INTRAVENOUS | Status: DC | PRN
  Administered 2017-01-26: 20 mg via INTRAVENOUS
  Administered 2017-01-26: 30 mg via INTRAVENOUS
  Administered 2017-01-26: 50 mg via INTRAVENOUS

## 2017-01-26 MED ORDER — HYDROMORPHONE HCL 1 MG/ML IJ SOLN
0.2500 mg | INTRAMUSCULAR | Status: DC | PRN
  Administered 2017-01-26 (×2): 0.5 mg via INTRAVENOUS

## 2017-01-26 MED ORDER — ONDANSETRON HCL 4 MG PO TABS
4.0000 mg | ORAL_TABLET | Freq: Four times a day (QID) | ORAL | Status: DC | PRN
Start: 2017-01-26 — End: 2017-01-31

## 2017-01-26 MED ORDER — SUCCINYLCHOLINE CHLORIDE 200 MG/10ML IV SOSY
PREFILLED_SYRINGE | INTRAVENOUS | Status: AC
  Filled 2017-01-26: qty 10

## 2017-01-26 MED ORDER — ENALAPRILAT 1.25 MG/ML IV SOLN
0.6250 mg | Freq: Four times a day (QID) | INTRAVENOUS | Status: DC | PRN
  Filled 2017-01-26: qty 1

## 2017-01-26 MED ORDER — SODIUM CHLORIDE 0.9 % IV SOLN: 250.0000 mL | INTRAVENOUS | Status: DC | PRN

## 2017-01-26 MED ORDER — SUFENTANIL CITRATE 50 MCG/ML IV SOLN
INTRAVENOUS | Status: AC
  Filled 2017-01-26: qty 1

## 2017-01-26 SURGICAL SUPPLY — 101 items
APPLIER CLIP 5 13 M/L LIGAMAX5 (MISCELLANEOUS)
APPLIER CLIP ROT 10 11.4 M/L (STAPLE)
BLADE EXTENDED COATED 6.5IN (ELECTRODE) ×3 IMPLANT
CANNULA REDUC XI 12-8 STAPL (CANNULA) ×1
CANNULA REDUC XI 12-8MM STAPL (CANNULA) ×1
CANNULA REDUCER 12-8 DVNC XI (CANNULA) ×1 IMPLANT
CELLS DAT CNTRL 66122 CELL SVR (MISCELLANEOUS) IMPLANT
CHLORAPREP W/TINT 26ML (MISCELLANEOUS) IMPLANT
CLIP APPLIE 5 13 M/L LIGAMAX5 (MISCELLANEOUS) IMPLANT
CLIP APPLIE ROT 10 11.4 M/L (STAPLE) IMPLANT
CLIP LIGATING HEM O LOK PURPLE (MISCELLANEOUS) IMPLANT
CLIP LIGATING HEMO O LOK GREEN (MISCELLANEOUS) IMPLANT
COUNTER NEEDLE 20 DBL MAG RED (NEEDLE) ×3 IMPLANT
COVER MAYO STAND STRL (DRAPES) ×3 IMPLANT
COVER TIP SHEARS 8 DVNC (MISCELLANEOUS) ×1 IMPLANT
COVER TIP SHEARS 8MM DA VINCI (MISCELLANEOUS) ×2
DECANTER SPIKE VIAL GLASS SM (MISCELLANEOUS) IMPLANT
DEVICE TROCAR PUNCTURE CLOSURE (ENDOMECHANICALS) ×3 IMPLANT
DRAIN CHANNEL 19F RND (DRAIN) ×3 IMPLANT
DRAPE ARM DVNC X/XI (DISPOSABLE) ×4 IMPLANT
DRAPE COLUMN DVNC XI (DISPOSABLE) ×1 IMPLANT
DRAPE DA VINCI XI ARM (DISPOSABLE) ×8
DRAPE DA VINCI XI COLUMN (DISPOSABLE) ×2
DRAPE SURG IRRIG POUCH 19X23 (DRAPES) ×3 IMPLANT
DRSG OPSITE POSTOP 4X10 (GAUZE/BANDAGES/DRESSINGS) IMPLANT
DRSG OPSITE POSTOP 4X6 (GAUZE/BANDAGES/DRESSINGS) ×6 IMPLANT
DRSG OPSITE POSTOP 4X8 (GAUZE/BANDAGES/DRESSINGS) IMPLANT
DRSG TEGADERM 2-3/8X2-3/4 SM (GAUZE/BANDAGES/DRESSINGS) ×12 IMPLANT
DRSG TEGADERM 4X4.75 (GAUZE/BANDAGES/DRESSINGS) IMPLANT
ELECT PENCIL ROCKER SW 15FT (MISCELLANEOUS) ×3 IMPLANT
ELECT REM PT RETURN 15FT ADLT (MISCELLANEOUS) ×3 IMPLANT
ENDOLOOP SUT PDS II  0 18 (SUTURE)
ENDOLOOP SUT PDS II 0 18 (SUTURE) IMPLANT
EVACUATOR SILICONE 100CC (DRAIN) ×3 IMPLANT
GAUZE SPONGE 2X2 8PLY STRL LF (GAUZE/BANDAGES/DRESSINGS) ×1 IMPLANT
GAUZE SPONGE 4X4 12PLY STRL (GAUZE/BANDAGES/DRESSINGS) IMPLANT
GLOVE ECLIPSE 8.0 STRL XLNG CF (GLOVE) ×9 IMPLANT
GLOVE INDICATOR 8.0 STRL GRN (GLOVE) ×9 IMPLANT
GOWN STRL REUS W/TWL XL LVL3 (GOWN DISPOSABLE) ×15 IMPLANT
GRASPER ENDOPATH ANVIL 10MM (MISCELLANEOUS) IMPLANT
HOLDER FOLEY CATH W/STRAP (MISCELLANEOUS) ×3 IMPLANT
IRRIG SUCT STRYKERFLOW 2 WTIP (MISCELLANEOUS) ×3
IRRIGATION SUCT STRKRFLW 2 WTP (MISCELLANEOUS) ×1 IMPLANT
KIT PROCEDURE DA VINCI SI (MISCELLANEOUS)
KIT PROCEDURE DVNC SI (MISCELLANEOUS) IMPLANT
LEGGING LITHOTOMY PAIR STRL (DRAPES) IMPLANT
LUBRICANT JELLY K Y 4OZ (MISCELLANEOUS) ×3 IMPLANT
NEEDLE INSUFFLATION 14GA 120MM (NEEDLE) ×3 IMPLANT
PACK CARDIOVASCULAR III (CUSTOM PROCEDURE TRAY) ×3 IMPLANT
PACK COLON (CUSTOM PROCEDURE TRAY) ×3 IMPLANT
PAD POSITIONING PINK XL (MISCELLANEOUS) ×3 IMPLANT
PORT LAP GEL ALEXIS MED 5-9CM (MISCELLANEOUS) IMPLANT
RTRCTR WOUND ALEXIS 18CM MED (MISCELLANEOUS)
SCISSORS LAP 5X35 DISP (ENDOMECHANICALS) ×3 IMPLANT
SEAL CANN UNIV 5-8 DVNC XI (MISCELLANEOUS) ×3 IMPLANT
SEAL XI 5MM-8MM UNIVERSAL (MISCELLANEOUS) ×6
SEALER VESSEL DA VINCI XI (MISCELLANEOUS) ×2
SEALER VESSEL EXT DVNC XI (MISCELLANEOUS) ×1 IMPLANT
SLEEVE ADV FIXATION 5X100MM (TROCAR) ×3 IMPLANT
SOLUTION ELECTROLUBE (MISCELLANEOUS) ×3 IMPLANT
SPONGE GAUZE 2X2 STER 10/PKG (GAUZE/BANDAGES/DRESSINGS) ×2
STAPLER 45 BLU RELOAD XI (STAPLE) IMPLANT
STAPLER 45 BLUE RELOAD XI (STAPLE)
STAPLER 45 GREEN RELOAD XI (STAPLE) ×2
STAPLER 45 GRN RELOAD XI (STAPLE) ×1 IMPLANT
STAPLER CANNULA SEAL DVNC XI (STAPLE) ×1 IMPLANT
STAPLER CANNULA SEAL XI (STAPLE) ×2
STAPLER CIRC ILS CVD 33MM 37CM (STAPLE) ×3 IMPLANT
STAPLER SHEATH (SHEATH) ×2
STAPLER SHEATH ENDOWRIST DVNC (SHEATH) ×1 IMPLANT
SUT MNCRL AB 4-0 PS2 18 (SUTURE) ×3 IMPLANT
SUT PDS AB 1 CTX 36 (SUTURE) ×6 IMPLANT
SUT PDS AB 1 TP1 96 (SUTURE) IMPLANT
SUT PDS AB 2-0 CT2 27 (SUTURE) IMPLANT
SUT PROLENE 0 CT 2 (SUTURE) ×3 IMPLANT
SUT PROLENE 2 0 KS (SUTURE) ×3 IMPLANT
SUT PROLENE 2 0 SH DA (SUTURE) IMPLANT
SUT SILK 2 0 (SUTURE) ×2
SUT SILK 2 0 SH CR/8 (SUTURE) ×3 IMPLANT
SUT SILK 2-0 18XBRD TIE 12 (SUTURE) ×1 IMPLANT
SUT SILK 3 0 (SUTURE) ×2
SUT SILK 3 0 SH CR/8 (SUTURE) ×3 IMPLANT
SUT SILK 3-0 18XBRD TIE 12 (SUTURE) ×1 IMPLANT
SUT V-LOC BARB 180 2/0GR6 GS22 (SUTURE) ×6
SUT VIC AB 2-0 UR6 27 (SUTURE) ×3 IMPLANT
SUT VIC AB 3-0 SH 18 (SUTURE) IMPLANT
SUT VIC AB 3-0 SH 27 (SUTURE)
SUT VIC AB 3-0 SH 27XBRD (SUTURE) IMPLANT
SUT VICRYL 0 UR6 27IN ABS (SUTURE) ×6 IMPLANT
SUTURE V-LC BRB 180 2/0GR6GS22 (SUTURE) ×2 IMPLANT
SYR 10ML LL (SYRINGE) ×3 IMPLANT
SYS LAPSCP GELPORT 120MM (MISCELLANEOUS)
SYSTEM LAPSCP GELPORT 120MM (MISCELLANEOUS) IMPLANT
TAPE UMBILICAL COTTON 1/8X30 (MISCELLANEOUS) ×3 IMPLANT
TOWEL OR 17X26 10 PK STRL BLUE (TOWEL DISPOSABLE) IMPLANT
TOWEL OR NON WOVEN STRL DISP B (DISPOSABLE) ×3 IMPLANT
TRAY FOLEY W/METER SILVER 16FR (SET/KITS/TRAYS/PACK) ×3 IMPLANT
TROCAR ADV FIXATION 5X100MM (TROCAR) IMPLANT
TUBING CONNECTING 10 (TUBING) ×2 IMPLANT
TUBING CONNECTING 10' (TUBING) ×1
TUBING INSUFFLATION 10FT LAP (TUBING) ×3 IMPLANT

## 2017-01-26 NOTE — Interval H&P Note (Signed)
History and Physical Interval Note:  01/26/2017 8:20 AM  [name] [name]  has presented today for surgery, with the diagnosis of Rectal prolapse  The various methods of treatment have been discussed with the patient and family. After consideration of risks, benefits and other options for treatment, the patient has consented to  Procedure(s): XI ROBOT RESECTION OF SIGMOID COLON RECTOPEXY (N/A) RIGID PROCTOSCOPY (N/A) as a surgical intervention .  The patient's history has been reviewed, patient examined, no change in status, stable for surgery.  I have reviewed the patient's chart and labs.  Questions were answered to the patient's satisfaction.     Elesia Pemberton C.

## 2017-01-26 NOTE — Anesthesia Postprocedure Evaluation (Signed)
Anesthesia Post Note  Patient: [name] [name]  Procedure(s) Performed: Procedure(s) (LRB): XI ROBOT LOW ANTERIOR RESECTION WITH RECTOPEXY (N/A) RIGID PROCTOSCOPY (N/A) HEMORRHOIDECTOMY AND LIGATION (N/A)  Patient location during evaluation: PACU Anesthesia Type: MAC Level of consciousness: awake and alert Pain management: pain level controlled Vital Signs Assessment: post-procedure vital signs reviewed and stable Respiratory status: spontaneous breathing, nonlabored ventilation and respiratory function stable Cardiovascular status: blood pressure returned to baseline and stable Postop Assessment: no signs of nausea or vomiting Anesthetic complications: no       Last Vitals:  Vitals:   01/26/17 0628 01/26/17 1240  BP: 132/76 (!) 157/79  Pulse: 60 (!) 55  Resp: 18 (!) 6  Temp: 36.4 C 36.4 C    Last Pain:  Vitals:   01/26/17 1240  TempSrc:   PainSc: Asleep                 Lynda Rainwater

## 2017-01-26 NOTE — Op Note (Signed)
01/26/2017  12:40 PM  PATIENT:  [name] [name]  66 y.o. male  Patient Care Team: Sinda Du, MD as PCP - General (Internal Medicine) Daneil Dolin, MD as Consulting Physician (Gastroenterology) Michael Boston, MD as Consulting Physician (General Surgery) Josue Hector, MD as Consulting Physician (Cardiology)  PRE-OPERATIVE DIAGNOSIS:  Rectal prolapse  POST-OPERATIVE DIAGNOSIS:    Rectal prolapse Grade 3 internal prolapsing hemorrhoids  PROCEDURE:   XI ROBOTIC LOW ANTERIOR RESECTION RECTOPEXY RIGID PROCTOSCOPY HEMORRHOIDECTOMY INTERNAL HEMORRHOIDAL LIGATION  SURGEON:  Adin Hector, MD  ASSISTANT: Leighton Ruff, MD    ANESTHESIA:   local and general  EBL:  Total I/O In: 1000 [I.V.:1000] Out: 250 [Urine:150; Blood:100]  Delay start of Pharmacological VTE agent (>24hrs) due to surgical blood loss or risk of bleeding:  no  DRAINS: (19 Fr) Blake drain(s) in the PELVIS   SPECIMEN:  1.  RECTOSIGMOID COLON  2.  RIGHT POSTERIOR INTERNAL HEMORRHOID  DISPOSITION OF SPECIMEN:  PATHOLOGY  COUNTS:  YES  PLAN OF CARE: Admit to inpatient   PATIENT DISPOSITION:  PACU - hemodynamically stable.  INDICATION:    Patient struggling with fecal incontinence due to complete mucosal rectal prolapse.  Some enlarged hemorrhoids.  I recommended resection and rectopexy.  I recommended segmental resection:  The anatomy & physiology of the digestive tract was discussed.  The pathophysiology was discussed.  Natural history risks without surgery was discussed.   I worked to give an overview of the disease and the frequent need to have multispecialty involvement.  I feel the risks of no intervention will lead to serious problems that outweigh the operative risks; therefore, I recommended a partial colectomy to remove the pathology.  Laparoscopic & open techniques were discussed.   Risks such as bleeding, infection, abscess, leak, reoperation, possible ostomy, hernia, heart attack, death,  and other risks were discussed.  I noted a good likelihood this will help address the problem.   Goals of post-operative recovery were discussed as well.  We will work to minimize complications.  Educational materials on the pathology had been given in the office.  Questions were answered.    The patient expressed understanding & wished to proceed with surgery.  OR FINDINGS:   Patient had some redundant rectosigmoid colon with about 5 cm circumferential prolapse.  No obvious metastatic disease on visceral parietal peritoneum or liver.  The anastomosis rests 12 cm from the anal verge by rigid proctoscopy.  Patient had persistent right posterior large prolapsing internal hemorrhoid.  Ligated and excised.  Left lateral internal hemorrhoid and large as well ligated.  Pexy done.  Anatomy much improved.  PROCEDURE:   Informed consent was confirmed.  Patient underwent general anesthesia without any difficulty & was positioned in low lithotomy with arms tucked.  The patient had a Foley catheter sterilely placed.  The abdomen and perineum were prepped and draped in sterile fashion.  Surgical time-out confirmed our plan.  I placed a 8-mm robotic port in the left upper quadrant using Varess technique with the patient in steep reverse Trendelenburg and left sideup.  The patient had orogastric tube for decompression.  Entry was clean.  We induced carbon dioxide insufflation.  Port placed.  All liver puncture.  Extra ports placed carefully under direct visualization.  Hemostasis done on the needle puncture site of the liver.  We positioned the patient head down and evacuated the greater omentum and the upper abdomen.  There are adhesions a small down to the pelvis.  Went ahead and  docked to the neck site robot carefully.  I turned attention to the pelvis.  I did robotic lysis adhesions to free numerous small bowel loops off its adhesions to the pelvis.  Eventually was able to reduce the small bowel.  I  mobilized the rectosigmoid colon in a lateral to medial fashion as there were some side adhesions to it along the left pelvic brim and peritoneal reflection.  With that I could better straight out the rectosigmoid colon.  I scored the peritoneum at the rectosigmoid region at the level of the sacral promontory and continued scoring of peritoneum using hook cautery down to the peritoneal reflection.  I left the rectosigmoid mesentery anteriorly and got into the presacral plane.  I followed blunt dissection down the sacrum and down towards the levators for good posterior rectal mobilzation.  Tentative mobilizing some of the descending and sigmoid colon to help straighten it out as the mid sigmoid was thinned out with adhesions and poor quality.  I dissected more proximally.  I elevated the sigmoid mesentery to identify the retroperitoneum and in the presacral plane.  Rectosigmoid colon was rather adherent to the left side of the pelvis.  Was rather broad pelvis, especially for a male.  I identified the left & right ureters, kept them posteriorly in the retroperitioneum.  I identified the left gonadals and kept those posterior as well.  I tried to avoid any more dissection more proximally to avoid disturbing any further tensions of the left colon to the left pericolic gutter.   I then took some of the peritoneal reflection on the left rectosigmoid side going up the pelvic brim to the anterior reflection.  I focused on mobilizing the posterior mesorectum off the presacral space.  Gradually elevated and was able to reduce the chronic rectal prolapse back in to the pelvis.  Alternated between anterior and posterior dissection, sparing the lateral pedicles.  I then came around anteriorly to help mobilize the anterior rectum off its anterior attachments to the seminal vesicles & prostatel all the way down to the pelvic floor for good anterior rectal mobilization.  With that, I could get the distal/mid rectum to stretch up  to the sacral promontory to help straighten out the stretched out rectum.  I made an effort to keep the lateral pedicles intact on both sides at mid and distal rectum.  I created a window between the proximal and mid rectal regions where it reached to the sacral promontory under some mild tension.  I transected the mesorectum at the rectosigmoid junction and peeled the mesorectum down to the mid rectum to ensure good blood supply.  I transected at the mid rectum that could reach up to the sacral promontory under tension with a green load firing robotic stapler.  I then brought down the descending/sigmoid junction of colon as it could reach to the sacral promontory.  I transected the mesentery radially, making an effort to spare most of the mesenteric blood supply.  Again I came to the rectosigmoid junction and skeletonized the mesentery up to the descending/sigmoid junction where it reached more easily to preserve extra mesentery and eliminate any rectosigmoid redundancy.  I placed 2-0 V-lock serrated absorbable sutures through the sacral promontory on the right and left side.  Brought it up the posterior rectal wall & mesentery.  Looped that in but did not tie it fully down yet.   I created an extraction incision through a small Pfannenstiel incision in the suprapubic region.  Placed a wound  protector.  I was able to eviscerate the rectosigmoid colon out the wound.   I clamped the colon proximal to this area using a reusable pursestringer device.  Passed a 2-0 Keith needle. I transected at the descending/sigmoid junction with a scalpel. I got healthy bleeding mucosa.  We sent the rectosigmoid colon specimen off to go to pathology.  We sized the colon orifice.  It cramped down some but easily stretched out.  Therefore, I chose a 33 EEA anvil stapler system.  I reinforced the prolene pursestring with interrupted silk suture.  I placed the anvil to the open end of the proximal remaining colon and closed around it  using the pursestring.    We did copious irrigation with crystalloid solution.  Hemostasis was good.  The distal end of the remaining colon easily reached down to the rectal stump, therefore, splenic flexure mobilization was not needed.      Dr Marcello Moores scrubbed down and did gentle anal dilation and advanced the EEA stapler up the rectal stump. The spike was brought out at the provimal end of the rectal stump under direct visualization.  I attached the anvil of the proximal colon the spike of the stapler. Anvil was tightened down and held clamped for 60 seconds. The EEA stapler was fired and held clamped for 30 seconds. The stapler was released & removed. We noted 2 excellent anastomotic rings. Blue stitch is in the proximal ring.  Dr Eliot Ford did rigid proctoscopy noted the anastomosis was at 12 cm from the anal verge consistent with the proximal rectum.  We did a final irrigation of antibiotic solution (900 mg clindamycin/240 mg gentamicin in a liter of crystalloid) & held that for the pelvic air leak test .  The rectum was insufflated the rectum while clamping the colon proximal to that anastomosis.  There was a negative air leak test. There was no tension of mesentery or bowel at the anastomosis.   Tissues looked viable.  Ureters & bowel uninjured.  The anastomosis looked healthy.  The robot was re-docked.  I used the serrated V lock sutures to help do a rectopexy along the rectocolic anastomosis.  I ran it back more proximally to help close the peritoneal reflections and help suture along the proximal pelvis that had good fibrous tissue to help reinforce the pexy.  I placed a 72 French drain into the true deep posterior pelvis and around the anterior pelvis between the prostate and anterior rectum to help close the dead space.    I brought it out the 38mm robotic port after passing a 0 Vicryl suture to help close that down. The greater omentum was foreshortened with adhesions on the left colon.  I left those in  place.  Endoluminal gas was evacuated.  Ports & wound protector removed.  We changed gloves & redraped the patient per colon SSI prevention protocol.  We aspirated the antibiotic irrigation.  Hemostasis was good.  Sterile unused instruments were used from this point.  I closed the 54mm port sites using Monocryl stitch and sterile dressing.    Patient has small but definite periumbilical ventral hernia at her prior laparoscopic incision.  I primarily closed that using a #1 PDS interrupted suture from the Pfannenstiel wound.   I closed the extraction wound using a 0 Vicryl vertical peritoneal closure and a #1 PDS transverse anterior rectal fascial closure like a small Pfannenstiel closure. I closed the skin with some interrupted Monocryl stitches. I placed antibiotic-soaked wicks into the closure at  the corners x2.  I placed sterile dressings.     Patient still had persistent right posterior hemorrhoidal prolapse.  Emanation of the anorectal region or anesthesia.  Skin snug but there was still some redundant hemorrhoids.  Therefore did a 2-0 Vicryl.  Suture 6 cm proximal to anal verge in the right posterior location.  Ran it down somewhat.  Excised the large right posterior internal hemorrhoid and a longitudinal fusiform fashion.  Remove some external hemorrhoid.  Ran a suture and tied that down.  That help hold it in place.  Left lateral could prolapse easily as well.  I did not sure ligation and pexy without removal.  With that anorectal mucosa was reduced into the rectum with no more redundant rectal wall.  No evidence of any circumferential or hemorrhoidal prolapse.  Anorectal block had been done  Patient is being extubated go to recovery room. I had discussed postop care with the patient in detail the office & in the holding area. Instructions are written.I made an attempt to locate family to discuss patient's status and recommendations.  No one is available at this time.  I will try again later  Adin Hector, M.D., F.A.C.S. Gastrointestinal and Minimally Invasive Surgery [facility] [address] [place], [place] [address] [phone] Main / Paging  01/26/2017 12:51 PM

## 2017-01-26 NOTE — H&P (View-Only) (Signed)
[name] [name] 09/08/2016 11:03 AM Location: [facility] Patient #: [id number] DOB: 1950/12/10 Single / Language: [name] [name] / Race: White Male   History of Present Illness  The patient is a 66 year old male who presents with rectal prolapse. Note for "Rectal prolapse": Patient sent for surgical consultation aggressive [name] Kaufman, NP & Dr. Jannette [name] with [facility] gastroenterology. Concern for rectal prolapse and incontinence.  Pleasant smoking male. Comes today by himself. Has noticed for the past 10 months worsening prolapse of tissue and leaking out the anus. At the point where it is out all the time now. He'll get incontinence and accidents. Quite frustrating. No severe pain with that but often a lot of discomfort. Blood with wiping. Irritating with bowel movements. He denies any constipation. He claims moves his bowels about twice a day without straining. He does have some left calf pain that keeps him from walking more than 10 minutes at a time. No history of heart attacks or strokes. He's been told quit smoking. He's got down to about a half pack a day but cannot completely quit yet. On colonoscopy, the patient had some hyperplastic and adenomatous polyps removed last month. Found to have obvious rectal prolapse. Surgical consultation requested to see what could be done definitively.   Patient denies any history of any hemorrhoidal problems requiring hemorrhoid surgery are banding or interventions. No personal nor family history of GI/colon cancer, inflammatory bowel disease, irritable bowel syndrome, allergy such as Celiac Sprue, dietary/dairy problems, colitis, ulcers nor gastritis. No recent sick contacts/gastroenteritis. No travel outside the country. No changes in diet. No dysphagia to solids or liquids. No significant heartburn or reflux. No hematochezia, hematemesis, coffee ground emesis. No evidence of prior gastric/peptic ulceration.  Patient cleared  for surgery.  Related to consider surgical resection.  Diagnosis 1. Colon, polyp(s), ascending - TUBULAR ADENOMA. NO HIGH GRADE DYSPLASIA OR MALIGNANCY IDENTIFIED. 2. Rectum, polyp(s) - TUBULAR ADENOMA AND HYPERPLASTIC POLYP. NO HIGH GRADE DYSPLASIA OR MALIGNANCY IDENTIFIED. 3. Rectosigmoid , polyp - HYPERPLASTIC POLYP. NO ADENOMATOUS CHANGE OR MALIGNANCY. [name] Laws MD Pathologist, Electronic Signature (Case signed 08/24/2016) Specimen [name] [name] and Clinical Information Specimen(s) Obtained: 1. Colon, polyp(s), ascending 2. Rectum, polyp(s) 3. Rectosigmoid , polyp Specimen Clinical Information 3. Pre-op: rectal bleeding; Post-op: ascending colon polyp, rectal polyps, rectosigmoid polyp [name] [name] 1. Received in formalin is a tan, soft tissue fragment that is submitted in toto. Size: 0.8 x 0.4 x 0.2 cm, 1 block submitted. 2. The specimen is received in formalin and consists of a 0.5 cm polypoid piece of tan red soft tissue, and a strip of tan mucosa with a sessile polypoid structure measuring 0.7 cm. The specimen is entirely submitted in one cassette. 3. The specimen is received in formalin and consists of a 0.8 x 0.7 x 0.6 cm polypoid piece of tan red soft tissue. The resection margin is inked black, and the specimen is bisected and entirely submitted in one cassette. (KL:ds 08/23/16) 1 of 2 FINAL for [name] [name], [name] [name] ([id number]) Report signed out from the following location(s) Technical component and interpretation was performed at [facility] [place], [place], [place] [address]. CLIA #: [id number], 2 of   Other Problems [name] [name], [name]; 09/08/2016 11:03 AM) Back Pain  Hemorrhoids  Hepatitis  High blood pressure  Seizure Disorder   Past Surgical History [name] [name], [name]; 09/08/2016 11:03 AM) Colon Polyp Removal - Colonoscopy  Oral Surgery   Diagnostic Studies History [name] [name], [name]; 09/08/2016 11:03 AM) Colonoscopy  within  last year  Allergies [name] [name], [name]; 09/08/2016 11:04 AM) No Known Drug Allergies 09/08/2016  Medication History ([name] [name], [name]; 09/08/2016 11:06 AM) OxyCODONE HCl (5MG  Tablet, Oral) Active. AmLODIPine Besylate (5MG  Tablet, Oral) Active. Atenolol (50MG  Tablet, Oral) Active. Divalproex Sodium (500MG  Tablet DR, Oral) Active. Fluzone High-Dose (0.5ML Susp Pref Syr, Intramuscular) Active. Baby Aspirin (81MG  Tablet Chewable, Oral) Active. Vitamin D3 (1000UNIT Tablet, Oral) Active. Magnesium (250MG  Tablet, Oral) Active. Omega 3 (1000MG  Capsule, Oral) Active. Medications Reconciled  Social History [name] [name], [name]; 09/08/2016 11:03 AM) Alcohol use  Occasional alcohol use. Caffeine use  Carbonated beverages, Coffee. No drug use  Tobacco use  Current every day smoker.  Family History [name] [name], [name]; 09/08/2016 11:03 AM) Anesthetic complications  Brother. Hypertension  Father.    Review of Systems [name] [name]; 09/08/2016 11:03 AM) General Not Present- Appetite Loss, Chills, Fatigue, Fever, Night Sweats, Weight Gain and Weight Loss. Skin Not Present- Change in Wart/Mole, Dryness, Hives, Jaundice, New Lesions, Non-Healing Wounds, Rash and Ulcer. HEENT Present- Hearing Loss and Wears glasses/contact lenses. Not Present- Earache, Hoarseness, Nose Bleed, Oral Ulcers, Ringing in the Ears, Seasonal Allergies, Sinus Pain, Sore Throat, Visual Disturbances and Yellow Eyes. Respiratory Not Present- Bloody sputum, Chronic Cough, Difficulty Breathing, Snoring and Wheezing. Cardiovascular Present- Leg Cramps. Not Present- Chest Pain, Difficulty Breathing Lying Down, Palpitations, Rapid Heart Rate, Shortness of Breath and Swelling of Extremities. Gastrointestinal Present- Hemorrhoids and Rectal Pain. Not Present- Abdominal Pain, Bloating, Bloody Stool, Change in Bowel Habits, Chronic diarrhea, Constipation, Difficulty Swallowing, Excessive gas, Gets full  quickly at meals, Indigestion, Nausea and Vomiting. Male Genitourinary Present- Impotence. Not Present- Blood in Urine, Change in Urinary Stream, Frequency, Nocturia, Painful Urination, Urgency and Urine Leakage. Musculoskeletal Present- Back Pain. Not Present- Joint Pain, Joint Stiffness, Muscle Pain, Muscle Weakness and Swelling of Extremities. Neurological Present- Seizures. Not Present- Decreased Memory, Fainting, Headaches, Numbness, Tingling, Tremor, Trouble walking and Weakness. Psychiatric Not Present- Anxiety, Bipolar, Change in Sleep Pattern, Depression, Fearful and Frequent crying. Endocrine Not Present- Cold Intolerance, Excessive Hunger, Hair Changes, Heat Intolerance, Hot flashes and New Diabetes. Hematology Not Present- Blood Thinners, Easy Bruising, Excessive bleeding, Gland problems, HIV and Persistent Infections.  Vitals ([name] [name] [name]; 09/08/2016 11:04 AM) 09/08/2016 11:04 AM Weight: 156 lb Height: 70in Body Surface Area: 1.88 m Body Mass Index: 22.38 kg/m  Pulse: 76 (Regular)  BP: 138/82 (Sitting, Left Arm, Standard)       Physical Exam [name] Hector MD; 09/08/2016 11:43 AM) General Mental Status-Alert. General Appearance-Not in acute distress, Not Sickly. Orientation-Oriented X3. Hydration-Well hydrated. Voice-Normal. Note: Smell of cigarettes   Integumentary Global Assessment Upon inspection and palpation of skin surfaces of the - Axillae: non-tender, no inflammation or ulceration, no drainage. and Distribution of scalp and body hair is normal. General Characteristics Temperature - normal warmth is noted.  Head and Neck Head-normocephalic, atraumatic with no lesions or palpable masses. Face Global Assessment - atraumatic, no absence of expression. Neck Global Assessment - no abnormal movements, no bruit auscultated on the right, no bruit auscultated on the left, no decreased range of motion,  non-tender. Trachea-midline. Thyroid Gland Characteristics - non-tender.  Eye Eyeball - Left-Extraocular movements intact, No Nystagmus. Eyeball - Right-Extraocular movements intact, No Nystagmus. Cornea - Left-No Hazy. Cornea - Right-No Hazy. Sclera/Conjunctiva - Left-No scleral icterus, No Discharge. Sclera/Conjunctiva - Right-No scleral icterus, No Discharge. Pupil - Left-Direct reaction to light normal. Pupil - Right-Direct reaction to light normal. Note: Wears glasses. Vision acceptable   ENMT Ears  Pinna - Left - no drainage observed, no generalized tenderness observed. Right - no drainage observed, no generalized tenderness observed. Nose and Sinuses External Inspection of the Nose - no destructive lesion observed. Inspection of the nares - Left - quiet respiration. Right - quiet respiration. Mouth and Throat Lips - Upper Lip - no fissures observed, no pallor noted. Lower Lip - no fissures observed, no pallor noted. Nasopharynx - no discharge present. Oral Cavity/Oropharynx - Tongue - no dryness observed. Oral Mucosa - no cyanosis observed. Hypopharynx - no evidence of airway distress observed.  Chest and Lung Exam Inspection Movements - Normal and Symmetrical. Accessory muscles - No use of accessory muscles in breathing. Palpation Palpation of the chest reveals - Non-tender. Auscultation Breath sounds - Normal and Clear.  Cardiovascular Auscultation Rhythm - Regular. Murmurs & Other Heart Sounds - Auscultation of the heart reveals - No Murmurs and No Systolic Clicks.  Abdomen Inspection Inspection of the abdomen reveals - No Visible peristalsis and No Abnormal pulsations. Umbilicus - No Bleeding, No Urine drainage. Palpation/Percussion Palpation and Percussion of the abdomen reveal - Soft, Non Tender, No Rebound tenderness, No Rigidity (guarding) and No Cutaneous hyperesthesia. Note: Abdomen soft. Nontender, nondistended. No guarding. No umbilical  no other hernias   Male Genitourinary Sexual Maturity Tanner 5 - Adult hair pattern and Adult penile size and shape. Note: Old bilateral groin incisions. No inguinal hernias. Normal external genitalia. Epididymi, testes, and spermatic cords normal without any masses.   Rectal Note: Obvious complete rectal prolapse 2-3 cm out. Very sensitive. Worse on the right side. Not merely hemorrhoids but circumferential. Eventually reduced.   No discrete rectal masses. Hemorrhoidal piles not particularly enlarged. Grade 2. Actually normal sphincter tone. No fissure. No fistula. Held off on anoscopy given obvious prolapse and sensitivity and recent colonoscopy.   Peripheral Vascular Upper Extremity Inspection - Left - No Cyanotic nailbeds, Not Ischemic. Right - No Cyanotic nailbeds, Not Ischemic.  Neurologic Neurologic evaluation reveals -normal attention span and ability to concentrate, able to name objects and repeat phrases. Appropriate fund of knowledge , normal sensation and normal coordination. Mental Status Affect - not angry, not paranoid. Cranial Nerves-Normal Bilaterally. Gait-Normal.  Neuropsychiatric Mental status exam performed with findings of-able to articulate well with normal speech/language, rate, volume and coherence, thought content normal with ability to perform basic computations and apply abstract reasoning and no evidence of hallucinations, delusions, obsessions or homicidal/suicidal ideation.  Musculoskeletal Global Assessment Spine, Ribs and Pelvis - no instability, subluxation or laxity. Right Upper Extremity - no instability, subluxation or laxity.  Lymphatic Head & Neck  General Head & Neck Lymphatics: Bilateral - Description - No Localized lymphadenopathy. Axillary  General Axillary Region: Bilateral - Description - No Localized lymphadenopathy. Femoral & Inguinal  Generalized Femoral & Inguinal Lymphatics: Left - Description - No  Localized lymphadenopathy. Right - Description - No Localized lymphadenopathy.    Assessment & Plan  RECTAL PROLAPSE (K62.3) Impression: Circumferential rectal prolapse with intermittent incontinence. Not hugely long but chronically out and irritating. Not consistent merely with prolapsed hemorrhoids.  Sphincter tone function seems good. Would like to get a sense of his anatomy. See if he has much rectosigmoid redundancy. Would like to get a barium enema to better assess the anatomy.  The most definitive approach is rectosigmoid resection with suture rectopexy. Minimally Invasive robotic or laparoscopic approach. Better correct anatomy and minimize recurrence. However, if he has not much redundancy or is a poor surgical risk, may try a transanal resection first and  keep it simple. My concern of the simpler approach is that it failure rate is high, especially in a smoker.  We'll try and do more standard resection/rectopexy for better long-term result  QUIT SMOKING! Current Plans Pt Education - CCS Rectal Prolapse Info (Tramane Gorum): discussed with patient and provided information. Pt Education - CCS Pelvic Floor Exercises (Kegels) and Dysfunction HCI (Eann Cleland) Given his smoking history and fair walking tolerance, would like a cardiac clearance to get a sense of how good his cardiac function is. If it looks like he has good ejection fraction is felt to be mild cardiac risk, may proceed with more aggressive surgical repair for better long-term result. If very concerning, may hold off into more simpler repair, knowing that the failure rate is higher.  I recommended obtaining preoperative cardiac clearance. I am concerned about the health of the patient and the ability to tolerate the operation. Therefore, we will request clearance by cardiology to better assess operative risk & see if a reevaluation, further workup, etc is needed. Also recommendations on how medications such as for anticoagulation and blood  pressure should be managed/held/restarted after surgery.  You are being scheduled for surgery - Our schedulers will call you.  You should hear from our office's scheduling department within 5 working days about the location, date, and time of surgery. We try to make accommodations for patient's preferences in scheduling surgery, but sometimes the OR schedule or the surgeon's schedule prevents [place] from making those accommodations.  If you have not heard from our office [phone]) in 5 working days, call the office and ask for your surgeon's nurse.  If you have other questions about your diagnosis, plan, or surgery, call the office and ask for your surgeon's nurse.  TOBACCO ABUSE (Z72.0) Impression: I strongly recommend he quit smoking.  Try and walk more regularly to decrease claudication and decrease operative risks. Current Plans Pt Education - CCS STOP SMOKING! COLORECTAL POLYPS (K63.5) Current Plans Pt Education - Education: Pathology Report given to patient  [name] [name], M.D., F.A.C.S. Gastrointestinal and Minimally Invasive Surgery [facility] [address] [place], [place] [address] [phone] Main / Paging

## 2017-01-26 NOTE — Transfer of Care (Signed)
Immediate Anesthesia Transfer of Care Note  Patient: [name] [name]  Procedure(s) Performed: Procedure(s): XI ROBOT LOW ANTERIOR RESECTION WITH RECTOPEXY (N/A) RIGID PROCTOSCOPY (N/A) HEMORRHOIDECTOMY AND LIGATION (N/A)  Patient Location: PACU  Anesthesia Type:General  Level of Consciousness: sedated  Airway & Oxygen Therapy: Patient Spontanous Breathing and Patient connected to face mask oxygen  Post-op Assessment: Report given to RN and Post -op Vital signs reviewed and stable  Post vital signs: Reviewed and stable  Last Vitals:  Vitals:   01/26/17 0628  BP: 132/76  Pulse: 60  Resp: 18  Temp: 36.4 C    Last Pain:  Vitals:   01/26/17 0801  TempSrc:   PainSc: 3          Complications: No apparent anesthesia complications
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
## 2017-01-26 NOTE — Anesthesia Procedure Notes (Signed)
Procedure Name: Intubation Date/Time: 01/26/2017 8:41 AM Performed by: Danley Danker L Patient Re-evaluated:Patient Re-evaluated prior to inductionOxygen Delivery Method: Circle system utilized Preoxygenation: Pre-oxygenation with 100% oxygen Intubation Type: IV induction Ventilation: Mask ventilation without difficulty and Oral airway inserted - appropriate to patient size Laryngoscope Size: Miller and 3 Grade View: Grade I Tube type: Oral Tube size: 8.0 mm Number of attempts: 1 Airway Equipment and Method: Stylet Placement Confirmation: ETT inserted through vocal cords under direct vision,  positive ETCO2 and breath sounds checked- equal and bilateral Secured at: 22 cm Tube secured with: Tape Dental Injury: Teeth and Oropharynx as per pre-operative assessment

## 2017-01-27 ENCOUNTER — Encounter (HOSPITAL_COMMUNITY): Payer: Self-pay | Admitting: Surgery

## 2017-01-27 DIAGNOSIS — R569 Unspecified convulsions: Secondary | ICD-10-CM

## 2017-01-27 DIAGNOSIS — I10 Essential (primary) hypertension: Secondary | ICD-10-CM | POA: Diagnosis present

## 2017-01-27 DIAGNOSIS — B1920 Unspecified viral hepatitis C without hepatic coma: Secondary | ICD-10-CM | POA: Diagnosis present

## 2017-01-27 LAB — BASIC METABOLIC PANEL
ANION GAP: 5 (ref 5–15)
BUN: 15 mg/dL (ref 6–20)
CHLORIDE: 99 mmol/L — AB (ref 101–111)
CO2: 30 mmol/L (ref 22–32)
Calcium: 8.6 mg/dL — ABNORMAL LOW (ref 8.9–10.3)
Creatinine, Ser: 0.96 mg/dL (ref 0.61–1.24)
GFR calc non Af Amer: >60 mL/min (ref >60)
Glucose, Bld: 156 mg/dL — ABNORMAL HIGH (ref 65–99)
POTASSIUM: 4.7 mmol/L (ref 3.5–5.1)
Sodium: 134 mmol/L — ABNORMAL LOW (ref 135–145)

## 2017-01-27 LAB — CBC
HCT: 30.6 % — ABNORMAL LOW (ref 39.0–52.0)
HEMOGLOBIN: 10.8 g/dL — AB (ref 13.0–17.0)
MCH: 33.2 pg (ref 26.0–34.0)
MCHC: 35.3 g/dL (ref 30.0–36.0)
MCV: 94.2 fL (ref 78.0–100.0)
Platelets: 146 10*3/uL — ABNORMAL LOW (ref 150–400)
RBC: 3.25 MIL/uL — AB (ref 4.22–5.81)
RDW: 13.3 % (ref 11.5–15.5)
WBC: 8.1 10*3/uL (ref 4.0–10.5)

## 2017-01-27 LAB — MAGNESIUM: Magnesium: 1.7 mg/dL (ref 1.7–2.4)

## 2017-01-27 NOTE — Progress Notes (Signed)
[facility]  [address] [place], [place] [address] Phone: [phone] FAX: [phone]   [name] [name] [id number] 1951-07-02    Problem List:   Principal Problem:   Rectal prolapse s/p robotic LAR/rectopexy 01/26/2017 Active Problems:   Prolapsed internal hemorrhoids, grade 4   History of hepatitis C   Tobacco abuse   Complete rectal prolapse with displacement of anal sphincter   Hypertension   Hepatitis C   Seizures ([name] [name])   1 Day Post-Op  01/26/2017  POST-OPERATIVE DIAGNOSIS:    Rectal prolapse Grade 3 internal prolapsing hemorrhoids  OR FINDINGS:   Patient had some redundant rectosigmoid colon with about 5 cm circumferential prolapse.  No obvious metastatic disease on visceral parietal peritoneum or liver.  The anastomosis rests 12 cm from the anal verge by rigid proctoscopy.  Patient had persistent right posterior large prolapsing internal hemorrhoid.  Ligated and excised.  Left lateral internal hemorrhoid and large as well ligated.  Pexy done.  Anatomy much improved.  PROCEDURE:   XI ROBOTIC LOW ANTERIOR RESECTION RECTOPEXY RIGID PROCTOSCOPY HEMORRHOIDECTOMY INTERNAL HEMORRHOIDAL LIGATION  SURGEON:  [name] Hector, MD    Assessment  Fair but stable  Plan:  -liquids only until nausea subsides.  ADAT if has flatus -HTN control - norvasc -wean oxygen -seizure proph. - depakote -VTE prophylaxis- SCDs, etc -mobilize as tolerated to help recovery -Explain the operative findings.  I strongly recommend that he help have support at home, especially first week or so, to make sure that he recovers safely.  After much and explaining, he will consider having his good friend help him out.  I updated the patient's status to the patient.  Recommendations were made.  Questions were answered.  He expressed understanding & appreciation.   [name] [name], M.D., F.A.C.S. Gastrointestinal and Minimally  Invasive Surgery [facility] [address] [place], [place] [address] [phone] Main / Paging   01/27/2017  CARE TEAM:  PCP: [name] Bogus, MD  Outpatient Care Team: Patient Care Team: [name] Du, MD as PCP - General (Internal Medicine) [name] Dolin, MD as Consulting Physician (Gastroenterology) [name] Boston, MD as Consulting Physician (General Surgery) [name] Hector, MD as Consulting Physician (Cardiology)  Inpatient Treatment Team: Treatment Team: Attending Provider: Michael Boston, MD; Technician: [name] [name], NT  Subjective:  Had nausea and vomiting 1.  Not nauseated now.  Denies pain.  Minimal flatus.  He lives by himself.  He is open to drive home by himself and not bother anyone.  Objective:  Vital signs:  Vitals:   01/27/17 0200 01/27/17 0452 01/27/17 0614 01/27/17 0835  BP: (!) 148/70 (!) 149/77  (!) 157/74  Pulse: 64 70  62  Resp: 18 18  14   Temp: 98.2 F (36.8 C) 98 F (36.7 C)  98.4 F (36.9 C)  TempSrc: Oral Oral  Oral  SpO2: 98% 99%  98%  Weight:   76.7 kg (169 lb 1.6 oz)   Height:        Last BM Date: 01/25/17  Intake/Output   Yesterday:  03/28 0701 - 03/29 0700 In: 3748 [I.V.:3125; IV Piggyback:50] Out: 2707 [Urine:675; Drains:770; Blood:100] This shift:  Total I/O In: 1160 [Other:1160] Out: 1100 [Urine:1100]  Bowel function:  Flatus: YES  BM:  No  Drain: Serosanguinous   Physical Exam:  General: Pt awake/alert/oriented x4 inno acute distress Eyes: PERRL, normal EOM.  Sclera clear.  No icterus Neuro: CN II-XII intact w/o focal  sensory/motor deficits. Lymph: No head/neck/groin lymphadenopathy Psych:  No delerium/psychosis/paranoia HENT: Normocephalic, Mucus membranes moist.  No thrush Neck: Supple, No tracheal deviation Chest: No chest wall pain w good excursion CV:  Pulses intact.  Regular rhythm MS: Normal AROM mjr joints.  No obvious deformity Abdomen: Soft.  Mildy  distended.  Nontender.  No evidence of peritonitis.  No incarcerated hernias. Ext:  SCDs BLE.  No mjr edema.  No cyanosis Skin: No petechiae / purpura  Results:   Labs: Results for orders placed or performed during the hospital encounter of 01/26/17 (from the past 48 hour(s))  Basic metabolic panel     Status: Abnormal   Collection Time: 01/27/17  4:50 AM  Result Value Ref Range   Sodium 134 (L) 135 - 145 mmol/L   Potassium 4.7 3.5 - 5.1 mmol/L   Chloride 99 (L) 101 - 111 mmol/L   CO2 30 22 - 32 mmol/L   Glucose, Bld 156 (H) 65 - 99 mg/dL   BUN 15 6 - 20 mg/dL   Creatinine, Ser 0.96 0.61 - 1.24 mg/dL   Calcium 8.6 (L) 8.9 - 10.3 mg/dL   GFR calc non Af Amer >60 >60 mL/min   GFR calc Af Amer >60 >60 mL/min    Comment: (NOTE) The eGFR has been calculated using the CKD EPI equation. This calculation has not been validated in all clinical situations. eGFR's persistently <60 mL/min signify possible Chronic Kidney Disease.    Anion gap 5 5 - 15  CBC     Status: Abnormal   Collection Time: 01/27/17  4:50 AM  Result Value Ref Range   WBC 8.1 4.0 - 10.5 K/uL   RBC 3.25 (L) 4.22 - 5.81 MIL/uL   Hemoglobin 10.8 (L) 13.0 - 17.0 g/dL   HCT 30.6 (L) 39.0 - 52.0 %   MCV 94.2 78.0 - 100.0 fL   MCH 33.2 26.0 - 34.0 pg   MCHC 35.3 30.0 - 36.0 g/dL   RDW 13.3 11.5 - 15.5 %   Platelets 146 (L) 150 - 400 K/uL  Magnesium     Status: None   Collection Time: 01/27/17  4:50 AM  Result Value Ref Range   Magnesium 1.7 1.7 - 2.4 mg/dL    Imaging / Studies: No results found.  Medications / Allergies: per chart  Antibiotics: Anti-infectives    Start     Dose/Rate Route Frequency Ordered Stop   01/26/17 2100  cefoTEtan (CEFOTAN) 2 g in dextrose 5 % 50 mL IVPB     2 g 100 mL/hr over 30 Minutes Intravenous Every 12 hours 01/26/17 1430 01/26/17 2047   01/26/17 0958  clindamycin (CLEOCIN) injection  Status:  Discontinued       As needed 01/26/17 0959 01/26/17 1236   01/26/17 0750  cefoTEtan  in Dextrose 5% (CEFOTAN) 2-2.08 GM-% IVPB    Comments:  [name] [name]   : cabinet override      01/26/17 0750 01/26/17 1959   01/26/17 0730  cefoTEtan in Dextrose 5% (CEFOTAN) IVPB 2 g     2 g Intravenous On call to O.R. 01/26/17 0725 01/26/17 0845   01/26/17 0629  cefoTEtan (CEFOTAN) 2 g in dextrose 5 % 50 mL IVPB  Status:  Discontinued     2 g 100 mL/hr over 30 Minutes Intravenous On call to O.R. 01/26/17 9242 01/26/17 0725   01/26/17 0600  clindamycin (CLEOCIN) 900 mg, gentamicin (GARAMYCIN) 240 mg in sodium chloride 0.9 % 1,000 mL for intraperitoneal lavage  Status:  Discontinued      Intraperitoneal To Surgery 01/25/17 1354 01/26/17 1414        Note: Portions of this report may have been transcribed using voice recognition software. Every effort was made to ensure accuracy; however, inadvertent computerized transcription errors may be present.   Any transcriptional errors that result from this process are unintentional.     [name] [name], M.D., F.A.C.S. Gastrointestinal and Minimally Invasive Surgery [facility] [name] [place], [place] [address] [phone] Main / Paging   01/27/2017

## 2017-01-28 ENCOUNTER — Inpatient Hospital Stay (HOSPITAL_COMMUNITY): Payer: Medicare Other

## 2017-01-28 LAB — BLOOD GAS, ARTERIAL
Acid-Base Excess: 4.9 mmol/L — ABNORMAL HIGH (ref 0.0–2.0)
BICARBONATE: 27.9 mmol/L (ref 20.0–28.0)
Drawn by: 308601
FIO2: 100
O2 SAT: 82.4 %
PATIENT TEMPERATURE: 98.6
PO2 ART: 46.8 mmHg — AB (ref 83.0–108.0)
pCO2 arterial: 36.3 mmHg (ref 32.0–48.0)
pH, Arterial: 7.497 — ABNORMAL HIGH (ref 7.350–7.450)

## 2017-01-28 MED ORDER — IOPAMIDOL (ISOVUE-300) INJECTION 61%
75.0000 mL | Freq: Once | INTRAVENOUS | Status: AC | PRN
  Administered 2017-01-28: 75 mL via INTRAVENOUS

## 2017-01-28 MED ORDER — IOPAMIDOL (ISOVUE-300) INJECTION 61%
INTRAVENOUS | Status: AC
  Filled 2017-01-28: qty 75

## 2017-01-28 MED ORDER — ALBUTEROL SULFATE (2.5 MG/3ML) 0.083% IN NEBU
2.5000 mg | INHALATION_SOLUTION | RESPIRATORY_TRACT | Status: DC | PRN
  Administered 2017-01-28: 2.5 mg via RESPIRATORY_TRACT
  Filled 2017-01-28: qty 3

## 2017-01-28 NOTE — Addendum Note (Signed)
Addendum  created 01/28/17 0757 by Lynda Rainwater, MD   Sign clinical note

## 2017-01-28 NOTE — Progress Notes (Signed)
[facility]  [address] [place], [place] [address] Phone: [phone] FAX: [phone]   [name] [name] [id number] Dec 21, 1950    Problem List:   Principal Problem:   Rectal prolapse s/p robotic LAR/rectopexy 01/26/2017 Active Problems:   Prolapsed internal hemorrhoids, grade 4   History of hepatitis C   Tobacco abuse   Complete rectal prolapse with displacement of anal sphincter   Hypertension   Hepatitis C   Seizures ([place])   2 Days Post-Op  01/26/2017  POST-OPERATIVE DIAGNOSIS:    Rectal prolapse Grade 3 internal prolapsing hemorrhoids  OR FINDINGS:   Patient had some redundant rectosigmoid colon with about 5 cm circumferential prolapse.  No obvious metastatic disease on visceral parietal peritoneum or liver.  The anastomosis rests 12 cm from the anal verge by rigid proctoscopy.  Patient had persistent right posterior large prolapsing internal hemorrhoid.  Ligated and excised.  Left lateral internal hemorrhoid and large as well ligated.  Pexy done.  Anatomy much improved.  PROCEDURE:   XI ROBOTIC LOW ANTERIOR RESECTION RECTOPEXY RIGID PROCTOSCOPY HEMORRHOIDECTOMY INTERNAL HEMORRHOIDAL LIGATION  SURGEON:  Adin Hector, MD    Assessment  Regaining bowel function but having incontinence  Plan:  - advance diet -HTN control - norvasc -wean oxygen -seizure proph. - depakote -VTE prophylaxis- SCDs, etc -mobilize as tolerated to help recovery   Rosario Adie, MD  [facility]    01/28/2017  CARE TEAM:  PCP: Alonza Bogus, MD  Outpatient Care Team: Patient Care Team: Sinda Du, MD as PCP - General (Internal Medicine) Daneil Dolin, MD as Consulting Physician (Gastroenterology) Michael Boston, MD as Consulting Physician (General Surgery) Josue Hector, MD as Consulting Physician (Cardiology)  Inpatient Treatment Team: Treatment Team:  Attending Provider: Michael Boston, MD; Technician: Macy Junita Push, NT  Subjective:  Had nausea and vomiting 1.  Not nauseated now.  Denies pain.  Minimal flatus.  He lives by himself.  He is open to drive home by himself and not bother anyone.  Objective:  Vital signs:  Vitals:   01/27/17 1414 01/27/17 1830 01/27/17 2112 01/28/17 0556  BP: (!) 152/65 (!) 146/82 (!) 160/60 (!) 142/71  Pulse: 66 68 67 61  Resp: 13  16 16   Temp: 98.8 F (37.1 C)  98.9 F (37.2 C) 99.7 F (37.6 C)  TempSrc: Oral  Oral Oral  SpO2: 97%  97% 94%  Weight:    76.6 kg (168 lb 14 oz)  Height:        Last BM Date: 01/25/17  Intake/Output   Yesterday:  03/29 0701 - 03/30 0700 In: 1880 [P.O.:720] Out: 4710 [Urine:4450; Drains:260] This shift:  No intake/output data recorded.  Bowel function:  Flatus: YES  BM:  No  Drain: Serosanguinous   Physical Exam:  Gen: NAD Abd: soft JP: bloody fluid   Results:   Labs: Results for orders placed or performed during the hospital encounter of 01/26/17 (from the past 48 hour(s))  Basic metabolic panel     Status: Abnormal   Collection Time: 01/27/17  4:50 AM  Result Value Ref Range   Sodium 134 (L) 135 - 145 mmol/L   Potassium 4.7 3.5 - 5.1 mmol/L   Chloride 99 (L) 101 - 111 mmol/L   CO2 30 22 - 32 mmol/L   Glucose, Bld 156 (H) 65 - 99 mg/dL   BUN 15 6 - 20 mg/dL   Creatinine, Ser 0.96 0.61 - 1.24  mg/dL   Calcium 8.6 (L) 8.9 - 10.3 mg/dL   GFR calc non Af Amer >60 >60 mL/min   GFR calc Af Amer >60 >60 mL/min    Comment: (NOTE) The eGFR has been calculated using the CKD EPI equation. This calculation has not been validated in all clinical situations. eGFR's persistently <60 mL/min signify possible Chronic Kidney Disease.    Anion gap 5 5 - 15  CBC     Status: Abnormal   Collection Time: 01/27/17  4:50 AM  Result Value Ref Range   WBC 8.1 4.0 - 10.5 K/uL   RBC 3.25 (L) 4.22 - 5.81 MIL/uL   Hemoglobin 10.8 (L) 13.0 - 17.0 g/dL    HCT 30.6 (L) 39.0 - 52.0 %   MCV 94.2 78.0 - 100.0 fL   MCH 33.2 26.0 - 34.0 pg   MCHC 35.3 30.0 - 36.0 g/dL   RDW 13.3 11.5 - 15.5 %   Platelets 146 (L) 150 - 400 K/uL  Magnesium     Status: None   Collection Time: 01/27/17  4:50 AM  Result Value Ref Range   Magnesium 1.7 1.7 - 2.4 mg/dL    Imaging / Studies: No results found.  Medications / Allergies: per chart  Antibiotics: Anti-infectives    Start     Dose/Rate Route Frequency Ordered Stop   01/26/17 2100  cefoTEtan (CEFOTAN) 2 g in dextrose 5 % 50 mL IVPB     2 g 100 mL/hr over 30 Minutes Intravenous Every 12 hours 01/26/17 1430 01/26/17 2047   01/26/17 0958  clindamycin (CLEOCIN) injection  Status:  Discontinued       As needed 01/26/17 0959 01/26/17 1236   01/26/17 0750  cefoTEtan in Dextrose 5% (CEFOTAN) 2-2.08 GM-% IVPB    Comments:  Danley Danker   : cabinet override      01/26/17 0750 01/26/17 1959   01/26/17 0730  cefoTEtan in Dextrose 5% (CEFOTAN) IVPB 2 g     2 g Intravenous On call to O.R. 01/26/17 0725 01/26/17 0845   01/26/17 0629  cefoTEtan (CEFOTAN) 2 g in dextrose 5 % 50 mL IVPB  Status:  Discontinued     2 g 100 mL/hr over 30 Minutes Intravenous On call to O.R. 01/26/17 8850 01/26/17 0725   01/26/17 0600  clindamycin (CLEOCIN) 900 mg, gentamicin (GARAMYCIN) 240 mg in sodium chloride 0.9 % 1,000 mL for intraperitoneal lavage  Status:  Discontinued      Intraperitoneal To Surgery 01/25/17 1354 01/26/17 1414          01/28/2017

## 2017-01-28 NOTE — Progress Notes (Signed)
Pts 02 saturation was 82 % on 3.5L O2. MD notified. Order for albuterol nebulizer given. Pts O2 was turned up to 6L and pt was still only 82%. Respiratory placed patient on non-rebreather after giving nebulizer and pt was only at 91%. MD notified . Orders for abg and chest xray given. MD was notified of results and ordered a CT scan with contrast.  [name] [name]

## 2017-01-29 DIAGNOSIS — K623 Rectal prolapse: Principal | ICD-10-CM

## 2017-01-29 LAB — MRSA PCR SCREENING: MRSA by PCR: NEGATIVE

## 2017-01-29 LAB — EXPECTORATED SPUTUM ASSESSMENT W REFEX TO RESP CULTURE

## 2017-01-29 LAB — EXPECTORATED SPUTUM ASSESSMENT W GRAM STAIN, RFLX TO RESP C

## 2017-01-29 MED ORDER — IPRATROPIUM-ALBUTEROL 0.5-2.5 (3) MG/3ML IN SOLN
3.0000 mL | Freq: Four times a day (QID) | RESPIRATORY_TRACT | Status: DC
  Administered 2017-01-29 – 2017-01-30 (×7): 3 mL via RESPIRATORY_TRACT
  Filled 2017-01-29 (×7): qty 3

## 2017-01-29 MED ORDER — METHYLPREDNISOLONE SODIUM SUCC 40 MG IJ SOLR
40.0000 mg | Freq: Two times a day (BID) | INTRAMUSCULAR | Status: DC
  Administered 2017-01-29 – 2017-01-30 (×3): 40 mg via INTRAVENOUS
  Filled 2017-01-29 (×2): qty 1

## 2017-01-29 MED ORDER — ACETYLCYSTEINE 20 % IN SOLN
4.0000 mL | Freq: Two times a day (BID) | RESPIRATORY_TRACT | Status: AC
  Administered 2017-01-29 – 2017-01-30 (×3): 4 mL via RESPIRATORY_TRACT
  Filled 2017-01-29 (×3): qty 4

## 2017-01-29 NOTE — Progress Notes (Signed)
Patient ID: [name] [name], male   DOB: 23-Feb-1951, 66 y.o.   MRN: [id number] [facility] Progress Note:   3 Days Post-Op  Subjective: Mental status is fairly clear.   Objective: Vital signs in last 24 hours: Temp:  [97.7 F (36.5 C)-98.3 F (36.8 C)] 97.7 F (36.5 C) (03/31 0750) Pulse Rate:  [60-71] 71 (03/31 0600) Resp:  [16-20] 20 (03/31 0600) BP: (140-171)/(64-87) 171/75 (03/31 0600) SpO2:  [82 %-100 %] 100 % (03/31 0600) Weight:  [68.1 kg (150 lb 2.1 oz)] 68.1 kg (150 lb 2.1 oz) (03/31 0430)  Intake/Output from previous day: 03/30 0701 - 03/31 0700 In: 480 [P.O.:480] Out: 220 [Urine:150; Drains:70] Intake/Output this shift: No intake/output data recorded.  Physical Exam: Work of breathing is increased.  BS present bilaterally;  JP drainage serosanguinous;    Lab Results:  Results for orders placed or performed during the hospital encounter of 01/26/17 (from the past 48 hour(s))  Blood gas, arterial     Status: Abnormal   Collection Time: 01/28/17  9:58 PM  Result Value Ref Range   FIO2 100.00    Delivery systems NON-REBREATHER OXYGEN MASK    pH, Arterial 7.497 (H) 7.350 - 7.450   pCO2 arterial 36.3 32.0 - 48.0 mmHg   pO2, Arterial 46.8 (L) 83.0 - 108.0 mmHg   Bicarbonate 27.9 20.0 - 28.0 mmol/L   Acid-Base Excess 4.9 (H) 0.0 - 2.0 mmol/L   O2 Saturation 82.4 %   Patient temperature 98.6    Collection site RIGHT RADIAL    Drawn by [id number]    Sample type ARTERIAL DRAW    Allens test (pass/fail) PASS PASS  MRSA PCR Screening     Status: None   Collection Time: 01/29/17  4:28 AM  Result Value Ref Range   MRSA by PCR NEGATIVE NEGATIVE    Comment:        The GeneXpert MRSA Assay (FDA approved for NASAL specimens only), is one component of a comprehensive MRSA colonization surveillance program. It is not intended to diagnose MRSA infection nor to guide or monitor treatment for MRSA infections.     Radiology/Results: Dg Chest 1 View  Result  Date: 01/28/2017 CLINICAL DATA:  Acute onset of decreased O2 saturation. Initial encounter. EXAM: CHEST 1 VIEW COMPARISON:  Chest radiograph performed 05/01/2013 FINDINGS: Left basilar airspace opacification raises concern for pneumonia. A small left pleural effusion is suspected. The right lung appears clear. No pneumothorax is seen. The heart size silhouette is normal in size. No acute osseous abnormalities are identified. The patient is status post left-sided rotator cuff repair. IMPRESSION: Left basilar airspace opacification raises concern for pneumonia. Suspect small left pleural effusion. Electronically Signed   By: Garald Balding M.D.   On: 01/28/2017 22:43   Ct Chest W Contrast  Result Date: 01/28/2017 CLINICAL DATA:  Decreased oxygen saturation. Abnormal chest x-ray with left basilar opacity. EXAM: CT CHEST WITH CONTRAST TECHNIQUE: Multidetector CT imaging of the chest was performed during intravenous contrast administration. CONTRAST:  69mL ISOVUE-300 IOPAMIDOL (ISOVUE-300) INJECTION 61% COMPARISON:  Radiograph earlier this day. FINDINGS: Mild to moderate breathing motion artifact. Cardiovascular: The heart is normal in size. There is atherosclerosis of the thoracic aorta and arch vessels, mild aortic tortuosity. No central pulmonary embolus. Coronary artery calcifications are seen. Mediastinum/Nodes: Small lower paratracheal nodes not enlarged by size criteria. No hilar adenopathy. Leftward mediastinal shift secondary to left lung volume loss. Intraluminal contents within the upper esophagus which is patulous, more distal esophagus is decompressed.  Lungs/Pleura: Moderate to advanced emphysema, most prominent at the apices. There is volume loss in the left hemithorax, particularly of the left lower lobe. Debris within the right and left mainstem bronchi tracking distally. There is bronchial thickening, left greater than right, greatest in the paramediastinal regions centrally. On the left this leads  to bronchial occlusion distally in the left lower lobe with heterogeneous left lower lobe consolidation. No definite obstructing lesion is seen. No pleural fluid. Upper Abdomen: No acute abnormality. Atherosclerosis of upper abdominal vasculature. Musculoskeletal: Moderate to severe compression fracture of T8 with 75% loss of height centrally. This appears chronic. No definite acute osseous abnormality allowing for breathing motion. IMPRESSION: 1. Paramediastinal bronchial thickening, left greater than right with intraluminal debris in the dependent lower lobe bronchi are, also left greater than right. Volume loss in the left lung, prominent in the lower lobe with bronchial occlusion and consolidation. Overall findings suggest aspiration, likely chronic. There is no evidence of central obstructing lesion on the left, however bronchoscopic evaluation could be considered. 2. Moderate to advanced emphysema. 3. Aortic atherosclerosis and coronary artery calcifications. Electronically Signed   By: Jeb Levering M.D.   On: 01/28/2017 23:29    Anti-infectives: Anti-infectives    Start     Dose/Rate Route Frequency Ordered Stop   01/26/17 2100  cefoTEtan (CEFOTAN) 2 g in dextrose 5 % 50 mL IVPB     2 g 100 mL/hr over 30 Minutes Intravenous Every 12 hours 01/26/17 1430 01/26/17 2047   01/26/17 0958  clindamycin (CLEOCIN) injection  Status:  Discontinued       As needed 01/26/17 0959 01/26/17 1236   01/26/17 0750  cefoTEtan in Dextrose 5% (CEFOTAN) 2-2.08 GM-% IVPB    Comments:  Danley Danker   : cabinet override      01/26/17 0750 01/26/17 1959   01/26/17 0730  cefoTEtan in Dextrose 5% (CEFOTAN) IVPB 2 g     2 g Intravenous On call to O.R. 01/26/17 0725 01/26/17 0845   01/26/17 0629  cefoTEtan (CEFOTAN) 2 g in dextrose 5 % 50 mL IVPB  Status:  Discontinued     2 g 100 mL/hr over 30 Minutes Intravenous On call to O.R. 01/26/17 3785 01/26/17 0725   01/26/17 0600  clindamycin (CLEOCIN) 900 mg, gentamicin  (GARAMYCIN) 240 mg in sodium chloride 0.9 % 1,000 mL for intraperitoneal lavage  Status:  Discontinued      Intraperitoneal To Surgery 01/25/17 1354 01/26/17 1414      Assessment/Plan: Problem List: Patient Active Problem List   Diagnosis Date Noted  . Hypertension   . Hepatitis C   . Seizures ([place])   . Complete rectal prolapse with displacement of anal sphincter 01/26/2017  . Rectal prolapse s/p robotic LAR/rectopexy 01/26/2017 09/08/2016  . Tobacco abuse 09/08/2016  . Rectal bleeding 08/12/2016  . History of hepatitis C 08/12/2016  . Prolapsed internal hemorrhoids, grade 4 07/08/2016    Transferred to ICU last night with respiratory distress.  Left lower lobe pneumonia on the chest xray;  Pulmonary toilet not adequate;  No PE on CT;  Have ask CCM to see and assist.   3 Days Post-Op    LOS: 3 days   Matt B. Hassell Done, MD, [facility] [phone] beeper [phone]  01/29/2017 7:50 AM

## 2017-01-29 NOTE — Progress Notes (Signed)
MD was notified of CT scan results. MD felt pt was appropriate to stay on [place] and continue to monitor pt throughout the night.

## 2017-01-29 NOTE — Consult Note (Signed)
Name: [name] [name] MRN: [id number] DOB: 07-12-1951    ADMISSION DATE:  01/26/2017 CONSULTATION DATE:  01/26/17   REFERRING MD :  Dr Hassell Done, MD  CHIEF COMPLAINT:  PNA, SOB  BRIEF PATIENT DESCRIPTION: 66 yr old copd, s/p sigmoid resection for rectal prolapse, now with concerns PNA  SIGNIFICANT EVENTS  3/29- sigmoid resection 3/31- SOB, asp  concerns  STUDIES:  CT chest 3/30 - Paramediastinal bronchial thickening, left greater than right with intraluminal debris in the dependent lower lobe bronchi are, also left greater than right. Volume loss in the left lung, prominent in the lower lobe with bronchial occlusion and consolidation. Overall findings suggest aspiration, likely chronic. There is no evidence of central obstructing lesion on the left, however bronchoscopic evaluation could be considered. 2. Moderate to advanced emphysema.   HISTORY OF PRESENT ILLNESS: 66 yr old admitted elective sigmoid resection for rectal prolapse and hemm. In evening, on floor, O2 needs increased to 100% nrb. Also had increase WOB. No fevers. CT chest done - with advanced lung dz and LLL mucous obstruction and atx. Not producing a lot of secretions. WOB wnl overall. No chest pain.  mild dry cough, no hemoptysis, sputum production low to none, mild shortness of breath,  No wheezing or stridor No vomiting event described  PAST MEDICAL HISTORY :   has a past medical history of Back pain; Head injuries; Hepatitis C; Hypertension; and Seizures ([place]).  has a past surgical history that includes Skull fracture elevation; Mandible fracture surgery; Nose surgery; Shoulder arthroscopy; Inguinal hernia repair (05/26/2011); Brain surgery; Hernia repair (Bilateral); Colonoscopy with propofol (N/A, 08/23/2016); polypectomy (08/23/2016); Proctoscopy (N/A, 01/26/2017); and Hemorrhoid surgery (N/A, 01/26/2017). Prior to Admission medications   Medication Sig Start Date End Date Taking? Authorizing Provider  amLODipine  (NORVASC) 5 MG tablet Take 5 mg by mouth every evening.    Yes Historical Provider, MD  aspirin EC 81 MG tablet Take 81 mg by mouth daily.   Yes Historical Provider, MD  atenolol (TENORMIN) 50 MG tablet Take 50 mg by mouth every evening.    Yes Historical Provider, MD  Cholecalciferol (VITAMIN D3) 1000 units CAPS Take 1,000 Units by mouth daily.    Yes Historical Provider, MD  divalproex (DEPAKOTE) 500 MG EC tablet Take 500 mg by mouth 2 (two) times daily.     Yes Historical Provider, MD  Magnesium 250 MG TABS Take 250 mg by mouth daily.    Yes Historical Provider, MD  Omega-3 Krill Oil 1000 MG CAPS Take 1 capsule by mouth 2 (two) times daily.   Yes Historical Provider, MD  oxyCODONE (OXY IR/ROXICODONE) 5 MG immediate release tablet Take 0.5-2 tablets (2.5-10 mg total) by mouth every 6 (six) hours as needed for severe pain or breakthrough pain. 01/26/17   Michael Boston, MD   No Known Allergies  FAMILY HISTORY:  family history is not on file. SOCIAL HISTORY:  reports that he has been smoking Cigarettes.  He has a 20.00 pack-year smoking history. He has never used smokeless tobacco. He reports that he drinks about 1.8 oz of alcohol per week . He reports that he does not use drugs.  REVIEW OF SYSTEMS:   Constitutional: Negative for fever, chills, weight loss, malaise/fatigue and diaphoresis.  HENT: POS hearing loss chronic ,  No ear pain, nosebleeds, congestion, sore throat, neck pain, tinnitus and ear discharge.   Eyes: Negative for blurred vision, double vision, photophobia, pain, discharge and redness.  Respiratory: mild dry cough, no hemoptysis, sputum production,  shortness of breath, wheezing and stridor.   Cardiovascular: Negative for chest pain, palpitations, orthopnea, claudication, leg swelling and PND.  Gastrointestinal: Negative for heartburn, nausea, vomiting, abdominal pain, diarrhea, constipation, blood in stool and melena.  Genitourinary: Negative for dysuria, urgency, frequency,  hematuria and flank pain.  Musculoskeletal: Negative for myalgias, back pain, joint pain and falls.  Skin: Negative for itching and rash.  Neurological: Negative for dizziness, tingling, tremors, sensory change, speech change, focal weakness, seizures, loss of consciousness, weakness and headaches.  Endo/Heme/Allergies: Negative for environmental allergies and polydipsia. Does not bruise/bleed easily.  SUBJECTIVE: "cant hear ya" on 100% nrb  VITAL SIGNS: Temp:  [97.7 F (36.5 C)-98.3 F (36.8 C)] 97.7 F (36.5 C) (03/31 0750) Pulse Rate:  [60-71] 71 (03/31 0600) Resp:  [16-20] 20 (03/31 0600) BP: (140-171)/(64-87) 171/75 (03/31 0600) SpO2:  [82 %-100 %] 100 % (03/31 0600) Weight:  [68.1 kg (150 lb 2.1 oz)] 68.1 kg (150 lb 2.1 oz) (03/31 0430)  PHYSICAL EXAMINATION: General:  No distress Neuro:  Awake, alert, calm, nonfocal HEENT:  jvd wnl Cardiovascular:  s1 s2 RRR distant Lungs:  Wheezing bilateral midl -mod expiration, some ronchi bilateral bases Abdomen:  Soft, wound clean, no r Musculoskeletal:  No edema Skin:  No rash   Recent Labs Lab 01/24/17 1048 01/27/17 0450  NA 137 134*  K 5.0 4.7  CL 103 99*  CO2 29 30  BUN 12 15  CREATININE 0.88 0.96  GLUCOSE 105* 156*    Recent Labs Lab 01/24/17 1048 01/27/17 0450  HGB 15.5 10.8*  HCT 44.1 30.6*  WBC 5.7 8.1  PLT 173 146*   Dg Chest 1 View  Result Date: 01/28/2017 CLINICAL DATA:  Acute onset of decreased O2 saturation. Initial encounter. EXAM: CHEST 1 VIEW COMPARISON:  Chest radiograph performed 05/01/2013 FINDINGS: Left basilar airspace opacification raises concern for pneumonia. A small left pleural effusion is suspected. The right lung appears clear. No pneumothorax is seen. The heart size silhouette is normal in size. No acute osseous abnormalities are identified. The patient is status post left-sided rotator cuff repair. IMPRESSION: Left basilar airspace opacification raises concern for pneumonia. Suspect small  left pleural effusion. Electronically Signed   By: Garald Balding M.D.   On: 01/28/2017 22:43   Ct Chest W Contrast  Result Date: 01/28/2017 CLINICAL DATA:  Decreased oxygen saturation. Abnormal chest x-ray with left basilar opacity. EXAM: CT CHEST WITH CONTRAST TECHNIQUE: Multidetector CT imaging of the chest was performed during intravenous contrast administration. CONTRAST:  37mL ISOVUE-300 IOPAMIDOL (ISOVUE-300) INJECTION 61% COMPARISON:  Radiograph earlier this day. FINDINGS: Mild to moderate breathing motion artifact. Cardiovascular: The heart is normal in size. There is atherosclerosis of the thoracic aorta and arch vessels, mild aortic tortuosity. No central pulmonary embolus. Coronary artery calcifications are seen. Mediastinum/Nodes: Small lower paratracheal nodes not enlarged by size criteria. No hilar adenopathy. Leftward mediastinal shift secondary to left lung volume loss. Intraluminal contents within the upper esophagus which is patulous, more distal esophagus is decompressed. Lungs/Pleura: Moderate to advanced emphysema, most prominent at the apices. There is volume loss in the left hemithorax, particularly of the left lower lobe. Debris within the right and left mainstem bronchi tracking distally. There is bronchial thickening, left greater than right, greatest in the paramediastinal regions centrally. On the left this leads to bronchial occlusion distally in the left lower lobe with heterogeneous left lower lobe consolidation. No definite obstructing lesion is seen. No pleural fluid. Upper Abdomen: No acute abnormality. Atherosclerosis of upper abdominal  vasculature. Musculoskeletal: Moderate to severe compression fracture of T8 with 75% loss of height centrally. This appears chronic. No definite acute osseous abnormality allowing for breathing motion. IMPRESSION: 1. Paramediastinal bronchial thickening, left greater than right with intraluminal debris in the dependent lower lobe bronchi are,  also left greater than right. Volume loss in the left lung, prominent in the lower lobe with bronchial occlusion and consolidation. Overall findings suggest aspiration, likely chronic. There is no evidence of central obstructing lesion on the left, however bronchoscopic evaluation could be considered. 2. Moderate to advanced emphysema. 3. Aortic atherosclerosis and coronary artery calcifications. Electronically Signed   By: Jeb Levering M.D.   On: 01/28/2017 23:29    ASSESSMENT / PLAN:  LLL ATX, mucous plugging, He may have aspirated ( does now admit to vomit x 1) Weak, poor cough mecahnics At risk PNA Advanced COPD with exacerbation s/p sigmoid resection Hypoxia slight out of proportion to CT findings  -add chest pt -add mucomysts bid nebs x 4, obsrve for worsening bronchospasm -add NTS tid x 3 -add scheduled BDers -upright position -low threshold abx, would add unasyn if spike -obtain sputum if able -add solumedrol low dose not to inhibit surgical wound healing -pcxr in am  -may need better BP control -if he remains on 100%, would investigate for PE further, as O2 needs may be out of proportion to CT findings -goal sats 90%  Lavon Paganini. Titus Mould, MD, [place] Pgr: [facility]  Pulmonary and [place] Pager: [phone]  01/29/2017, 8:10 AM

## 2017-01-30 ENCOUNTER — Inpatient Hospital Stay (HOSPITAL_COMMUNITY): Payer: Medicare Other

## 2017-01-30 LAB — CULTURE, RESPIRATORY: CULTURE: NORMAL

## 2017-01-30 LAB — CULTURE, RESPIRATORY W GRAM STAIN

## 2017-01-30 MED ORDER — IPRATROPIUM-ALBUTEROL 0.5-2.5 (3) MG/3ML IN SOLN
3.0000 mL | Freq: Three times a day (TID) | RESPIRATORY_TRACT | Status: DC
  Administered 2017-01-31: 3 mL via RESPIRATORY_TRACT
  Filled 2017-01-30: qty 3

## 2017-01-30 MED ORDER — METHYLPREDNISOLONE SODIUM SUCC 40 MG IJ SOLR
20.0000 mg | Freq: Two times a day (BID) | INTRAMUSCULAR | Status: DC
  Administered 2017-01-30 – 2017-01-31 (×2): 20 mg via INTRAVENOUS
  Filled 2017-01-30 (×3): qty 1

## 2017-01-30 MED ORDER — HYDRALAZINE HCL 25 MG PO TABS
25.0000 mg | ORAL_TABLET | Freq: Three times a day (TID) | ORAL | Status: DC
  Administered 2017-01-30 – 2017-01-31 (×4): 25 mg via ORAL
  Filled 2017-01-30 (×4): qty 1

## 2017-01-30 NOTE — Progress Notes (Signed)
CPT held due to Pt sleeping.  RT to resume CPT at 0800, RT to monitor and assess as needed.

## 2017-01-30 NOTE — Consult Note (Signed)
Name: [name] [name] MRN: [id number] DOB: October 05, 1951    ADMISSION DATE:  01/26/2017 CONSULTATION DATE:  01/26/17   REFERRING MD :  Dr Hassell Done, MD  CHIEF COMPLAINT:  PNA, SOB  BRIEF PATIENT DESCRIPTION: 66 yr old copd, s/p sigmoid resection for rectal prolapse, now with concerns PNA  SIGNIFICANT EVENTS  3/29- sigmoid resection 3/31- SOB, asp  Concerns 4/1- off O2 , improved  STUDIES:  CT chest 3/30 - Paramediastinal bronchial thickening, left greater than right with intraluminal debris in the dependent lower lobe bronchi are, also left greater than right. Volume loss in the left lung, prominent in the lower lobe with bronchial occlusion and consolidation. Overall findings suggest aspiration, likely chronic. There is no evidence of central obstructing lesion on the left, however bronchoscopic evaluation could be considered. 2. Moderate to advanced emphysema.  SUBJECTIVE: " I want to go home, I cant sleep" , improved sob, O2 now to RA  VITAL SIGNS: Temp:  [97.8 F (36.6 C)-98.2 F (36.8 C)] 97.8 F (36.6 C) (04/01 0000) Pulse Rate:  [61-79] 66 (04/01 0600) Resp:  [13-23] 14 (04/01 0600) BP: (116-182)/(57-97) 143/97 (04/01 0600) SpO2:  [89 %-97 %] 90 % (04/01 0600) FiO2 (%):  [40 %-50 %] 40 % (03/31 1612) Weight:  [72 kg (158 lb 11.7 oz)] 72 kg (158 lb 11.7 oz) (04/01 0347)  PHYSICAL EXAMINATION: General:  No distress,  Neuro:  Awake, alert, upset about being in hospital, nonfocal HEENT:  jvd wnl Cardiovascular:  s1 s2 RRR distant Lungs:  Wheezing about resolved anterior, ronchi LLL gone, great BS LLL Abdomen:  Soft, wound clean, no r, wound clean Musculoskeletal:  No edema Skin:  No rash   Recent Labs Lab 01/24/17 1048 01/27/17 0450  NA 137 134*  K 5.0 4.7  CL 103 99*  CO2 29 30  BUN 12 15  CREATININE 0.88 0.96  GLUCOSE 105* 156*    Recent Labs Lab 01/24/17 1048 01/27/17 0450  HGB 15.5 10.8*  HCT 44.1 30.6*  WBC 5.7 8.1  PLT 173 146*   Dg Chest  1 View  Result Date: 01/28/2017 CLINICAL DATA:  Acute onset of decreased O2 saturation. Initial encounter. EXAM: CHEST 1 VIEW COMPARISON:  Chest radiograph performed 05/01/2013 FINDINGS: Left basilar airspace opacification raises concern for pneumonia. A small left pleural effusion is suspected. The right lung appears clear. No pneumothorax is seen. The heart size silhouette is normal in size. No acute osseous abnormalities are identified. The patient is status post left-sided rotator cuff repair. IMPRESSION: Left basilar airspace opacification raises concern for pneumonia. Suspect small left pleural effusion. Electronically Signed   By: Garald Balding M.D.   On: 01/28/2017 22:43   Ct Chest W Contrast  Result Date: 01/28/2017 CLINICAL DATA:  Decreased oxygen saturation. Abnormal chest x-ray with left basilar opacity. EXAM: CT CHEST WITH CONTRAST TECHNIQUE: Multidetector CT imaging of the chest was performed during intravenous contrast administration. CONTRAST:  60mL ISOVUE-300 IOPAMIDOL (ISOVUE-300) INJECTION 61% COMPARISON:  Radiograph earlier this day. FINDINGS: Mild to moderate breathing motion artifact. Cardiovascular: The heart is normal in size. There is atherosclerosis of the thoracic aorta and arch vessels, mild aortic tortuosity. No central pulmonary embolus. Coronary artery calcifications are seen. Mediastinum/Nodes: Small lower paratracheal nodes not enlarged by size criteria. No hilar adenopathy. Leftward mediastinal shift secondary to left lung volume loss. Intraluminal contents within the upper esophagus which is patulous, more distal esophagus is decompressed. Lungs/Pleura: Moderate to advanced emphysema, most prominent at the apices. There is volume  loss in the left hemithorax, particularly of the left lower lobe. Debris within the right and left mainstem bronchi tracking distally. There is bronchial thickening, left greater than right, greatest in the paramediastinal regions centrally. On the  left this leads to bronchial occlusion distally in the left lower lobe with heterogeneous left lower lobe consolidation. No definite obstructing lesion is seen. No pleural fluid. Upper Abdomen: No acute abnormality. Atherosclerosis of upper abdominal vasculature. Musculoskeletal: Moderate to severe compression fracture of T8 with 75% loss of height centrally. This appears chronic. No definite acute osseous abnormality allowing for breathing motion. IMPRESSION: 1. Paramediastinal bronchial thickening, left greater than right with intraluminal debris in the dependent lower lobe bronchi are, also left greater than right. Volume loss in the left lung, prominent in the lower lobe with bronchial occlusion and consolidation. Overall findings suggest aspiration, likely chronic. There is no evidence of central obstructing lesion on the left, however bronchoscopic evaluation could be considered. 2. Moderate to advanced emphysema. 3. Aortic atherosclerosis and coronary artery calcifications. Electronically Signed   By: Jeb Levering M.D.   On: 01/28/2017 23:29   Dg Chest Port 1 View  Result Date: 01/30/2017 CLINICAL DATA:  Pneumonia EXAM: PORTABLE CHEST 1 VIEW COMPARISON:  01/28/2017 FINDINGS: Interval improvement lower left lower lobe infiltrate. No effusion. Decreased volume loss in the left lower lobe. COPD with hyperinflation.  Right lung is clear. IMPRESSION: Improvement in left lower lobe infiltrate and volume loss COPD Electronically Signed   By: Franchot Gallo M.D.   On: 01/30/2017 07:02    ASSESSMENT / PLAN:  MAJOR improved stats on Easter - to Room air  LLL ATX, mucous plugging, He may have aspirated Weak, poor cough mecahnics Advanced COPD with exacerbation s/p sigmoid resection Hypoxia slight out of proportion to CT findings -resolved  -chest pt, allow to dc in am  -mucomysts bid nebs x 2 more then dc, he tolerated this well -dc NTS -continued scheduled BDers -upright position succesful -no  role abx, he has opened this LL ATX -responded well to steroids, a major improvement wheezing, reduce slight, in am to pred -pcxr much improved, no repeat needed  -may need better BP control - add hydral as HR 70 -IS keep, doing well with this, add flutter to mobilize secretions further   Lavon Paganini. Titus Mould, MD, [place] Pgr: [facility]  Pulmonary and [place] Pager: [phone]  01/30/2017, 8:03 AM

## 2017-01-30 NOTE — Progress Notes (Signed)
Patient ID: [name] [name], male   DOB: 28-Jun-1951, 66 y.o.   MRN: [id number] [facility] Surgery Progress Note:   4 Days Post-Op  Subjective: Mental status is clear.  Patient is worried about paying his hospital bill and wants to leave AMA.   Objective: Vital signs in last 24 hours: Temp:  [97.8 F (36.6 C)-98.2 F (36.8 C)] 97.8 F (36.6 C) (04/01 0000) Pulse Rate:  [61-79] 66 (04/01 0600) Resp:  [13-23] 14 (04/01 0600) BP: (116-182)/(57-97) 143/97 (04/01 0600) SpO2:  [89 %-97 %] 90 % (04/01 0600) FiO2 (%):  [40 %-50 %] 40 % (03/31 1612) Weight:  [72 kg (158 lb 11.7 oz)] 72 kg (158 lb 11.7 oz) (04/01 0347)  Intake/Output from previous day: 03/31 0701 - 04/01 0700 In: 420 [P.O.:420] Out: 575 [Urine:500; Drains:75] Intake/Output this shift: No intake/output data recorded.  Physical Exam: Work of breathing is normal.  He is on room air after great pulmonary toilet.  Able to max out the incentive spirometer  Lab Results:  Results for orders placed or performed during the hospital encounter of 01/26/17 (from the past 48 hour(s))  Blood gas, arterial     Status: Abnormal   Collection Time: 01/28/17  9:58 PM  Result Value Ref Range   FIO2 100.00    Delivery systems NON-REBREATHER OXYGEN MASK    pH, Arterial 7.497 (H) 7.350 - 7.450   pCO2 arterial 36.3 32.0 - 48.0 mmHg   pO2, Arterial 46.8 (L) 83.0 - 108.0 mmHg   Bicarbonate 27.9 20.0 - 28.0 mmol/L   Acid-Base Excess 4.9 (H) 0.0 - 2.0 mmol/L   O2 Saturation 82.4 %   Patient temperature 98.6    Collection site RIGHT RADIAL    Drawn by [id number]    Sample type ARTERIAL DRAW    Allens test (pass/fail) PASS PASS  MRSA PCR Screening     Status: None   Collection Time: 01/29/17  4:28 AM  Result Value Ref Range   MRSA by PCR NEGATIVE NEGATIVE    Comment:        The GeneXpert MRSA Assay (FDA approved for NASAL specimens only), is one component of a comprehensive MRSA colonization surveillance program. It is not intended to  diagnose MRSA infection nor to guide or monitor treatment for MRSA infections.   Culture, expectorated sputum-assessment     Status: None   Collection Time: 01/29/17  3:10 PM  Result Value Ref Range   Specimen Description SPU    Special Requests NONE    Sputum evaluation THIS SPECIMEN IS ACCEPTABLE FOR SPUTUM CULTURE    Report Status 01/29/2017 FINAL   Culture, respiratory (NON-Expectorated)     Status: None (Preliminary result)   Collection Time: 01/29/17  3:10 PM  Result Value Ref Range   Specimen Description SPU    Special Requests NONE Reflexed from [id number]    Gram Stain      FEW WBC PRESENT, PREDOMINANTLY PMN MODERATE GRAM POSITIVE COCCI IN CLUSTERS FEW YEAST RARE GRAM POSITIVE RODS Performed at [facility], [address], [place], [place] [address]    Culture PENDING    Report Status PENDING     Radiology/Results: Dg Chest 1 View  Result Date: 01/28/2017 CLINICAL DATA:  Acute onset of decreased O2 saturation. Initial encounter. EXAM: CHEST 1 VIEW COMPARISON:  Chest radiograph performed 05/01/2013 FINDINGS: Left basilar airspace opacification raises concern for pneumonia. A small left pleural effusion is suspected. The right lung appears clear. No pneumothorax is seen. The heart  size silhouette is normal in size. No acute osseous abnormalities are identified. The patient is status post left-sided rotator cuff repair. IMPRESSION: Left basilar airspace opacification raises concern for pneumonia. Suspect small left pleural effusion. Electronically Signed   By: Garald Balding M.D.   On: 01/28/2017 22:43   Ct Chest W Contrast  Result Date: 01/28/2017 CLINICAL DATA:  Decreased oxygen saturation. Abnormal chest x-ray with left basilar opacity. EXAM: CT CHEST WITH CONTRAST TECHNIQUE: Multidetector CT imaging of the chest was performed during intravenous contrast administration. CONTRAST:  60mL ISOVUE-300 IOPAMIDOL (ISOVUE-300) INJECTION 61% COMPARISON:  Radiograph earlier this  day. FINDINGS: Mild to moderate breathing motion artifact. Cardiovascular: The heart is normal in size. There is atherosclerosis of the thoracic aorta and arch vessels, mild aortic tortuosity. No central pulmonary embolus. Coronary artery calcifications are seen. Mediastinum/Nodes: Small lower paratracheal nodes not enlarged by size criteria. No hilar adenopathy. Leftward mediastinal shift secondary to left lung volume loss. Intraluminal contents within the upper esophagus which is patulous, more distal esophagus is decompressed. Lungs/Pleura: Moderate to advanced emphysema, most prominent at the apices. There is volume loss in the left hemithorax, particularly of the left lower lobe. Debris within the right and left mainstem bronchi tracking distally. There is bronchial thickening, left greater than right, greatest in the paramediastinal regions centrally. On the left this leads to bronchial occlusion distally in the left lower lobe with heterogeneous left lower lobe consolidation. No definite obstructing lesion is seen. No pleural fluid. Upper Abdomen: No acute abnormality. Atherosclerosis of upper abdominal vasculature. Musculoskeletal: Moderate to severe compression fracture of T8 with 75% loss of height centrally. This appears chronic. No definite acute osseous abnormality allowing for breathing motion. IMPRESSION: 1. Paramediastinal bronchial thickening, left greater than right with intraluminal debris in the dependent lower lobe bronchi are, also left greater than right. Volume loss in the left lung, prominent in the lower lobe with bronchial occlusion and consolidation. Overall findings suggest aspiration, likely chronic. There is no evidence of central obstructing lesion on the left, however bronchoscopic evaluation could be considered. 2. Moderate to advanced emphysema. 3. Aortic atherosclerosis and coronary artery calcifications. Electronically Signed   By: Jeb Levering M.D.   On: 01/28/2017 23:29    Dg Chest Port 1 View  Result Date: 01/30/2017 CLINICAL DATA:  Pneumonia EXAM: PORTABLE CHEST 1 VIEW COMPARISON:  01/28/2017 FINDINGS: Interval improvement lower left lower lobe infiltrate. No effusion. Decreased volume loss in the left lower lobe. COPD with hyperinflation.  Right lung is clear. IMPRESSION: Improvement in left lower lobe infiltrate and volume loss COPD Electronically Signed   By: Franchot Gallo M.D.   On: 01/30/2017 07:02    Anti-infectives: Anti-infectives    Start     Dose/Rate Route Frequency Ordered Stop   01/26/17 2100  cefoTEtan (CEFOTAN) 2 g in dextrose 5 % 50 mL IVPB     2 g 100 mL/hr over 30 Minutes Intravenous Every 12 hours 01/26/17 1430 01/26/17 2047   01/26/17 0958  clindamycin (CLEOCIN) injection  Status:  Discontinued       As needed 01/26/17 0959 01/26/17 1236   01/26/17 0750  cefoTEtan in Dextrose 5% (CEFOTAN) 2-2.08 GM-% IVPB    Comments:  Danley Danker   : cabinet override      01/26/17 0750 01/26/17 1959   01/26/17 0730  cefoTEtan in Dextrose 5% (CEFOTAN) IVPB 2 g     2 g Intravenous On call to O.R. 01/26/17 0725 01/26/17 0845   01/26/17 0629  cefoTEtan (CEFOTAN)  2 g in dextrose 5 % 50 mL IVPB  Status:  Discontinued     2 g 100 mL/hr over 30 Minutes Intravenous On call to O.R. 01/26/17 0938 01/26/17 0725   01/26/17 0600  clindamycin (CLEOCIN) 900 mg, gentamicin (GARAMYCIN) 240 mg in sodium chloride 0.9 % 1,000 mL for intraperitoneal lavage  Status:  Discontinued      Intraperitoneal To Surgery 01/25/17 1354 01/26/17 1414      Assessment/Plan: Problem List: Patient Active Problem List   Diagnosis Date Noted  . Hypertension   . Hepatitis C   . Seizures ([place])   . Complete rectal prolapse with displacement of anal sphincter 01/26/2017  . Rectal prolapse s/p robotic LAR/rectopexy 01/26/2017 09/08/2016  . Tobacco abuse 09/08/2016  . Rectal bleeding 08/12/2016  . History of hepatitis C 08/12/2016  . Prolapsed internal hemorrhoids, grade 4  07/08/2016    Resolution of respiratory failure and OK to go back to the floor.  Appreciate CCM assistance in management.  4 Days Post-Op    LOS: 4 days   Matt B. Hassell Done, MD, [facility] [phone] beeper [phone]  01/30/2017 8:10 AM

## 2017-01-31 DIAGNOSIS — J9811 Atelectasis: Secondary | ICD-10-CM

## 2017-01-31 DIAGNOSIS — J432 Centrilobular emphysema: Secondary | ICD-10-CM

## 2017-01-31 MED ORDER — ALBUTEROL SULFATE (2.5 MG/3ML) 0.083% IN NEBU: 2.5000 mg | INHALATION_SOLUTION | RESPIRATORY_TRACT | Status: DC | PRN

## 2017-01-31 MED ORDER — IPRATROPIUM-ALBUTEROL 0.5-2.5 (3) MG/3ML IN SOLN: 3.0000 mL | Freq: Three times a day (TID) | RESPIRATORY_TRACT | Status: DC

## 2017-01-31 NOTE — Progress Notes (Signed)
Pt very agitated at this time. Pt complaining of pain at drain site but refusing pain medication. Pt stated " im going crazy and I have to get out of this hospital. I need to be put in restraints or I am going to do something. Im gonna run up the halls. Pt reassured MD would be around this morning to see him. Pt told me to leave him alone. [name] [name]

## 2017-01-31 NOTE — Progress Notes (Signed)
Patient given discharge instructions.  Expresses understanding of when to call the MD, medication changes, and follow up appointments.  Discharged to home in private vehicle.

## 2017-01-31 NOTE — Discharge Instructions (Signed)
SURGERY: POST OP INSTRUCTIONS °(Surgery for small bowel obstruction, colon resection, etc) ° ° °###################################################################### ° °EAT °Gradually transition to a high fiber diet with a fiber supplement over the next few days after discharge ° °WALK °Walk an hour a day.  Control your pain to do that.   ° °CONTROL PAIN °Control pain so that you can walk, sleep, tolerate sneezing/coughing, go up/down stairs. ° °HAVE A BOWEL MOVEMENT DAILY °Keep your bowels regular to avoid problems.  OK to try a laxative to override constipation.  OK to use an antidairrheal to slow down diarrhea.  Call if not better after 2 tries ° °CALL IF YOU HAVE PROBLEMS/CONCERNS °Call if you are still struggling despite following these instructions. °Call if you have concerns not answered by these instructions ° °###################################################################### ° ° °DIET °Follow a light diet the first few days at home.  Start with a bland diet such as soups, liquids, starchy foods, low fat foods, etc.  If you feel full, bloated, or constipated, stay on a ful liquid or pureed/blenderized diet for a few days until you feel better and no longer constipated. °Be sure to drink plenty of fluids every day to avoid getting dehydrated (feeling dizzy, not urinating, etc.). °Gradually add a fiber supplement to your diet over the next week.  Gradually get back to a regular solid diet.  Avoid fast food or heavy meals the first week as you are more likely to get nauseated. °It is expected for your digestive tract to need a few months to get back to normal.  It is common for your bowel movements and stools to be irregular.  You will have occasional bloating and cramping that should eventually fade away.  Until you are eating solid food normally, off all pain medications, and back to regular activities; your bowels will not be normal. °Focus on eating a low-fat, high fiber diet the rest of your life  (See Getting to Good Bowel Health, below). ° °CARE of your INCISION or WOUND °It is good for closed incision and even open wounds to be washed every day.  Shower every day.  Short baths are fine.  Wash the incisions and wounds clean with soap & water.    °If you have a closed incision(s), wash the incision with soap & water every day.  You may leave closed incisions open to air if it is dry.   You may cover the incision with clean gauze & replace it after your daily shower for comfort. °If you have skin tapes (Steristrips) or skin glue (Dermabond) on your incision, leave them in place.  They will fall off on their own like a scab.  You may trim any edges that curl up with clean scissors.  If you have staples, set up an appointment for them to be removed in the office in 10 days after surgery.  °If you have a drain, wash around the skin exit site with soap & water and place a new dressing of gauze or band aid around the skin every day.  Keep the drain site clean & dry.    °If you have an open wound with packing, see wound care instructions.  In general, it is encouraged that you remove your dressing and packing, shower with soap & water, and replace your dressing once a day.  Pack the wound with clean gauze moistened with normal (0.9%) saline to keep the wound moist & uninfected.  Pressure on the dressing for 30 minutes will stop most wound   bleeding.  Eventually your body will heal & pull the open wound closed over the next few months.  °Raw open wounds will occasionally bleed or secrete yellow drainage until it heals closed.  Drain sites will drain a little until the drain is removed.  Even closed incisions can have mild bleeding or drainage the first few days until the skin edges scab over & seal.   °If you have an open wound with a wound vac, see wound vac care instructions. ° ° ° ° °ACTIVITIES as tolerated °Start light daily activities --- self-care, walking, climbing stairs-- beginning the day after surgery.   Gradually increase activities as tolerated.  Control your pain to be active.  Stop when you are tired.  Ideally, walk several times a day, eventually an hour a day.   °Most people are back to most day-to-day activities in a few weeks.  It takes 4-8 weeks to get back to unrestricted, intense activity. °If you can walk 30 minutes without difficulty, it is safe to try more intense activity such as jogging, treadmill, bicycling, low-impact aerobics, swimming, etc. °Save the most intensive and strenuous activity for last (Usually 4-8 weeks after surgery) such as sit-ups, heavy lifting, contact sports, etc.  Refrain from any intense heavy lifting or straining until you are off narcotics for pain control.  You will have off days, but things should improve week-by-week. °DO NOT PUSH THROUGH PAIN.  Let pain be your guide: If it hurts to do something, don't do it.  Pain is your body warning you to avoid that activity for another week until the pain goes down. °You may drive when you are no longer taking narcotic prescription pain medication, you can comfortably wear a seatbelt, and you can safely make sudden turns/stops to protect yourself without hesitating due to pain. °You may have sexual intercourse when it is comfortable. If it hurts to do something, stop. ° °MEDICATIONS °Take your usually prescribed home medications unless otherwise directed.   °Blood thinners:  °Usually you can restart any strong blood thinners after the second postoperative day.  It is OK to take aspirin right away.    ° If you are on strong blood thinners (warfarin/Coumadin, Plavix, Xerelto, Eliquis, Pradaxa, etc), discuss with your surgeon, medicine PCP, and/or cardiologist for instructions on when to restart the blood thinner & if blood monitoring is needed (PT/INR blood check, etc).   ° ° °PAIN CONTROL °Pain after surgery or related to activity is often due to strain/injury to muscle, tendon, nerves and/or incisions.  This pain is usually  short-term and will improve in a few months.  °To help speed the process of healing and to get back to regular activity more quickly, DO THE FOLLOWING THINGS TOGETHER: °1. Increase activity gradually.  DO NOT PUSH THROUGH PAIN °2. Use Ice and/or Heat °3. Try Gentle Massage and/or Stretching °4. Take over the counter pain medication °5. Take Narcotic prescription pain medication for more severe pain ° °Good pain control = faster recovery.  It is better to take more medicine to be more active than to stay in bed all day to avoid medications. °1.  Increase activity gradually °Avoid heavy lifting at first, then increase to lifting as tolerated over the next 6 weeks. °Do not “push through” the pain.  Listen to your body and avoid positions and maneuvers than reproduce the pain.  Wait a few days before trying something more intense °Walking an hour a day is encouraged to help your body recover faster   and more safely.  Start slowly and stop when getting sore.  If you can walk 30 minutes without stopping or pain, you can try more intense activity (running, jogging, aerobics, cycling, swimming, treadmill, sex, sports, weightlifting, etc.) °Remember: If it hurts to do it, then don’t do it! °2. Use Ice and/or Heat °You will have swelling and bruising around the incisions.  This will take several weeks to resolve. °Ice packs or heating pads (6-8 times a day, 30-60 minutes at a time) will help sooth soreness & bruising. °Some people prefer to use ice alone, heat alone, or alternate between ice & heat.  Experiment and see what works best for you.  Consider trying ice for the first few days to help decrease swelling and bruising; then, switch to heat to help relax sore spots and speed recovery. °Shower every day.  Short baths are fine.  It feels good!  Keep the incisions and wounds clean with soap & water.   °3. Try Gentle Massage and/or Stretching °Massage at the area of pain many times a day °Stop if you feel pain - do not  overdo it °4. Take over the counter pain medication °This helps the muscle and nerve tissues become less irritable and calm down faster °Choose ONE of the following over-the-counter anti-inflammatory medications: °Acetaminophen 500mg tabs (Tylenol) 1-2 pills with every meal and just before bedtime (avoid if you have liver problems or if you have acetaminophen in you narcotic prescription) °Naproxen 220mg tabs (ex. Aleve, Naprosyn) 1-2 pills twice a day (avoid if you have kidney, stomach, IBD, or bleeding problems) °Ibuprofen 200mg tabs (ex. Advil, Motrin) 3-4 pills with every meal and just before bedtime (avoid if you have kidney, stomach, IBD, or bleeding problems) °Take with food/snack several times a day as directed for at least 2 weeks to help keep pain / soreness down & more manageable. °5. Take Narcotic prescription pain medication for more severe pain °A prescription for strong pain control is often given to you upon discharge (for example: oxycodone/Percocet, hydrocodone/Norco/Vicodin, or tramadol/Ultram) °Take your pain medication as prescribed. °Be mindful that most narcotic prescriptions contain Tylenol (acetaminophen) as well - avoid taking too much Tylenol. °If you are having problems/concerns with the prescription medicine (does not control pain, nausea, vomiting, rash, itching, etc.), please call us [phone] to see if we need to switch you to a different pain medicine that will work better for you and/or control your side effects better. °If you need a refill on your pain medication, you must call the office before 4 pm and on weekdays only.  By federal law, prescriptions for narcotics cannot be called into a pharmacy.  They must be filled out on paper & picked up from our office by the patient or authorized caretaker.  Prescriptions cannot be filled after 4 pm nor on weekends.   ° °WHEN TO CALL US [phone] °Severe uncontrolled or worsening pain  °Fever over 101 F (38.5 C) °Concerns with  the incision: Worsening pain, redness, rash/hives, swelling, bleeding, or drainage °Reactions / problems with new medications (itching, rash, hives, nausea, etc.) °Nausea and/or vomiting °Difficulty urinating °Difficulty breathing °Worsening fatigue, dizziness, lightheadedness, blurred vision °Other concerns °If you are not getting better after two weeks or are noticing you are getting worse, contact our office [phone] for further advice.  We may need to adjust your medications, re-evaluate you in the office, send you to the emergency room, or see what other things we can do to help. °The   clinic staff is available to answer your questions during regular business hours (8:30am-5pm).  Please don’t hesitate to call and ask to speak to one of our nurses for clinical concerns.    °A surgeon from [facility] is always on call at the hospitals 24 hours/day °If you have a medical emergency, go to the nearest emergency room or call 911. ° °FOLLOW UP in our office °One the day of your discharge from the hospital (or the next business weekday), please call [facility] to set up or confirm an appointment to see your surgeon in the office for a follow-up appointment.  Usually it is 2-3 weeks after your surgery.   °If you have skin staples at your incision(s), let the office know so we can set up a time in the office for the nurse to remove them (usually around 10 days after surgery). °Make sure that you call for appointments the day of discharge (or the next business weekday) from the hospital to ensure a convenient appointment time. °IF YOU HAVE DISABILITY OR FAMILY LEAVE FORMS, BRING THEM TO THE OFFICE FOR PROCESSING.  DO NOT GIVE THEM TO YOUR DOCTOR. ° °[facility] °[address], [address], [place],   [address] ? °[phone] - Main °[phone] - Toll Free,  [phone] - Fax °[email or web address] ° °GETTING TO GOOD BOWEL HEALTH. °It is  expected for your digestive tract to need a few months to get back to normal.  It is common for your bowel movements and stools to be irregular.  You will have occasional bloating and cramping that should eventually fade away.  Until you are eating solid food normally, off all pain medications, and back to regular activities; your bowels will not be normal.   °Avoiding constipation °The goal: ONE SOFT BOWEL MOVEMENT A DAY!    °Drink plenty of fluids.  Choose water first. °TAKE A FIBER SUPPLEMENT EVERY DAY THE REST OF YOUR LIFE °During your first week back home, gradually add back a fiber supplement every day °Experiment which form you can tolerate.   There are many forms such as powders, tablets, wafers, gummies, etc °Psyllium bran (Metamucil), methylcellulose (Citrucel), Miralax or Glycolax, Benefiber, Flax Seed.  °Adjust the dose week-by-week (1/2 dose/day to 6 doses a day) until you are moving your bowels 1-2 times a day.  Cut back the dose or try a different fiber product if it is giving you problems such as diarrhea or bloating. °Sometimes a laxative is needed to help jump-start bowels if constipated until the fiber supplement can help regulate your bowels.  If you are tolerating eating & you are farting, it is okay to try a gentle laxative such as double dose MiraLax, prune juice, or Milk of Magnesia.  Avoid using laxatives too often. °Stool softeners can sometimes help counteract the constipating effects of narcotic pain medicines.  It can also cause diarrhea, so avoid using for too long. °If you are still constipated despite taking fiber daily, eating solids, and a few doses of laxatives, call our office. °Controlling diarrhea °Try drinking liquids and eating bland foods for a few days to avoid stressing your intestines further. °Avoid dairy products (especially milk & ice cream) for a short time.  The intestines often can lose the ability to digest lactose when stressed. °Avoid foods that cause gassiness or  bloating.  Typical foods include beans and other legumes, cabbage, broccoli, and dairy foods.  Avoid greasy, spicy, fast foods.  Every person has   some sensitivity to other foods, so listen to your body and avoid those foods that trigger problems for you. Probiotics (such as active yogurt, Align, etc) may help repopulate the intestines and colon with normal bacteria and calm down a sensitive digestive tract Adding a fiber supplement gradually can help thicken stools by absorbing excess fluid and retrain the intestines to act more normally.  Slowly increase the dose over a few weeks.  Too much fiber too soon can backfire and cause cramping & bloating. It is okay to try and slow down diarrhea with a few doses of antidiarrheal medicines.   Bismuth subsalicylate (ex. Kayopectate, Pepto Bismol) for a few doses can help control diarrhea.  Avoid if pregnant.   Loperamide (Imodium) can slow down diarrhea.  Start with one tablet (2mg ) first.  Avoid if you are having fevers or severe pain.  ILEOSTOMY PATIENTS WILL HAVE CHRONIC DIARRHEA since their colon is not in use.    Drink plenty of liquids.  You will need to drink even more glasses of water/liquid a day to avoid getting dehydrated. Record output from your ileostomy.  Expect to empty the bag every 3-4 hours at first.  Most people with a permanent ileostomy empty their bag 4-6 times at the least.   Use antidiarrheal medicine (especially Imodium) several times a day to avoid getting dehydrated.  Start with a dose at bedtime & breakfast.  Adjust up or down as needed.  Increase antidiarrheal medications as directed to avoid emptying the bag more than 8 times a day (every 3 hours). Work with your wound ostomy nurse to learn care for your ostomy.  See ostomy care instructions. TROUBLESHOOTING IRREGULAR BOWELS 1) Start with a soft & bland diet. No spicy, greasy, or fried foods.  2) Avoid gluten/wheat or dairy products from diet to see if symptoms improve. 3) Miralax  17gm or flax seed mixed in [place]. water or juice-daily. May use 2-4 times a day as needed. 4) Gas-X, Phazyme, etc. as needed for gas & bloating.  5) Prilosec (omeprazole) over-the-counter as needed 6)  Consider probiotics (Align, Activa, etc) to help calm the bowels down  Call your doctor if you are getting worse or not getting better.  Sometimes further testing (cultures, endoscopy, X-ray studies, CT scans, bloodwork, etc.) may be needed to help diagnose and treat the cause of the diarrhea. [facility], [place], [place], [place], [place]  [phone] - Main.    [phone]  - Toll Free.   [phone] - Fax [email or web address]  HEMORRHOIDS  The rectum is the last foot of your colon, and it naturally stretches to hold stool.  Hemorrhoidal piles are natural clusters of blood vessels that help the rectum and anal canal stretch to hold stool and allow bowel movements to eliminate feces.   Hemorrhoids are abnormally swollen blood vessels in the rectum.  Too much pressure in the rectum causes hemorrhoids by forcing blood to stretch and bulge the walls of the veins, sometimes even rupturing them.  Hemorrhoids can become like varicose veins you might see on a person's legs.  Most people will develop a flare of hemorrhoids in their lifetime.  When bulging hemorrhoidal veins are irritated, they can swell, burn, itch, cause pain, and bleed.  Most flares will calm down gradually own within a few weeks.  However, once hemorrhoids are created, they are difficult to get rid of completely and tend to flare more easily than the first flare.   Fortunately,  good habits and simple medical treatment usually control hemorrhoids well, and surgery is needed only in severe cases. Types of Hemorrhoids:  Internal hemorrhoids usually don't initially hurt or itch; they are deep inside the rectum and usually have no sensation. If they begin to push out (prolapse), pain and  burning can occur.  However, internal hemorrhoids can bleed.  Anal bleeding should not be ignored since bleeding could come from a dangerous source like colorectal cancer, so persistent rectal bleeding should be investigated by a doctor, sometimes with a colonoscopy.  External hemorrhoids cause most of the symptoms - pain, burning, and itching. Nonirritated hemorrhoids can look like small skin tags coming out of the anus.   Thrombosed hemorrhoids can form when a hemorrhoid blood vessel bursts and causes the hemorrhoid to suddenly swell.  A purple blood clot can form in it and become an excruciatingly painful lump at the anus. Because of these unpleasant symptoms, immediate incision and drainage by a surgeon at an office visit can provide much relief of the pain.    PREVENTION Avoiding the most frequent causes listed below will prevent most cases of hemorrhoids: Constipation Hard stools Diarrhea  Constant sitting  Straining with bowel movements Sitting on the toilet for a long time  Severe coughing  episodes Pregnancy / Childbirth  Heavy Lifting  Sometimes avoiding the above triggers is difficult:  How can you avoid sitting all day if you have a seated job? Also, we try to avoid coughing and diarrhea, but sometimes its beyond your control.  Still, there are some practical hints to help: Keep the anal and genital area clean.  Moistened tissues such as flushable wet wipes are less irritating than toilet paper.  Using irrigating showers or bottle irrigation washing gently cleans this sensitive area.   Avoid dry toilet paper when cleaning after bowel movements.  Marland Kitchen Keep the anal and genital area dry.  Lightly pat the rectal area dry.  Avoid rubbing.  Talcum or baby powders can help GET YOUR STOOLS SOFT.   This is the most important way to prevent irritated hemorrhoids.  Hard stools are like sandpaper to the anorectal canal and will cause more problems.  The goal: ONE SOFT BOWEL MOVEMENT A DAY!  BMs from  every other day to 3 times a day is a tolerable range Treat coughing, diarrhea and constipation early since irritated hemorrhoids may soon follow.  If your main job activity is seated, always stand or walk during your breaks. Make it a point to stand and walk at least 5 minutes every hour and try to shift frequently in your chair to avoid direct rectal pressure.  Always exhale as you strain or lift. Don't hold your breath.  Do not delay or try to prevent a bowel movement when the urge is present. Exercise regularly (walking or jogging 60 minutes a day) to stimulate the bowels to move. No reading or other activity while on the toilet. If bowel movements take longer than 5 minutes, you are too constipated. AVOID CONSTIPATION Drink plenty of liquids (1 1/2 to 2 quarts of water and other fluids a day unless fluid restricted for another medical condition). Liquids that contain caffeine (coffee a, tea, soft drinks) can be dehydrating and should be avoided until constipation is controlled. Consider minimizing milk, as dairy products may be constipating. Eat plenty of fiber (30g a day ideal, more if needed).  Fiber is the undigested part of plant food that passes into the colon, acting as natures broom to  encourage bowel motility and movement.  Fiber can absorb and hold large amounts of water. This results in a larger, bulkier stool, which is soft and easier to pass.  Eating foods high in fiber - 12 servings - such as  Vegetables: Root (potatoes, carrots, turnips), Leafy green (lettuce, salad greens, celery, spinach), High residue (cabbage, broccoli, etc.) Fruit: Fresh, Dried (prunes, apricots, cherries), Stewed (applesauce)  Whole grain breads, pasta, whole wheat Bran cereals, muffins, etc. Consider adding supplemental bulking fiber which retains large volumes of water: Psyllium ground seeds (native plant from [place])--available as Metamucil, Konsyl, Effersyllium, Per Diem Fiber, or the less  expensive generic forms.  Citrucel  (methylcellulose wood fiber) . FiberCon (Polycarbophil) Polyethylene Glycol - and artificial fiber commonly called Miralax or Glycolax.  It is helpful for people with gassy or bloated feelings with regular fiber Flax Seed - a less gassy natural fiber  Laxatives can be useful for a short period if constipation is severe Osmotics (Milk of Magnesia, Fleets Phospho-Soda, Magnesium Citrate)  Stimulants (Senokot,   Castor Oil,  Dulcolax, Ex-Lax)    Laxatives are not a good long-term solution as it can stress the bowels and cause too much mineral loss and dehydration.   Avoid taking laxatives for more than 7 days in a row.  AVOID DIARRHEA Switch to liquids and simpler foods for a few days to avoid stressing your intestines further. Avoid dairy products (especially milk & ice cream) for a short time.  The intestines often can lose the ability to digest lactose when stressed. Avoid foods that cause gassiness or bloating.  Typical foods include beans and other legumes, cabbage, broccoli, and dairy foods.  Every person has some sensitivity to other foods, so listen to your body and avoid those foods that trigger problems for you. Adding fiber (Citrucel, Metamucil, FiberCon, Flax seed, Miralax) gradually can help thicken stools by absorbing excess fluid and retrain the intestines to act more normally.  Slowly increase the dose over a few weeks.  Too much fiber too soon can backfire and cause cramping & bloating. Probiotics (such as active yogurt, Align, etc) may help repopulate the intestines and colon with normal bacteria and calm down a sensitive digestive tract.  Most studies show it to be of mild help, though, and such products can be costly. Medicines: Bismuth subsalicylate (ex. Kayopectate, Pepto Bismol) every 30 minutes for up to 6 doses can help control diarrhea.  Avoid if pregnant. Loperamide (Immodium) can slow down diarrhea.  Start with two tablets (4mg  total)  first and then try one tablet every 6 hours.  Avoid if you are having fevers or severe pain.  If you are not better or start feeling worse, stop all medicines and call your doctor for advice Call your doctor if you are getting worse or not better.  Sometimes further testing (cultures, endoscopy, X-ray studies, bloodwork, etc) may be needed to help diagnose and treat the cause of the diarrhea.  TROUBLESHOOTING IRREGULAR BOWELS 1) Avoid extremes of bowel movements (no bad constipation/diarrhea) 2) Miralax 17gm mixed in 8oz. water or juice-daily. May use BID as needed.  3) Gas-x,Phazyme, etc. as needed for gas & bloating.  4) Soft,bland diet. No spicy,greasy,fried foods.  5) Prilosec over-the-counter as needed  6) May hold gluten/wheat products from diet to see if symptoms improve.  7)  May try probiotics (Align, Activa, etc) to help calm the bowels down 7) If symptoms become worse call back immediately.   TREATMENT OF HEMORRHOID FLARE If these preventive  measures fail, you must take action right away! Hemorrhoids are one condition that can be mild in the morning and become intolerable by nightfall. Most hemorrhoidal flares take several weeks to calm down.  These suggestions can help: Warm soaks.  This helps more than any topical medication.  Use up to 8 times a day.  Usually sitz baths or sitting in a warm bathtub helps.  Sitting on moist warm towels are helpful.  Switching to ice packs/cool compresses can be helpful  Use a Sitz Bath 4-8 times a day for relief A sitz bath is a warm water bath taken in the sitting position that covers only the hips and buttocks. It may be used for either healing or hygiene purposes. Sitz baths are also used to relieve pain, itching, or muscle spasms. The water may contain medicine. Moist heat will help you heal and relax.  HOME CARE INSTRUCTIONS  Take 3 to 4 sitz baths a day. 1. Fill the bathtub half full with warm water. 2. Sit in the water and open the drain a  little. 3. Turn on the warm water to keep the tub half full. Keep the water running constantly. 4. Soak in the water for 15 to 20 minutes. 5. After the sitz bath, pat the affected area dry first. SEEK MEDICAL CARE IF:  You get worse instead of better. Stop the sitz baths if you get worse.  Normalize your bowels.  Extremes of diarrhea or constipation will make hemorrhoids worse.  One soft bowel movement a day is the goal.  Fiber can help get your bowels regular Wet wipes instead of toilet paper Pain control with a NSAID such as ibuprofen (Advil) or naproxen (Aleve) or acetaminophen (Tylenol) around the clock.  Narcotics are constipating and should be minimized if possible Topical creams contain steroids (bydrocortisone) or local anesthetic (xylocaine) can help make pain and itching more tolerable.   EVALUATION If hemorrhoids are still causing problems, you could benefit by an evaluation by a surgeon.  The surgeon will obtain a history and examine you.  If hemorrhoids are diagnosed, some therapies can be offered in the office, usually with an anoscope into the less sensitive area of the rectum: -injection of hemorrhoids (sclerotherapy) can scar the blood vessels of the swollen/enlarged hemorrhoids to help shrink them down to a more normal size -rubber banding of the enlarged hemorrhoids to help shrink them down to a more normal size -drainage of the blood clot causing a thrombosed hemorrhoid,  to relieve the severe pain   While 90% of the time such problems from hemorrhoids can be managed without preceding to surgery, sometimes the hemorrhoids require a operation to control the problem (uncontrolled bleeding, prolapse, pain, etc.).   This involves being placed under general anesthesia where the surgeon can confirm the diagnosis and remove, suture, or staple the hemorrhoid(s).  Your surgeon can help you treat the problem appropriately.    Pelvic floor muscle training exercises ("Kegels") can help  strengthen the muscles under the uterus, bladder, and bowel (large intestine). They can help both men and women who have problems with urine leakage or bowel control.  A pelvic floor muscle training exercise is like pretending that you have to urinate, and then holding it. You relax and tighten the muscles that control urine flow. It's important to find the right muscles to tighten.  The next time you have to urinate, start to go and then stop. Feel the muscles in your vagina, bladder, or anus get tight and  move up. These are the pelvic floor muscles. If you feel them tighten, you've done the exercise right. If you are still not sure whether you are tightening the right muscles, keep in mind that all of the muscles of the pelvic floor relax and contract at the same time. Because these muscles control the bladder, rectum, and vagina, the following tips may help: Women: Insert a finger into your vagina. Tighten the muscles as if you are holding in your urine, then let go. You should feel the muscles tighten and move up and down.  Men: Insert a finger into your rectum. Tighten the muscles as if you are holding in your urine, then let go. You should feel the muscles tighten and move up and down. These are the same muscles you would tighten if you were trying to prevent yourself from passing gas.  It is very important that you keep the following muscles relaxed while doing pelvic floor muscle training exercises: Abdominal  Buttocks (the deeper, anal sphincter muscle should contract)  Thigh   A woman can also strengthen these muscles by using a vaginal cone, which is a weighted device that is inserted into the vagina. Then you try to tighten the pelvic floor muscles to hold the device in place. If you are unsure whether you are doing the pelvic floor muscle training correctly, you can use biofeedback and electrical stimulation to help find the correct muscle group to work. Biofeedback is a method of  positive reinforcement. Electrodes are placed on the abdomen and along the anal area. Some therapists place a sensor in the vagina in women or anus in men to monitor the contraction of pelvic floor muscles.  A monitor will display a graph showing which muscles are contracting and which are at rest. The therapist can help find the right muscles for performing pelvic floor muscle training exercises.   PERFORMING PELVIC FLOOR EXERCISES: 1. Begin by emptying your bladder. 2. Tighten the pelvic floor muscles and hold for a count of 10. 3. Relax the muscles completely for a count of 10. 4. Do 10 repititions, 3 to 5 times a day (morning, afternoon, and night). You can do these exercises at any time and any place. Most people prefer to do the exercises while lying down or sitting in a chair. After 4 - 6 weeks, most people notice some improvement. It may take as long as 3 months to see a major change. After a couple of weeks, you can also try doing a single pelvic floor contraction at times when you are likely to leak (for example, while getting out of a chair). A word of caution: Some people feel that they can speed up the progress by increasing the number of repetitions and the frequency of exercises. However, over-exercising can instead cause muscle fatigue and increase urine leakage. If you feel any discomfort in your abdomen or back while doing these exercises, you are probably doing them wrong. Breathe deeply and relax your body when you are doing these exercises. Make sure you are not tightening your stomach, thigh, buttock, or chest muscles. When done the right way, pelvic floor muscle exercises have been shown to be very effective at improving urinary continence.  Pelvic Floor Pain / Incontinence  Do you suffer from pelvic pain or incontinence? Do you have pain in the pelvis, low back or hips that is associated with sitting, walking, urination or intercourse? Have you experienced leaking of urine or  feces when coughing, sneezing or laughing?  Do you have pain in the pelvic area associated with cancer?  These are conditions that are common with pelvic floor muscle dysfunction. Over time, due to stress, scar tissue, surgeries and the natural course of aging, our muscles may become weak or overstressed and can spasm. This can lead to pain, weakness, incontinence or decreased quality of life.  Men and women with pelvic floor dysfunction frequently describe:  A falling out feeling. Pain or burning in the abdomen, tailbone or perineal area. Constipation or bowel elimination problems or difficulty initiating urination. Unresolved low back or hip pain. Frequency and urgency when going to the bathroom. Leaking of urine or feces. Pain with intercourse.  [email or web address]  To make a referral or for more information about [facility] Pelvic Floor Therapy Program, call  [facility]) - [place] [phone] [place]) - [facility]) - [phone]

## 2017-01-31 NOTE — Discharge Summary (Signed)
Physician Discharge Summary  Patient ID: MIQUEL STACKS MRN: [id number] DOB/AGE: [id number] 68 y.o.  Admit date: 01/26/2017 Discharge date: 01/31/2017  Admission Diagnoses: Rectal prolapse  Discharge Diagnoses:  Principal Problem:   Rectal prolapse s/p robotic LAR/rectopexy 01/26/2017 Active Problems:   Prolapsed internal hemorrhoids, grade 4   History of hepatitis C   Tobacco abuse   Complete rectal prolapse with displacement of anal sphincter   Hypertension   Hepatitis C   Seizures ([place])   Discharged Condition: good  Hospital Course: The patient was admitted after surgery.  Over the next few days, his diet was advanced.  He tolerated this well.  On POD 2 He began to have increasing oxygen requirements. A chest x-ray and ABG were performed. These were concerning for possible PE or severe pneumonia. A CT chest was ordered. This showed atelectasis in the left lower lobe. He went down to the stepdown unit and underwent chest physical therapy and respiratory therapy. Pulmonology was consult it and started him on a short low dose course of steroids. He responded well to this and was on room air by the following morning. He was then transferred back up to the floor and remained stable throughout the next day. By postop day 5 he was in stable condition for discharge to home.  Consults: pulmonary/intensive care  Significant Diagnostic Studies: labs: cbc, chemistrty  Treatments: IV hydration, analgesia: oxycodone, steroids: solu-medrol, respiratory therapy: O2, albuterol/atropine nebulizer and chest PT and surgery: robotic colon resection and rectopexy.  Discharge Exam: Blood pressure 140/69, pulse (!) 56, temperature 97.8 F (36.6 C), temperature source Oral, resp. rate 18, height 5\' 10"  (1.778 m), weight 68.9 kg (152 lb), SpO2 95 %. General appearance: alert and cooperative Resp: clear to auscultation bilaterally GI: normal findings: soft, non-tender Incision/Wound: clean dry  intact  Disposition: 01-Home or Self Care  Discharge Instructions    Call MD for:    Complete by:  As directed    FEVER > 101.5 F  (temperatures < 101.5 F are not significant)   Call MD for:  extreme fatigue    Complete by:  As directed    Call MD for:  persistant dizziness or light-headedness    Complete by:  As directed    Call MD for:  persistant nausea and vomiting    Complete by:  As directed    Call MD for:  redness, tenderness, or signs of infection (pain, swelling, redness, odor or green/yellow discharge around incision site)    Complete by:  As directed    Call MD for:  severe uncontrolled pain    Complete by:  As directed    Diet - low sodium heart healthy    Complete by:  As directed    Follow a light diet the first few days at home.   Start with a bland diet such as soups, liquids, starchy foods, low fat foods, etc.   If you feel full, bloated, or constipated, stay on a full liquid or pureed/blenderized diet for a few days until you feel better and no longer constipated. Be sure to drink plenty of fluids every day to avoid getting dehydrated (feeling dizzy, not urinating, etc.). Gradually add a fiber supplement to your diet   Discharge instructions    Complete by:  As directed    See Discharge Instructions If you are not getting better after two weeks or are noticing you are getting worse, contact our office [phone] for further advice.  We may need to  adjust your medications, re-evaluate you in the office, send you to the emergency room, or see what other things we can do to help. The clinic staff is available to answer your questions during regular business hours (8:30am-5pm).  Please don't hesitate to call and ask to speak to one of our nurses for clinical concerns.    A surgeon from [facility] is always on call at the hospitals 24 hours/day If you have a medical emergency, go to the nearest emergency room or call 911.   Driving Restrictions     Complete by:  As directed    You may drive when you are no longer taking narcotic prescription pain medication, you can comfortably wear a seatbelt, and you can safely make sudden turns/stops to protect yourself without hesitating due to pain.   Increase activity slowly    Complete by:  As directed    Start light daily activities --- self-care, walking, climbing stairs- beginning the day after surgery.  Gradually increase activities as tolerated.  Control your pain to be active.  Stop when you are tired.  Ideally, walk several times a day, eventually an hour a day.   Most people are back to most day-to-day activities in a few weeks.  It takes 4-8 weeks to get back to unrestricted, intense activity. If you can walk 30 minutes without difficulty, it is safe to try more intense activity such as jogging, treadmill, bicycling, low-impact aerobics, swimming, etc. Save the most intensive and strenuous activity for last (Usually 4-8 weeks after surgery) such as sit-ups, heavy lifting, contact sports, etc.  Refrain from any intense heavy lifting or straining until you are off narcotics for pain control.  You will have off days, but things should improve week-by-week. DO NOT PUSH THROUGH PAIN.  Let pain be your guide: If it hurts to do something, don't do it.  Pain is your body warning you to avoid that activity for another week until the pain goes down.   Lifting restrictions    Complete by:  As directed    If you can walk 30 minutes without difficulty, it is safe to try more intense activity such as jogging, treadmill, bicycling, low-impact aerobics, swimming, etc. Save the most intensive and strenuous activity for last (Usually 4-8 weeks after surgery) such as sit-ups, heavy lifting, contact sports, etc.  Refrain from any intense heavy lifting or straining until you are off narcotics for pain control.  You will have off days, but things should improve week-by-week. DO NOT PUSH THROUGH PAIN.  Let pain be your  guide: If it hurts to do something, don't do it.  Pain is your body warning you to avoid that activity for another week until the pain goes down.   May walk up steps    Complete by:  As directed    No dressing needed    Complete by:  As directed    It is good for closed incision and even open wounds to be washed every day.  Shower every day.  Short baths are fine.  Wash the incisions and wounds clean with soap & water.    If you have a closed incision(s), wash the incision with soap & water every day.  You may leave closed incisions open to air if it is dry.   You may cover the incision with clean gauze & replace it after your daily shower for comfort. If you have skin tapes (Steristrips) or skin glue (Dermabond) on your incision, leave  them in place.  They will fall off on their own like a scab.  You may trim any edges that curl up with clean scissors.  If you have staples, set up an appointment for them to be removed in the office in 10 days after surgery.  If you have a drain, wash around the skin exit site with soap & water and place a new dressing of gauze or band aid around the skin every day.  Keep the drain site clean & dry.   Sexual Activity Restrictions    Complete by:  As directed    You may have sexual intercourse when it is comfortable. If it hurts to do something, stop.      Follow-up Information    GROSS,STEVEN C., MD. Schedule an appointment as soon as possible for a visit in 2 week(s).   Specialty:  General Surgery Why:  To follow up after your operation, To follow up after your hospital stay Contact information: [address] [place] [phone]           Signed: Rosario Adie 11/03/2438, 1:02 AM

## 2017-01-31 NOTE — Progress Notes (Signed)
   Name: [name] [name] MRN: [id number] DOB: 1951-10-24    ADMISSION DATE:  01/26/2017 CONSULTATION DATE:  01/26/17   REFERRING MD :  Dr Hassell Done, MD  CHIEF COMPLAINT:  PNA, SOB  BRIEF PATIENT DESCRIPTION: 66 yr old copd, s/p sigmoid resection for rectal prolapse, now with concerns PNA  SIGNIFICANT EVENTS  3/29- sigmoid resection 3/31- SOB, asp  Concerns 4/1- off O2 , improved  STUDIES:  CT chest 3/30 - Paramediastinal bronchial thickening, left greater than right with intraluminal debris in the dependent lower lobe bronchi are, also left greater than right. Volume loss in the left lung, prominent in the lower lobe with bronchial occlusion and consolidation. Overall findings suggest aspiration, likely chronic. There is no evidence of central obstructing lesion on the left 2. Moderate to advanced emphysema.  SUBJECTIVE: feels much better, denies CP, dyspnea Afebrile, no abd pain  VITAL SIGNS: Temp:  [97.8 F (36.6 C)-98.8 F (37.1 C)] 97.8 F (36.6 C) (04/02 0505) Pulse Rate:  [56-69] 56 (04/02 0505) Resp:  [15-18] 18 (04/02 0505) BP: (126-151)/(60-75) 140/69 (04/02 0505) SpO2:  [92 %-96 %] 95 % (04/02 0505) Weight:  [152 lb (68.9 kg)] 152 lb (68.9 kg) (04/02 0505)  PHYSICAL EXAMINATION: General:  No distress,  Neuro:  Awake, alert, , nonfocal HEENT:  jvd wnl Cardiovascular:  s1 s2 RRR distant Lungs: no rhonchi, decreased BL Abdomen:  Soft, wound clean, no r, wound clean Musculoskeletal:  No edema Skin:  No rash   Recent Labs Lab 01/24/17 1048 01/27/17 0450  NA 137 134*  K 5.0 4.7  CL 103 99*  CO2 29 30  BUN 12 15  CREATININE 0.88 0.96  GLUCOSE 105* 156*    Recent Labs Lab 01/24/17 1048 01/27/17 0450  HGB 15.5 10.8*  HCT 44.1 30.6*  WBC 5.7 8.1  PLT 173 146*   Dg Chest Port 1 View  Result Date: 01/30/2017 CLINICAL DATA:  Pneumonia EXAM: PORTABLE CHEST 1 VIEW COMPARISON:  01/28/2017 FINDINGS: Interval improvement lower left lower lobe infiltrate. No  effusion. Decreased volume loss in the left lower lobe. COPD with hyperinflation.  Right lung is clear. IMPRESSION: Improvement in left lower lobe infiltrate and volume loss COPD Electronically Signed   By: Franchot Gallo M.D.   On: 01/30/2017 07:02    ASSESSMENT / PLAN:   LLL ATX, mucous plugging -improved on FU CXR  Advanced COPD with exacerbation s/p sigmoid resection Hypoxia slight out of proportion to CT findings -resolved  -mobilise, dc chest PT/ mucomyst -continued scheduled BDers -dc steroids since bspasm resolved -may need better BP control - resume amlodipin & atenolol when ok -IS  q 1-2 h when awake  Out pt FU made, pl call as needed  Kara Mead MD. Shade Flood. [facility] Pager [phone] If no response call [phone]    01/31/2017, 9:01 AM

## 2017-02-08 ENCOUNTER — Inpatient Hospital Stay: Payer: Self-pay | Admitting: Pulmonary Disease

## 2017-02-15 DIAGNOSIS — I739 Peripheral vascular disease, unspecified: Secondary | ICD-10-CM | POA: Diagnosis not present

## 2017-02-15 DIAGNOSIS — K5900 Constipation, unspecified: Secondary | ICD-10-CM | POA: Diagnosis not present

## 2017-02-15 DIAGNOSIS — I10 Essential (primary) hypertension: Secondary | ICD-10-CM | POA: Diagnosis not present

## 2017-05-24 ENCOUNTER — Other Ambulatory Visit: Payer: Self-pay

## 2017-05-24 DIAGNOSIS — I739 Peripheral vascular disease, unspecified: Secondary | ICD-10-CM

## 2017-07-11 ENCOUNTER — Encounter: Payer: Self-pay | Admitting: Vascular Surgery

## 2017-07-12 ENCOUNTER — Ambulatory Visit (HOSPITAL_COMMUNITY)
Admission: RE | Admit: 2017-07-12 | Discharge: 2017-07-12 | Disposition: A | Discharge status: Home / Self Care | Payer: Medicare Other | Source: Ambulatory Visit | Attending: Vascular Surgery | Admitting: Vascular Surgery

## 2017-07-12 ENCOUNTER — Ambulatory Visit (INDEPENDENT_AMBULATORY_CARE_PROVIDER_SITE_OTHER): Payer: Medicare Other | Admitting: Vascular Surgery

## 2017-07-12 ENCOUNTER — Encounter: Payer: Self-pay | Admitting: Vascular Surgery

## 2017-07-12 VITALS — BP 167/83 | HR 63 | Temp 97.3°F | Resp 18 | Ht 69.0 in | Wt 155.0 lb

## 2017-07-12 DIAGNOSIS — I70203 Unspecified atherosclerosis of native arteries of extremities, bilateral legs: Secondary | ICD-10-CM | POA: Insufficient documentation

## 2017-07-12 DIAGNOSIS — I739 Peripheral vascular disease, unspecified: Secondary | ICD-10-CM | POA: Diagnosis not present

## 2017-07-12 NOTE — Progress Notes (Signed)
V[facility]in [facility]ist of [place]  Patient name: [name] [name] MRN: [id number] DOB: 08-21-1951 Sex: male  REASON FOR CONSULT: Evaluation of limiting claudication bilateral lower extremities  HPI: [name] [name] is a 66 y.o. male, who is here today for evaluation of limiting claudication. He is a very pleasant 66 year old gentleman who reports calf claudication with a relatively short distance walking. He reports that he enjoys walking around [place] and is unable to do this due to pain in his calves. Fortunately has had no history of tissue loss and no arterial rest pain. He reports that this occurs in his calf and is an achy sensation that he has to stop and rest and then can proceed again. He denies any discomfort into his buttocks or thighs. He does have a long history of cigarette smoking with COPD and emphysema. Has been unable to stop in the past. Also does have a history of fractured vertebra. He denies any coronary artery disease.  Past Medical History:  Diagnosis Date  . Back pain    from fall 16 yrs ago-had 4 fx vertebrae  . COPD (chronic obstructive pulmonary disease) ([place])   . Emphysema, unspecified ([place])   . Head injuries    16 yrs ago  . Hepatitis   . Hepatitis C    Treated in the past, states he is cured.   . Hypertension   . Seizures ([place])    last seizure  25 yrs ago.Epilepsy    Family History  Problem Relation Age of Onset  . Heart disease Father   . Anesthesia problems Neg Hx   . Hypotension Neg Hx   . Malignant hyperthermia Neg Hx   . Pseudochol deficiency Neg Hx   . Colon cancer Neg Hx     SOCIAL HISTORY: Social History   Social History  . Marital status: Divorced    Spouse name: N/A  . Number of children: N/A  . Years of education: N/A   Occupational History  . Not on file.   Social History Main Topics  . Smoking status: Current Every Day Smoker    Packs/day: 0.25    Years: 40.00   Types: Cigarettes  . Smokeless tobacco: Never Used  . Alcohol use 1.8 oz/week    2 Cans of beer, 1 Shots of liquor per week     Comment: occasionally   . Drug use: No  . Sexual activity: Yes    Birth control/ protection: None   Other Topics Concern  . Not on file   Social History Narrative  . No narrative on file    No Known Allergies  Current Outpatient Prescriptions  Medication Sig Dispense Refill  . amLODipine (NORVASC) 5 MG tablet Take 5 mg by mouth every evening.     Marland Kitchen aspirin EC 81 MG tablet Take 81 mg by mouth daily.    Marland Kitchen atenolol (TENORMIN) 50 MG tablet Take 50 mg by mouth every evening.     . Cholecalciferol (VITAMIN D3) 1000 units CAPS Take 1,000 Units by mouth daily.     . divalproex (DEPAKOTE) 500 MG EC tablet Take 500 mg by mouth 2 (two) times daily.      . Magnesium 250 MG TABS Take 250 mg by mouth daily.     . Omega-3 Krill Oil 1000 MG CAPS Take 1 capsule by mouth 2 (two) times daily.    Marland Kitchen oxyCODONE (OXY IR/ROXICODONE) 5 MG immediate release tablet Take 0.5-2 tablets (2.5-10 mg total)  by mouth every 6 (six) hours as needed for severe pain or breakthrough pain. 40 tablet 0   No current facility-administered medications for this visit.     REVIEW OF SYSTEMS:  [X]  denotes positive finding, [ ]  denotes negative finding Cardiac  Comments:  Chest pain or chest pressure:    Shortness of breath upon exertion:    Short of breath when lying flat:    Irregular heart rhythm:        Vascular    Pain in calf, thigh, or hip brought on by ambulation: x   Pain in feet at night that wakes you up from your sleep:     Blood clot in your veins:    Leg swelling:         Pulmonary    Oxygen at home:    Productive cough:     Wheezing:  x       Neurologic    Sudden weakness in arms or legs:     Sudden numbness in arms or legs:     Sudden onset of difficulty speaking or slurred speech:    Temporary loss of vision in one eye:     Problems with dizziness:           Gastrointestinal    Blood in stool:     Vomited blood:         Genitourinary    Burning when urinating:     Blood in urine:        Psychiatric    Major depression:         Hematologic    Bleeding problems:    Problems with blood clotting too easily:        Skin    Rashes or ulcers:        Constitutional    Fever or chills:      PHYSICAL EXAM: Vitals:   07/12/17 1025 07/12/17 1028  BP: (!) 169/85 (!) 167/83  Pulse: 63   Resp: 18   Temp: (!) 97.3 F (36.3 C)   TempSrc: Oral   SpO2: 99%   Weight: 155 lb (70.3 kg)   Height: 5\' 9"  (1.753 m)     GENERAL: The patient is a well-nourished male, in no acute distress. The vital signs are documented above. CARDIOVASCULAR: Carotid arteries without bruits bilaterally. 2+ radial pulses bilaterally. He does have palpable but somewhat diminished femoral pulses and absent popliteal and distal pulses PULMONARY: There is good air exchange  ABDOMEN: Soft and non-tender. No aneurysm palpable and no bruits noted  MUSCULOSKELETAL: There are no major deformities or cyanosis. NEUROLOGIC: No focal weakness or paresthesias are detected. SKIN: There are no ulcers or rashes noted. PSYCHIATRIC: The patient has a normal affect.  DATA:  Noninvasive vascular lab studies were reviewed with the patient today. This shows an ankle arm index of 0.72 on the right and 0.51 on the left with monophasic dorsalis pedis and posterior tibial signals bilaterally  MEDICAL ISSUES: Had long discussion with patient regarding his lower from any claudication symptoms. 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 I explained the critical importance of smoking cessation to slow the progression of this and also improve his walking ability. His symptoms seem most consistent with superficial femoral artery occlusive disease but may have aortoiliac disease as well. I have recommended CT angiogram of his aorta pelvis and runoff for further  definition of the level of disease. Will make further recommendations pending this. Certainly would  not recommend extensive surgical revascularization if he does have bilateral superficial femoral artery disease since he is having claudication only. I did also explain the importance of walking program. We will schedule outpatient CT scan and follow-up with me   Rosetta Posner, MD [facility] V[facility] Special[facility] of [facility] Tel [phone] Pager [phone]

## 2017-07-19 ENCOUNTER — Other Ambulatory Visit: Payer: Self-pay

## 2017-07-19 ENCOUNTER — Telehealth: Payer: Self-pay | Admitting: Vascular Surgery

## 2017-07-19 DIAGNOSIS — Z01812 Encounter for preprocedural laboratory examination: Secondary | ICD-10-CM

## 2017-07-19 NOTE — Addendum Note (Signed)
Addended by: Lianne Cure A on: 07/19/2017 11:04 AM   Modules accepted: Orders

## 2017-07-19 NOTE — Telephone Encounter (Signed)
I scheduled this patient an appointment for CTA adb/pelvis w/ runoff at [facility] on 08/09/17 at 10am. He needs to arrive at 9:45am in the radiology department. No solid foods 4 hours prior but liquids and medications are okay. He is scheduled to see Dr.Early on 08/16/17 at 9:30am here at [facility]. The patient is aware of these appts and I also mailed a letter to him as well. awt

## 2017-07-26 ENCOUNTER — Other Ambulatory Visit: Payer: Self-pay

## 2017-07-26 DIAGNOSIS — Z01818 Encounter for other preprocedural examination: Secondary | ICD-10-CM

## 2017-08-09 ENCOUNTER — Ambulatory Visit (HOSPITAL_COMMUNITY): Admission: RE | Admit: 2017-08-09 | Payer: Medicare Other | Source: Ambulatory Visit

## 2017-08-16 ENCOUNTER — Ambulatory Visit: Payer: Medicare Other | Admitting: Vascular Surgery

## 2017-08-18 ENCOUNTER — Ambulatory Visit (HOSPITAL_COMMUNITY)
Admission: RE | Admit: 2017-08-18 | Discharge: 2017-08-18 | Disposition: A | Discharge status: Home / Self Care | Payer: Medicare Other | Source: Ambulatory Visit | Attending: Vascular Surgery | Admitting: Vascular Surgery

## 2017-08-18 DIAGNOSIS — I771 Stricture of artery: Secondary | ICD-10-CM | POA: Insufficient documentation

## 2017-08-18 DIAGNOSIS — I708 Atherosclerosis of other arteries: Secondary | ICD-10-CM | POA: Diagnosis not present

## 2017-08-18 DIAGNOSIS — I739 Peripheral vascular disease, unspecified: Secondary | ICD-10-CM | POA: Insufficient documentation

## 2017-08-18 DIAGNOSIS — I70213 Atherosclerosis of native arteries of extremities with intermittent claudication, bilateral legs: Secondary | ICD-10-CM | POA: Diagnosis not present

## 2017-08-18 LAB — POCT I-STAT CREATININE: Creatinine, Ser: 0.9 mg/dL (ref 0.61–1.24)

## 2017-08-18 MED ORDER — IOPAMIDOL (ISOVUE-370) INJECTION 76%
150.0000 mL | Freq: Once | INTRAVENOUS | Status: AC | PRN
Start: 2017-08-18 — End: 2017-08-18
  Administered 2017-08-18: 150 mL via INTRAVENOUS

## 2017-08-30 ENCOUNTER — Encounter: Payer: Self-pay | Admitting: Vascular Surgery

## 2017-08-30 ENCOUNTER — Other Ambulatory Visit: Payer: Self-pay | Admitting: *Deleted

## 2017-08-30 ENCOUNTER — Encounter: Payer: Self-pay | Admitting: *Deleted

## 2017-08-30 ENCOUNTER — Ambulatory Visit (INDEPENDENT_AMBULATORY_CARE_PROVIDER_SITE_OTHER): Payer: Medicare Other | Admitting: Vascular Surgery

## 2017-08-30 VITALS — BP 173/86 | HR 66 | Temp 97.2°F | Resp 16 | Ht 69.0 in | Wt 164.0 lb

## 2017-08-30 DIAGNOSIS — I739 Peripheral vascular disease, unspecified: Secondary | ICD-10-CM

## 2017-08-30 NOTE — Progress Notes (Signed)
Vitals:   08/30/17 0832 08/30/17 0837  BP: (!) 165/88 (!) 173/86  Pulse: 66 66  Resp: 16   Temp: (!) 97.2 F (36.2 C)   SpO2: 99%   Weight: 164 lb (74.4 kg)   Height: 5\' 9"  (1.753 m)

## 2017-08-30 NOTE — H&P (View-Only) (Signed)
Vascular and Vein Specialist of [place]  Patient name: [name] [name] MRN: [id number] DOB: 05-01-1951 Sex: male  REASON FOR VISIT: Follow-up for peripheral vascular occlusive disease and limiting claudication  HPI: [name] [name] is a 66 y.o. male here today for follow-up.  He had seen me initially for evaluation of limiting claudication both lower extremities.  Noninvasive studies revealed multilevel disease.  He underwent CT abdomen pelvis and runoff on 08/18/2017 and is here today for discussion of this.  He has no change in his symptoms.  He reports that his symptoms are equal right and left leg.  It is in his calves and can extend up into his thighs as well.  Does keep him from doing his usual activities.  Fortunately he has had no tissue loss and no arterial rest pain.  Past Medical History:  Diagnosis Date  . Back pain    from fall 16 yrs ago-had 4 fx vertebrae  . COPD (chronic obstructive pulmonary disease) ([place])   . Emphysema, unspecified ([place])   . Head injuries    16 yrs ago  . Hepatitis   . Hepatitis C    Treated in the past, states he is cured.   . Hypertension   . Seizures ([place])    last seizure  25 yrs ago.Epilepsy    Family History  Problem Relation Age of Onset  . Heart disease Father   . Anesthesia problems Neg Hx   . Hypotension Neg Hx   . Malignant hyperthermia Neg Hx   . Pseudochol deficiency Neg Hx   . Colon cancer Neg Hx     SOCIAL HISTORY: Social History  Substance Use Topics  . Smoking status: Current Every Day Smoker    Packs/day: 0.25    Years: 40.00    Types: Cigarettes  . Smokeless tobacco: Never Used  . Alcohol use 1.8 oz/week    2 Cans of beer, 1 Shots of liquor per week     Comment: occasionally     No Known Allergies  Current Outpatient Prescriptions  Medication Sig Dispense Refill  . amLODipine (NORVASC) 5 MG tablet Take 5 mg by mouth every evening.     Marland Kitchen aspirin EC 81 MG tablet Take 81 mg  by mouth daily.    Marland Kitchen atenolol (TENORMIN) 50 MG tablet Take 50 mg by mouth every evening.     . Cholecalciferol (VITAMIN D3) 1000 units CAPS Take 1,000 Units by mouth daily.     . divalproex (DEPAKOTE) 500 MG EC tablet Take 500 mg by mouth 2 (two) times daily.      Marland Kitchen FLUZONE HIGH-DOSE 0.5 ML injection TO BE ADMINISTERED BY PHARMACIST FOR IMMUNIZATION  0  . Magnesium 250 MG TABS Take 250 mg by mouth daily.     . Omega-3 Krill Oil 1000 MG CAPS Take 1 capsule by mouth 2 (two) times daily.    Marland Kitchen oxyCODONE (OXY IR/ROXICODONE) 5 MG immediate release tablet Take 0.5-2 tablets (2.5-10 mg total) by mouth every 6 (six) hours as needed for severe pain or breakthrough pain. 40 tablet 0   No current facility-administered medications for this visit.     REVIEW OF SYSTEMS:  [X]  denotes positive finding, [ ]  denotes negative finding Cardiac  Comments:  Chest pain or chest pressure:    Shortness of breath upon exertion:    Short of breath when lying flat:    Irregular heart rhythm:        Vascular    Pain in  calf, thigh, or hip brought on by ambulation: x   Pain in feet at night that wakes you up from your sleep:     Blood clot in your veins:    Leg swelling:           PHYSICAL EXAM: Vitals:   08/30/17 0832 08/30/17 0837  BP: (!) 165/88 (!) 173/86  Pulse: 66 66  Resp: 16   Temp: (!) 97.2 F (36.2 C)   SpO2: 99%   Weight: 164 lb (74.4 kg)   Height: 5\' 9"  (1.753 m)     GENERAL: The patient is a well-nourished male, in no acute distress. The vital signs are documented above. CARDIOVASCULAR: 2+ radial pulses bilaterally PULMONARY: There is good air exchange  MUSCULOSKELETAL: There are no major deformities or cyanosis. NEUROLOGIC: No focal weakness or paresthesias are detected. SKIN: There are no ulcers or rashes noted. PSYCHIATRIC: The patient has a normal affect.  DATA:  I reviewed his CT images and discussed these at length with the patient.  This shows diffuse iliac disease and  high-grade common femoral artery and proximal SFA disease bilaterally.  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  I recommended arteriography for further evaluation.  Explained that this may be a correctable with femoral endarterectomy bilaterally or possible iliac intervention as well.  We will proceed with his arteriogram at his earliest convenience and understands that he will undergo intervention if indicated as well.  He does not have family in the area and does not have transportation home.  Therefore will have a 23-hour observation following his arteriogram and possible intervention    Rosetta Posner, MD [facility] Vascular and Vein Specialists of [facility] Tel [phone] Pager [phone]

## 2017-08-30 NOTE — Progress Notes (Signed)
Vascular and Vein Specialist of [place]  Patient name: ABSHIR PAOLINI MRN: [id number] DOB: 01-14-1951 Sex: male  REASON FOR VISIT: Follow-up for peripheral vascular occlusive disease and limiting claudication  HPI: [name] [name] is a 66 y.o. male here today for follow-up.  He had seen me initially for evaluation of limiting claudication both lower extremities.  Noninvasive studies revealed multilevel disease.  He underwent CT abdomen pelvis and runoff on 08/18/2017 and is here today for discussion of this.  He has no change in his symptoms.  He reports that his symptoms are equal right and left leg.  It is in his calves and can extend up into his thighs as well.  Does keep him from doing his usual activities.  Fortunately he has had no tissue loss and no arterial rest pain.  Past Medical History:  Diagnosis Date  . Back pain    from fall 16 yrs ago-had 4 fx vertebrae  . COPD (chronic obstructive pulmonary disease) ([place])   . Emphysema, unspecified ([place])   . Head injuries    16 yrs ago  . Hepatitis   . Hepatitis C    Treated in the past, states he is cured.   . Hypertension   . Seizures ([place])    last seizure  25 yrs ago.Epilepsy    Family History  Problem Relation Age of Onset  . Heart disease Father   . Anesthesia problems Neg Hx   . Hypotension Neg Hx   . Malignant hyperthermia Neg Hx   . Pseudochol deficiency Neg Hx   . Colon cancer Neg Hx     SOCIAL HISTORY: Social History  Substance Use Topics  . Smoking status: Current Every Day Smoker    Packs/day: 0.25    Years: 40.00    Types: Cigarettes  . Smokeless tobacco: Never Used  . Alcohol use 1.8 oz/week    2 Cans of beer, 1 Shots of liquor per week     Comment: occasionally     No Known Allergies  Current Outpatient Prescriptions  Medication Sig Dispense Refill  . amLODipine (NORVASC) 5 MG tablet Take 5 mg by mouth every evening.     Marland Kitchen aspirin EC 81 MG tablet Take 81 mg  by mouth daily.    Marland Kitchen atenolol (TENORMIN) 50 MG tablet Take 50 mg by mouth every evening.     . Cholecalciferol (VITAMIN D3) 1000 units CAPS Take 1,000 Units by mouth daily.     . divalproex (DEPAKOTE) 500 MG EC tablet Take 500 mg by mouth 2 (two) times daily.      Marland Kitchen FLUZONE HIGH-DOSE 0.5 ML injection TO BE ADMINISTERED BY PHARMACIST FOR IMMUNIZATION  0  . Magnesium 250 MG TABS Take 250 mg by mouth daily.     . Omega-3 Krill Oil 1000 MG CAPS Take 1 capsule by mouth 2 (two) times daily.    Marland Kitchen oxyCODONE (OXY IR/ROXICODONE) 5 MG immediate release tablet Take 0.5-2 tablets (2.5-10 mg total) by mouth every 6 (six) hours as needed for severe pain or breakthrough pain. 40 tablet 0   No current facility-administered medications for this visit.     REVIEW OF SYSTEMS:  [X]  denotes positive finding, [ ]  denotes negative finding Cardiac  Comments:  Chest pain or chest pressure:    Shortness of breath upon exertion:    Short of breath when lying flat:    Irregular heart rhythm:        Vascular    Pain in  calf, thigh, or hip brought on by ambulation: x   Pain in feet at night that wakes you up from your sleep:     Blood clot in your veins:    Leg swelling:           PHYSICAL EXAM: Vitals:   08/30/17 0832 08/30/17 0837  BP: (!) 165/88 (!) 173/86  Pulse: 66 66  Resp: 16   Temp: (!) 97.2 F (36.2 C)   SpO2: 99%   Weight: 164 lb (74.4 kg)   Height: 5\' 9"  (1.753 m)     GENERAL: The patient is a well-nourished male, in no acute distress. The vital signs are documented above. CARDIOVASCULAR: 2+ radial pulses bilaterally PULMONARY: There is good air exchange  MUSCULOSKELETAL: There are no major deformities or cyanosis. NEUROLOGIC: No focal weakness or paresthesias are detected. SKIN: There are no ulcers or rashes noted. PSYCHIATRIC: The patient has a normal affect.  DATA:  I reviewed his CT images and discussed these at length with the patient.  This shows diffuse iliac disease and  high-grade common femoral artery and proximal SFA disease bilaterally.  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  I recommended arteriography for further evaluation.  Explained that this may be a correctable with femoral endarterectomy bilaterally or possible iliac intervention as well.  We will proceed with his arteriogram at his earliest convenience and understands that he will undergo intervention if indicated as well.  He does not have family in the area and does not have transportation home.  Therefore will have a 23-hour observation following his arteriogram and possible intervention    Rosetta Posner, MD [facility] Vascular and Vein Specialists of [facility] Tel [phone] Pager [phone]

## 2017-09-06 ENCOUNTER — Ambulatory Visit (HOSPITAL_COMMUNITY)
Admission: RE | Admit: 2017-09-06 | Discharge: 2017-09-07 | Disposition: A | Discharge status: Home / Self Care | Payer: Medicare Other | Source: Ambulatory Visit | Attending: Vascular Surgery | Admitting: Vascular Surgery

## 2017-09-06 ENCOUNTER — Encounter (HOSPITAL_COMMUNITY): Payer: Self-pay | Admitting: General Practice

## 2017-09-06 ENCOUNTER — Other Ambulatory Visit: Payer: Self-pay

## 2017-09-06 ENCOUNTER — Encounter (HOSPITAL_COMMUNITY)
Admission: RE | Disposition: A | Discharge status: Home / Self Care | Payer: Self-pay | Source: Ambulatory Visit | Attending: Vascular Surgery

## 2017-09-06 DIAGNOSIS — J449 Chronic obstructive pulmonary disease, unspecified: Secondary | ICD-10-CM | POA: Insufficient documentation

## 2017-09-06 DIAGNOSIS — Z7982 Long term (current) use of aspirin: Secondary | ICD-10-CM | POA: Insufficient documentation

## 2017-09-06 DIAGNOSIS — B1920 Unspecified viral hepatitis C without hepatic coma: Secondary | ICD-10-CM | POA: Diagnosis not present

## 2017-09-06 DIAGNOSIS — F17210 Nicotine dependence, cigarettes, uncomplicated: Secondary | ICD-10-CM | POA: Diagnosis not present

## 2017-09-06 DIAGNOSIS — I700 Atherosclerosis of aorta: Secondary | ICD-10-CM | POA: Insufficient documentation

## 2017-09-06 DIAGNOSIS — I70213 Atherosclerosis of native arteries of extremities with intermittent claudication, bilateral legs: Secondary | ICD-10-CM | POA: Insufficient documentation

## 2017-09-06 DIAGNOSIS — I739 Peripheral vascular disease, unspecified: Secondary | ICD-10-CM | POA: Diagnosis present

## 2017-09-06 DIAGNOSIS — I10 Essential (primary) hypertension: Secondary | ICD-10-CM | POA: Insufficient documentation

## 2017-09-06 DIAGNOSIS — G40909 Epilepsy, unspecified, not intractable, without status epilepticus: Secondary | ICD-10-CM | POA: Diagnosis not present

## 2017-09-06 HISTORY — PX: ABDOMINAL AORTOGRAM: CATH118222

## 2017-09-06 HISTORY — DX: Gastro-esophageal reflux disease without esophagitis: K21.9

## 2017-09-06 HISTORY — DX: Epilepsy, unspecified, not intractable, without status epilepticus: G40.909

## 2017-09-06 HISTORY — DX: Pneumonia, unspecified organism: J18.9

## 2017-09-06 HISTORY — DX: Other amnesia: R41.3

## 2017-09-06 HISTORY — DX: Unspecified injury of head, initial encounter: S09.90XA

## 2017-09-06 HISTORY — PX: LOWER EXTREMITY ANGIOGRAPHY: CATH118251

## 2017-09-06 HISTORY — PX: ABDOMINAL AORTAGRAM: SHX5706

## 2017-09-06 LAB — POCT I-STAT, CHEM 8
BUN: 12 mg/dL (ref 6–20)
CHLORIDE: 105 mmol/L (ref 101–111)
CREATININE: 0.6 mg/dL — AB (ref 0.61–1.24)
Calcium, Ion: 1.2 mmol/L (ref 1.15–1.40)
Glucose, Bld: 118 mg/dL — ABNORMAL HIGH (ref 65–99)
HEMATOCRIT: 44 % (ref 39.0–52.0)
Hemoglobin: 15 g/dL (ref 13.0–17.0)
POTASSIUM: 3.9 mmol/L (ref 3.5–5.1)
SODIUM: 141 mmol/L (ref 135–145)
TCO2: 27 mmol/L (ref 22–32)

## 2017-09-06 SURGERY — ABDOMINAL AORTOGRAM
Anesthesia: LOCAL

## 2017-09-06 MED ORDER — MIDAZOLAM HCL 2 MG/2ML IJ SOLN
INTRAMUSCULAR | Status: DC | PRN
  Administered 2017-09-06 (×2): 1 mg via INTRAVENOUS

## 2017-09-06 MED ORDER — ONDANSETRON HCL 4 MG/2ML IJ SOLN: 4.0000 mg | Freq: Four times a day (QID) | INTRAMUSCULAR | Status: DC | PRN

## 2017-09-06 MED ORDER — MIDAZOLAM HCL 2 MG/2ML IJ SOLN
INTRAMUSCULAR | Status: AC
  Filled 2017-09-06: qty 2

## 2017-09-06 MED ORDER — MAGNESIUM OXIDE 400 (241.3 MG) MG PO TABS
200.0000 mg | ORAL_TABLET | Freq: Every day | ORAL | Status: DC
  Administered 2017-09-06: 200 mg via ORAL
  Filled 2017-09-06 (×2): qty 1

## 2017-09-06 MED ORDER — VITAMIN D 1000 UNITS PO TABS
1000.0000 [IU] | ORAL_TABLET | Freq: Every day | ORAL | Status: DC
  Filled 2017-09-06 (×3): qty 1

## 2017-09-06 MED ORDER — OXYCODONE HCL 5 MG PO TABS: 2.5000 mg | ORAL_TABLET | Freq: Four times a day (QID) | ORAL | Status: DC | PRN

## 2017-09-06 MED ORDER — SODIUM CHLORIDE 0.9 % IV SOLN: 250.0000 mL | INTRAVENOUS | Status: DC | PRN

## 2017-09-06 MED ORDER — DIVALPROEX SODIUM 500 MG PO DR TAB
500.0000 mg | DELAYED_RELEASE_TABLET | Freq: Two times a day (BID) | ORAL | Status: DC
  Administered 2017-09-06: 500 mg via ORAL
  Filled 2017-09-06 (×2): qty 1

## 2017-09-06 MED ORDER — SODIUM CHLORIDE 0.9 % IV SOLN
INTRAVENOUS | Status: DC
  Administered 2017-09-06: 08:00:00 via INTRAVENOUS

## 2017-09-06 MED ORDER — FENTANYL CITRATE (PF) 100 MCG/2ML IJ SOLN
INTRAMUSCULAR | Status: AC
  Filled 2017-09-06: qty 2

## 2017-09-06 MED ORDER — LABETALOL HCL 5 MG/ML IV SOLN: 10.0000 mg | INTRAVENOUS | Status: DC | PRN

## 2017-09-06 MED ORDER — VITAMIN B-12 100 MCG PO TABS
50.0000 ug | ORAL_TABLET | Freq: Every day | ORAL | Status: DC
  Filled 2017-09-06 (×2): qty 1

## 2017-09-06 MED ORDER — HYDRALAZINE HCL 20 MG/ML IJ SOLN: 5.0000 mg | INTRAMUSCULAR | Status: DC | PRN

## 2017-09-06 MED ORDER — HEPARIN (PORCINE) IN NACL 2-0.9 UNIT/ML-% IJ SOLN
INTRAMUSCULAR | Status: AC
  Filled 2017-09-06: qty 1000

## 2017-09-06 MED ORDER — LIDOCAINE HCL (PF) 1 % IJ SOLN
INTRAMUSCULAR | Status: DC | PRN
  Administered 2017-09-06: 15 mL

## 2017-09-06 MED ORDER — SODIUM CHLORIDE 0.9% FLUSH
3.0000 mL | Freq: Two times a day (BID) | INTRAVENOUS | Status: DC
  Administered 2017-09-07: 3 mL via INTRAVENOUS

## 2017-09-06 MED ORDER — FENTANYL CITRATE (PF) 100 MCG/2ML IJ SOLN
INTRAMUSCULAR | Status: DC | PRN
  Administered 2017-09-06 (×2): 25 ug via INTRAVENOUS

## 2017-09-06 MED ORDER — ACETAMINOPHEN 325 MG PO TABS: 650.0000 mg | ORAL_TABLET | ORAL | Status: DC | PRN

## 2017-09-06 MED ORDER — SODIUM CHLORIDE 0.9% FLUSH: 3.0000 mL | INTRAVENOUS | Status: DC | PRN

## 2017-09-06 MED ORDER — ATENOLOL 25 MG PO TABS
50.0000 mg | ORAL_TABLET | Freq: Every evening | ORAL | Status: DC
  Administered 2017-09-06: 50 mg via ORAL
  Filled 2017-09-06 (×2): qty 2

## 2017-09-06 MED ORDER — OMEGA-3 KRILL OIL 1000 MG PO CAPS: 1.0000 | ORAL_CAPSULE | Freq: Two times a day (BID) | ORAL | Status: DC

## 2017-09-06 MED ORDER — SODIUM CHLORIDE 0.9 % WEIGHT BASED INFUSION: 1.0000 mL/kg/h | INTRAVENOUS | Status: AC

## 2017-09-06 MED ORDER — LIDOCAINE HCL (PF) 1 % IJ SOLN
INTRAMUSCULAR | Status: AC
  Filled 2017-09-06: qty 30

## 2017-09-06 MED ORDER — COENZYME Q10 60 MG PO CAPS: ORAL_CAPSULE | Freq: Every day | ORAL | Status: DC

## 2017-09-06 MED ORDER — AMLODIPINE BESYLATE 5 MG PO TABS
5.0000 mg | ORAL_TABLET | Freq: Every evening | ORAL | Status: DC
  Administered 2017-09-06: 5 mg via ORAL
  Filled 2017-09-06 (×2): qty 1

## 2017-09-06 MED ORDER — IODIXANOL 320 MG/ML IV SOLN
INTRAVENOUS | Status: DC | PRN
  Administered 2017-09-06: 125 mL via INTRA_ARTERIAL

## 2017-09-06 MED ORDER — HEPARIN (PORCINE) IN NACL 2-0.9 UNIT/ML-% IJ SOLN
INTRAMUSCULAR | Status: AC | PRN
  Administered 2017-09-06: 1000 mL

## 2017-09-06 MED ORDER — ASPIRIN EC 81 MG PO TBEC
81.0000 mg | DELAYED_RELEASE_TABLET | Freq: Every day | ORAL | Status: DC
  Filled 2017-09-06: qty 1

## 2017-09-06 SURGICAL SUPPLY — 15 items
CATH ANGIO 5F BER2 65CM (CATHETERS) ×3 IMPLANT
CATH OMNI FLUSH 5F 65CM (CATHETERS) ×3 IMPLANT
COVER PRB 48X5XTLSCP FOLD TPE (BAG) ×2 IMPLANT
COVER PROBE 5X48 (BAG) ×1
DEVICE TORQUE H2O (MISCELLANEOUS) ×3 IMPLANT
GUIDEWIRE ANGLED .035X260CM (WIRE) ×3 IMPLANT
KIT MICROINTRODUCER STIFF 5F (SHEATH) ×3 IMPLANT
KIT PV (KITS) ×3 IMPLANT
SHEATH PINNACLE 5F 10CM (SHEATH) ×3 IMPLANT
SHIELD RADPAD SCOOP 12X17 (MISCELLANEOUS) ×3 IMPLANT
SYR MEDRAD MARK V 150ML (SYRINGE) ×3 IMPLANT
TRANSDUCER W/STOPCOCK (MISCELLANEOUS) ×3 IMPLANT
TRAY PV CATH (CUSTOM PROCEDURE TRAY) ×3 IMPLANT
WIRE BENTSON .035X145CM (WIRE) ×3 IMPLANT
WIRE ROSEN-J .035X260CM (WIRE) ×3 IMPLANT

## 2017-09-06 NOTE — Op Note (Signed)
    Patient name: [name] [name] MRN: [id number] DOB: 10-Dec-1950 Sex: male  09/06/2017 Pre-operative Diagnosis: Bilateral claudication Post-operative diagnosis:  Same Surgeon:  [name] Procedure Performed:  1.  Ultrasound-guided access, right femoral artery  2.  Abdominal aortogram  3.  Bilateral lower extremity runoff  4.  Conscious sedation (52minutes)     Indications:  The patient suffers from bilateral claudication, right equals left.  He is here today for further evaluation  Procedure:  The patient was identified in the holding area and taken to room 8.  The patient was then placed supine on the table and prepped and draped in the usual sterile fashion.  A time out was called.  Conscious sedation was administered with the use of IV fentanyl and Versed under continuous physician and nurse monitoring.  Heart rate, blood pressure, and oxygen saturation were continuously monitored.  Ultrasound was used to evaluate the right common femoral artery.  It was patent .  A digital ultrasound image was acquired.  A micropuncture needle was used to access the right common femoral artery under ultrasound guidance.  An 018 wire was advanced without resistance and a micropuncture sheath was placed.  The 018 wire was removed and a benson wire was placed.  The micropuncture sheath was exchanged for a 5 french sheath.  I used a Glidewire and a Berenstein catheter in order to navigate the wire into the aorta.  The catheter was advanced over the wire.  A Rosen wire was then inserted and an omni-flush catheter was positioned at the level of L-1 for an abdominal aortogram.  The catheter was then pulled down to the aortic bifurcation and bilateral lower sternum a runoff was performed  Findings:   Aortogram:  No significant renal artery stenosis.  The infrarenal abdominal aorta is heavily calcified, with islands of plaque.  Bilateral common and external iliac arteries are heavily calcified with several areas  of greater than 80% stenosis  bilaterally  Right Lower Extremity:  There is a 90% stenosis in the right common femoral artery which is heavily calcified.  The profunda and superficial femoral artery are patent throughout the course as is the popliteal artery with three-vessel runoff  Left Lower Extremity:  The left common femoral artery is occluded.  He reconstitutes at the bifurcation.  The profunda and superficial femoral artery are patent throughout the course as is the popliteal artery with three-vessel runoff  Intervention:  No good options for sustainable percutaneous intervention  Impression:  #1  heavily calcified infrarenal abdominal aorta  #2  heavily calcified bilateral common and external iliac arteries with several areas of high-grade stenosis.  #3  90% stenosis within the right femoral artery.  #4  occluded left common femoral artery     V. [name], M.D. V[facility] Vein Specialists of [place] Office: [phone] Pager:  [phone]

## 2017-09-06 NOTE — Progress Notes (Signed)
Pt arrived to 4e from cath lab. Vitals stable. Pt oriented to room and staff. Pt denies needs. Will continue current plan of care.   [name] Fontana BSN, RN

## 2017-09-06 NOTE — Interval H&P Note (Signed)
History and Physical Interval Note:  09/06/2017 10:33 AM  [name] [name]  has presented today for surgery, with the diagnosis of pad  The various methods of treatment have been discussed with the patient and family. After consideration of risks, benefits and other options for treatment, the patient has consented to  Procedure(s): ABDOMINAL AORTOGRAM (N/A) as a surgical intervention .  The patient's history has been reviewed, patient examined, no change in status, stable for surgery.  I have reviewed the patient's chart and labs.  Questions were answered to the patient's satisfaction.     [name]

## 2017-09-06 NOTE — Progress Notes (Signed)
Upper denture and lower partial

## 2017-09-07 ENCOUNTER — Encounter (HOSPITAL_COMMUNITY): Payer: Self-pay | Admitting: Surgery

## 2017-09-07 DIAGNOSIS — I10 Essential (primary) hypertension: Secondary | ICD-10-CM | POA: Diagnosis not present

## 2017-09-07 DIAGNOSIS — Z7982 Long term (current) use of aspirin: Secondary | ICD-10-CM | POA: Diagnosis not present

## 2017-09-07 DIAGNOSIS — I700 Atherosclerosis of aorta: Secondary | ICD-10-CM | POA: Diagnosis not present

## 2017-09-07 DIAGNOSIS — J449 Chronic obstructive pulmonary disease, unspecified: Secondary | ICD-10-CM | POA: Diagnosis not present

## 2017-09-07 DIAGNOSIS — I70213 Atherosclerosis of native arteries of extremities with intermittent claudication, bilateral legs: Secondary | ICD-10-CM | POA: Diagnosis not present

## 2017-09-07 NOTE — Progress Notes (Signed)
Patient ID: [name] [name], male   DOB: 1951-03-12, 66 y.o.   MRN: [id number] Discussed arteriogram with the patient prior to his discharge.  He has complete occlusion of his left common femoral artery and subtotal occlusion of his right common femoral artery.  Has extensive calcification of his aorta with no flow-limiting has high-grade stenoses and very calcified plaque in both iliac arteries.  He is unable to tolerate his current level of severe claudication due to multilevel disease.  I did explain the option of aortobifemoral bypass grafting with bilateral femoral endarterectomies with excellent long-term durability.  Also explained the option of bilateral femoral endarterectomies and angioplasty of his iliacs.  I am concerned that this would not give durable result due to the diffuse extensive nature of his iliac stenoses.  We will obtain cardiac clearance and then proceed with aortobifemoral bypass grafting.  I explained the procedure including the magnitude of the procedure and expected 1-2-day intensive care unit in 5-7-day hospitalization.

## 2017-09-07 NOTE — H&P (View-Only) (Signed)
Patient ID: [name] [name], male   DOB: [birth year], 66 y.o.   MRN: [id number] Discussed arteriogram with the patient prior to his discharge.  He has complete occlusion of his left common femoral artery and subtotal occlusion of his right common femoral artery.  Has extensive calcification of his aorta with no flow-limiting has high-grade stenoses and very calcified plaque in both iliac arteries.  He is unable to tolerate his current level of severe claudication due to multilevel disease.  I did explain the option of aortobifemoral bypass grafting with bilateral femoral endarterectomies with excellent long-term durability.  Also explained the option of bilateral femoral endarterectomies and angioplasty of his iliacs.  I am concerned that this would not give durable result due to the diffuse extensive nature of his iliac stenoses.  We will obtain cardiac clearance and then proceed with aortobifemoral bypass grafting.  I explained the procedure including the magnitude of the procedure and expected 1-2-day intensive care unit in 5-7-day hospitalization.

## 2017-09-07 NOTE — Progress Notes (Signed)
[facility]  Subjective  - Doing well and wants to go home soon.   Objective 128/72 83 98.2 F (36.8 C) (Oral) 16 96%  Intake/Output Summary (Last 24 hours) at 09/07/2017 0806 Last data filed at 09/07/2017 0400 Gross per 24 hour  Intake 1000 ml  Output -  Net 1000 ml    Right groin soft without hematoma No LE ulcers, skin warm to touch  Assessment/Planning: Angiogram  Impression:             #1  heavily calcified infrarenal abdominal aorta             #2  heavily calcified bilateral common and external iliac arteries with several areas of high-grade stenosis.             #3  90% stenosis within the right femoral artery.             #4  occluded left common femoral artery   Dr. Donnetta Hutching will discuss options and plan follow up visit based on their discussion this am. Disposition stable  [facility] 09/07/2017 8:06 AM --  Laboratory Lab Results: Recent Labs    09/06/17 0806  HGB 15.0  HCT 44.0   BMET Recent Labs    09/06/17 0806  NA 141  K 3.9  CL 105  GLUCOSE 118*  BUN 12  CREATININE 0.60*    COAG No results found for: INR, PROTIME No results found for: PTT

## 2017-09-08 ENCOUNTER — Telehealth: Payer: Self-pay | Admitting: *Deleted

## 2017-09-08 ENCOUNTER — Other Ambulatory Visit: Payer: Self-pay | Admitting: *Deleted

## 2017-09-08 NOTE — Progress Notes (Signed)
Call to patient to inform him of surgery. To be at [facility] department on 09/30/17 10 am. NPO past MN. Plan to be admitted. Follow detailed instructions from the [facility] regarding this surgery. Call back if any questions or problems. Verbalized understanding.

## 2017-09-08 NOTE — Telephone Encounter (Signed)
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 Sherren Mocha Early has requested Cardiac Clearance for this patient. He will be scheduled for Aorto-bifemoral bypass graft when cleared for surgery> Please contact me if I can be of any assistance. Appreciate you seeing this patient. Gomez Cleverly RN @[facility]

## 2017-09-15 DIAGNOSIS — I739 Peripheral vascular disease, unspecified: Secondary | ICD-10-CM | POA: Diagnosis not present

## 2017-09-15 DIAGNOSIS — J449 Chronic obstructive pulmonary disease, unspecified: Secondary | ICD-10-CM | POA: Diagnosis not present

## 2017-09-15 DIAGNOSIS — I10 Essential (primary) hypertension: Secondary | ICD-10-CM | POA: Diagnosis not present

## 2017-09-20 ENCOUNTER — Ambulatory Visit (HOSPITAL_COMMUNITY)
Admission: RE | Admit: 2017-09-20 | Discharge: 2017-09-20 | Disposition: A | Discharge status: Home / Self Care | Payer: Medicare Other | Source: Ambulatory Visit | Attending: Pulmonary Disease | Admitting: Pulmonary Disease

## 2017-09-20 ENCOUNTER — Other Ambulatory Visit (HOSPITAL_COMMUNITY): Payer: Self-pay | Admitting: Pulmonary Disease

## 2017-09-20 ENCOUNTER — Telehealth: Payer: Self-pay | Admitting: *Deleted

## 2017-09-20 DIAGNOSIS — M79671 Pain in right foot: Secondary | ICD-10-CM

## 2017-09-20 DIAGNOSIS — X58XXXA Exposure to other specified factors, initial encounter: Secondary | ICD-10-CM | POA: Diagnosis not present

## 2017-09-20 DIAGNOSIS — S99921A Unspecified injury of right foot, initial encounter: Secondary | ICD-10-CM | POA: Diagnosis not present

## 2017-09-20 DIAGNOSIS — M19071 Primary osteoarthritis, right ankle and foot: Secondary | ICD-10-CM | POA: Insufficient documentation

## 2017-09-20 DIAGNOSIS — S92311A Displaced fracture of first metatarsal bone, right foot, initial encounter for closed fracture: Secondary | ICD-10-CM | POA: Insufficient documentation

## 2017-09-20 NOTE — Telephone Encounter (Signed)
Patient called stated he "twisted ankle. Very swollen and painful worried about able to have pending surgery" He has not seen any medical professional about this. Ice, elevation and ace wrap and instructed to see PCP or whoever they recommend.He stated he was at his PCP on 11/19/16 but did not mention his ankle injury.

## 2017-09-21 ENCOUNTER — Telehealth: Payer: Self-pay | Admitting: *Deleted

## 2017-09-21 NOTE — Telephone Encounter (Signed)
Call from patient this am that he followed advice of ice, elevation, ace wrap and Ibuprofen for pain and" foot is much better". He did get foot x-rayed yesterday via PCP who he states is to call him today.

## 2017-09-23 NOTE — Pre-Procedure Instructions (Signed)
[name] [name]  09/23/2017      [id number] - [place], [place] [address] Phone: [phone] Fax: [phone]    Your procedure is scheduled on September 30, 2017.  Report to [facility] Admitting at 1000 AM.  Call this number if you have problems the morning of surgery:  [phone]   Remember:  Do not eat food or drink liquids after midnight.  Take these medicines the morning of surgery with A SIP OF WATER atenolol (tenormin), amlodipine (norvasc), divalproex (depakote), oxycodone-if needed for pain.   7 days prior to surgery STOP taking any Aspirin (unless otherwise instructed by your surgeon), Aleve, Naproxen, Ibuprofen, Motrin, Advil, Goody's, BC's, all herbal medications, fish oil, and all vitamins  Continue all other medications as instructed by your physician except follow the above medication instructions before surgery   Do not wear jewelry, make-up or nail polish.  Do not wear lotions, powders, or colognes, or deodorant.  Men may shave face and neck.  Do not bring valuables to the hospital.  [facility] is not responsible for any belongings or valuables.  Contacts, dentures or bridgework may not be worn into surgery.  Leave your suitcase in the car.  After surgery it may be brought to your room.  For patients admitted to the hospital, discharge time will be determined by your treatment team.  Patients discharged the day of surgery will not be allowed to drive home.   Special instructions:   [place]- Preparing For Surgery  Before surgery, you can play an important role. Because skin is not sterile, your skin needs to be as free of germs as possible. You can reduce the number of germs on your skin by washing with CHG (chlorahexidine gluconate) Soap before surgery.  CHG is an antiseptic cleaner which kills germs and bonds with the skin to continue killing germs even after  washing.  Please do not use if you have an allergy to CHG or antibacterial soaps. If your skin becomes reddened/irritated stop using the CHG.  Do not shave (including legs and underarms) for at least 48 hours prior to first CHG shower. It is OK to shave your face.  Please follow these instructions carefully.   1. Shower the NIGHT BEFORE SURGERY and the MORNING OF SURGERY with CHG.   2. If you chose to wash your hair, wash your hair first as usual with your normal shampoo.  3. After you shampoo, rinse your hair and body thoroughly to remove the shampoo.  4. Use CHG as you would any other liquid soap. You can apply CHG directly to the skin and wash gently with a scrungie or a clean washcloth.   5. Apply the CHG Soap to your body ONLY FROM THE NECK DOWN.  Do not use on open wounds or open sores. Avoid contact with your eyes, ears, mouth and genitals (private parts). Wash Face and genitals (private parts)  with your normal soap.  6. Wash thoroughly, paying special attention to the area where your surgery will be performed.  7. Thoroughly rinse your body with warm water from the neck down.  8. DO NOT shower/wash with your normal soap after using and rinsing off the CHG Soap.  9. Pat yourself dry with a CLEAN TOWEL.  10. Wear CLEAN PAJAMAS to bed the night before surgery, wear comfortable clothes the morning of surgery  11. Place CLEAN SHEETS on your bed the night  of your first shower and DO NOT SLEEP WITH PETS.    Day of Surgery: Do not apply any deodorants/lotions. Please wear clean clothes to the hospital/surgery center.     Please read over the following fact sheets that you were given. Pain Booklet, Coughing and Deep Breathing, MRSA Information and Surgical Site Infection Prevention

## 2017-09-26 ENCOUNTER — Other Ambulatory Visit: Payer: Self-pay

## 2017-09-26 ENCOUNTER — Encounter (HOSPITAL_COMMUNITY): Payer: Self-pay

## 2017-09-26 ENCOUNTER — Encounter (HOSPITAL_COMMUNITY)
Admission: RE | Admit: 2017-09-26 | Discharge: 2017-09-26 | Disposition: A | Discharge status: Home / Self Care | Payer: Medicare Other | Source: Ambulatory Visit | Attending: Vascular Surgery | Admitting: Vascular Surgery

## 2017-09-26 DIAGNOSIS — Z0183 Encounter for blood typing: Secondary | ICD-10-CM | POA: Diagnosis not present

## 2017-09-26 DIAGNOSIS — I70203 Unspecified atherosclerosis of native arteries of extremities, bilateral legs: Secondary | ICD-10-CM | POA: Insufficient documentation

## 2017-09-26 DIAGNOSIS — Z01812 Encounter for preprocedural laboratory examination: Secondary | ICD-10-CM | POA: Insufficient documentation

## 2017-09-26 HISTORY — DX: Dyspnea, unspecified: R06.00

## 2017-09-26 LAB — CBC
HCT: 42.2 % (ref 39.0–52.0)
Hemoglobin: 14.4 g/dL (ref 13.0–17.0)
MCH: 33.1 pg (ref 26.0–34.0)
MCHC: 34.1 g/dL (ref 30.0–36.0)
MCV: 97 fL (ref 78.0–100.0)
Platelets: 176 K/uL (ref 150–400)
RBC: 4.35 MIL/uL (ref 4.22–5.81)
RDW: 13.4 % (ref 11.5–15.5)
WBC: 6.3 K/uL (ref 4.0–10.5)

## 2017-09-26 LAB — URINALYSIS, ROUTINE W REFLEX MICROSCOPIC
Bilirubin Urine: NEGATIVE
GLUCOSE, UA: NEGATIVE mg/dL
Hgb urine dipstick: NEGATIVE
Ketones, ur: NEGATIVE mg/dL
LEUKOCYTES UA: NEGATIVE
NITRITE: NEGATIVE
PH: 7 (ref 5.0–8.0)
Protein, ur: NEGATIVE mg/dL
SPECIFIC GRAVITY, URINE: 1.012 (ref 1.005–1.030)

## 2017-09-26 LAB — BLOOD GAS, ARTERIAL
ACID-BASE EXCESS: 1 mmol/L (ref 0.0–2.0)
BICARBONATE: 24.8 mmol/L (ref 20.0–28.0)
Drawn by: 421801
FIO2: 21
O2 SAT: 96.5 %
PCO2 ART: 37.6 mmHg (ref 32.0–48.0)
PH ART: 7.434 (ref 7.350–7.450)
PO2 ART: 84.9 mmHg (ref 83.0–108.0)
Patient temperature: 98.6

## 2017-09-26 LAB — COMPREHENSIVE METABOLIC PANEL
ALT: 8 U/L — ABNORMAL LOW (ref 17–63)
AST: 19 U/L (ref 15–41)
Albumin: 3.8 g/dL (ref 3.5–5.0)
Alkaline Phosphatase: 39 U/L (ref 38–126)
Anion gap: 7 (ref 5–15)
BILIRUBIN TOTAL: 0.7 mg/dL (ref 0.3–1.2)
BUN: 15 mg/dL (ref 6–20)
CO2: 23 mmol/L (ref 22–32)
CREATININE: 0.75 mg/dL (ref 0.61–1.24)
Calcium: 9.3 mg/dL (ref 8.9–10.3)
Chloride: 104 mmol/L (ref 101–111)
Glucose, Bld: 90 mg/dL (ref 65–99)
POTASSIUM: 4.4 mmol/L (ref 3.5–5.1)
Sodium: 134 mmol/L — ABNORMAL LOW (ref 135–145)
TOTAL PROTEIN: 7 g/dL (ref 6.5–8.1)

## 2017-09-26 LAB — SURGICAL PCR SCREEN
MRSA, PCR: NEGATIVE
Staphylococcus aureus: NEGATIVE

## 2017-09-26 LAB — PROTIME-INR
INR: 1.04
PROTHROMBIN TIME: 13.5 s (ref 11.4–15.2)

## 2017-09-26 LAB — ABO/RH: ABO/RH(D): AB POS

## 2017-09-26 LAB — APTT: aPTT: 27 seconds (ref 24–36)

## 2017-09-26 NOTE — Pre-Procedure Instructions (Signed)
[name] [name]  09/26/2017      [id number] [place], [place] - [place] [address] Phone: [phone] Fax: [phone]    Your procedure is scheduled on Friday, November 30.   Report to [facility] Admitting at 10:00 A.M.                Your surgery or procedure is scheduled for 12:00 Noon  Call this number if you have problems the morning of surgery:   Remember:  Do not eat food or drink liquids after midnight Thursday, November 29.  Take these medicines the morning of surgery with A SIP OF WATER :  aspirin EC  amLODipine (NORVASC)  divalproex (DEPAKOTE)   May take Tylenol or Oxycodone if needed for pain.   STOP taking Aspirin , Aspirin Products (Goody Powder, Excedrin Migraine), Ibuprofen (Advil), Naproxen (Aleve), Vitamins and Herbal Products (ie Fish Oil).  Special Instructions:   [name]- Preparing For Surgery  Before surgery, you can play an important role. Because skin is not sterile, your skin needs to be as free of germs as possible. You can reduce the number of germs on your skin by washing with CHG (chlorahexidine gluconate) Soap before surgery.  CHG is an antiseptic cleaner which kills germs and bonds with the skin to continue killing germs even after washing.  Please do not use if you have an allergy to CHG or antibacterial soaps. If your skin becomes reddened/irritated stop using the CHG.  Do not shave (including legs and underarms) for at least 48 hours prior to first CHG shower. It is OK to shave your face.  Please follow these instructions carefully.   1. Shower the NIGHT BEFORE SURGERY and the MORNING OF SURGERY with CHG.   2. If you chose to wash your hair, wash your hair first as usual with your normal shampoo.  3. After you shampoo, rinse your hair and body thoroughly to remove the shampoo.              Wash your face and private area with the soap you use at home, then  rinse.  4.  Use CHG as you would any other liquid soap. You can apply CHG directly to the skin and wash gently with a scrungie or a clean washcloth.   5. Apply the CHG Soap to your body ONLY FROM THE NECK DOWN.  Do not use on open wounds or open sores. Avoid contact with your eyes, ears, mouth and genitals (private parts). Wash Face and genitals (private parts)  with your normal soap.  6. Wash thoroughly, paying special attention to the area where your surgery will be performed.  7. Thoroughly rinse your body with warm water from the neck down.  8. DO NOT shower/wash with your normal soap after using and rinsing off the CHG Soap.  9. Pat yourself dry with a CLEAN TOWEL.  10. Wear CLEAN PAJAMAS to bed the night before surgery, wear comfortable clothes the morning of surgery  11. Place CLEAN SHEETS on your bed the night of your first shower and DO NOT SLEEP WITH PETS.  Day of Surgery: Shower as abocve Do not apply any deodorants/lotions, powders and colognes. Please wear clean clothes to the hospital/surgery center.    Do not wear jewelry, make-up or nail polish.  Do not shave 48 hours prior to surgery.  Men may shave face and neck.  Do not bring valuables to  the hospital.  [facility] is not responsible for any belongings or valuables.  Contacts, dentures or bridgework may not be worn into surgery.  Leave your suitcase in the car.  After surgery it may be brought to your room.  For patients admitted to the hospital, discharge time will be determined by your treatment team.  Patients discharged the day of surgery will not be allowed to drive home.  Please read over the following fact sheets that you were given.

## 2017-09-26 NOTE — Progress Notes (Signed)
[name] [name] denies chest pain or shortness of breath at rest.  [name] [name] recently broke a bone in his foot and he reports that he is taking as many as 10 Ibuprofen in 1 day. I instructed the patient to stop taking the Ibuprofen now, patient reported that Dr Luther Parody nurse told him to take it.  I called Dr Luther Parody office and spoke to [place].  Jacqlyn Larsen said he could take it and call PCP to have it evaluated.  I put the phone on speaker and Becky instructed patient to stop taking Ibuprofen and take Tylenol instead.  Becky also asked [name] [name] to call his PCP in am and ask if he should have any other treatment for his foot.  Patient said ok.

## 2017-09-27 ENCOUNTER — Telehealth: Payer: Self-pay | Admitting: *Deleted

## 2017-09-27 NOTE — Telephone Encounter (Signed)
Patient called  To inform me he has a call in to his PCP about any further treatment needed for his foot. Which I had asked him to do last week.He has been wearing an ace wrap ice and elevation,and has been taking Ibuprofen. He stated that his PCP told him to keep doing what he was doing when he gave him the x-ray report last week.  I had asked him Monday(09/26/17)  to stop Ibuprofen  and take tylenol only due to pending surgery. Informed him of time change for surgery on Friday to be at the hospital at [place]. Patient verbalized understanding.

## 2017-09-30 ENCOUNTER — Inpatient Hospital Stay (HOSPITAL_COMMUNITY): Payer: Medicare Other

## 2017-09-30 ENCOUNTER — Inpatient Hospital Stay (HOSPITAL_COMMUNITY): Payer: Medicare Other | Admitting: Certified Registered Nurse Anesthetist

## 2017-09-30 ENCOUNTER — Inpatient Hospital Stay (HOSPITAL_COMMUNITY)
Admission: RE | Admit: 2017-09-30 | Discharge: 2017-10-05 | DRG: 269 | Disposition: A | Discharge status: Skilled Nursing Facility | Payer: Medicare Other | Source: Ambulatory Visit | Attending: Vascular Surgery | Admitting: Vascular Surgery

## 2017-09-30 ENCOUNTER — Encounter (HOSPITAL_COMMUNITY)
Admission: RE | Disposition: A | Discharge status: Skilled Nursing Facility | Payer: Self-pay | Source: Ambulatory Visit | Attending: Vascular Surgery

## 2017-09-30 DIAGNOSIS — K219 Gastro-esophageal reflux disease without esophagitis: Secondary | ICD-10-CM | POA: Diagnosis not present

## 2017-09-30 DIAGNOSIS — I739 Peripheral vascular disease, unspecified: Secondary | ICD-10-CM | POA: Diagnosis not present

## 2017-09-30 DIAGNOSIS — G40909 Epilepsy, unspecified, not intractable, without status epilepticus: Secondary | ICD-10-CM | POA: Diagnosis present

## 2017-09-30 DIAGNOSIS — F17210 Nicotine dependence, cigarettes, uncomplicated: Secondary | ICD-10-CM | POA: Diagnosis present

## 2017-09-30 DIAGNOSIS — I10 Essential (primary) hypertension: Secondary | ICD-10-CM | POA: Diagnosis present

## 2017-09-30 DIAGNOSIS — Z87820 Personal history of traumatic brain injury: Secondary | ICD-10-CM

## 2017-09-30 DIAGNOSIS — J449 Chronic obstructive pulmonary disease, unspecified: Secondary | ICD-10-CM | POA: Diagnosis not present

## 2017-09-30 DIAGNOSIS — Z452 Encounter for adjustment and management of vascular access device: Secondary | ICD-10-CM | POA: Diagnosis not present

## 2017-09-30 DIAGNOSIS — I70213 Atherosclerosis of native arteries of extremities with intermittent claudication, bilateral legs: Secondary | ICD-10-CM | POA: Diagnosis not present

## 2017-09-30 DIAGNOSIS — J439 Emphysema, unspecified: Secondary | ICD-10-CM | POA: Diagnosis present

## 2017-09-30 DIAGNOSIS — Z9889 Other specified postprocedural states: Secondary | ICD-10-CM

## 2017-09-30 DIAGNOSIS — Z4682 Encounter for fitting and adjustment of non-vascular catheter: Secondary | ICD-10-CM | POA: Diagnosis not present

## 2017-09-30 DIAGNOSIS — I7092 Chronic total occlusion of artery of the extremities: Secondary | ICD-10-CM | POA: Diagnosis present

## 2017-09-30 DIAGNOSIS — I714 Abdominal aortic aneurysm, without rupture, unspecified: Secondary | ICD-10-CM | POA: Diagnosis present

## 2017-09-30 DIAGNOSIS — I700 Atherosclerosis of aorta: Secondary | ICD-10-CM | POA: Diagnosis present

## 2017-09-30 DIAGNOSIS — Z7982 Long term (current) use of aspirin: Secondary | ICD-10-CM

## 2017-09-30 HISTORY — PX: AORTA - BILATERAL FEMORAL ARTERY BYPASS GRAFT: SHX1175

## 2017-09-30 HISTORY — PX: ENDARTERECTOMY FEMORAL: SHX5804

## 2017-09-30 LAB — BLOOD GAS, ARTERIAL
Acid-base deficit: 2.3 mmol/L — ABNORMAL HIGH (ref 0.0–2.0)
BICARBONATE: 22.9 mmol/L (ref 20.0–28.0)
Drawn by: 41680
O2 CONTENT: 4 L/min
O2 SAT: 94.4 %
PATIENT TEMPERATURE: 98.6
PCO2 ART: 45.7 mmHg (ref 32.0–48.0)
PO2 ART: 79.7 mmHg — AB (ref 83.0–108.0)
pH, Arterial: 7.319 — ABNORMAL LOW (ref 7.350–7.450)

## 2017-09-30 LAB — BASIC METABOLIC PANEL
ANION GAP: 11 (ref 5–15)
BUN: 15 mg/dL (ref 6–20)
CO2: 21 mmol/L — ABNORMAL LOW (ref 22–32)
Calcium: 8.6 mg/dL — ABNORMAL LOW (ref 8.9–10.3)
Chloride: 102 mmol/L (ref 101–111)
Creatinine, Ser: 0.94 mg/dL (ref 0.61–1.24)
Glucose, Bld: 203 mg/dL — ABNORMAL HIGH (ref 65–99)
POTASSIUM: 4.1 mmol/L (ref 3.5–5.1)
SODIUM: 134 mmol/L — AB (ref 135–145)

## 2017-09-30 LAB — CBC
HCT: 36.6 % — ABNORMAL LOW (ref 39.0–52.0)
HEMOGLOBIN: 12.3 g/dL — AB (ref 13.0–17.0)
MCH: 33.1 pg (ref 26.0–34.0)
MCHC: 33.6 g/dL (ref 30.0–36.0)
MCV: 98.4 fL (ref 78.0–100.0)
PLATELETS: 136 10*3/uL — AB (ref 150–400)
RBC: 3.72 MIL/uL — AB (ref 4.22–5.81)
RDW: 13.6 % (ref 11.5–15.5)
WBC: 11.1 10*3/uL — AB (ref 4.0–10.5)

## 2017-09-30 LAB — APTT: APTT: 29 s (ref 24–36)

## 2017-09-30 LAB — PROTIME-INR
INR: 1.19
PROTHROMBIN TIME: 15 s (ref 11.4–15.2)

## 2017-09-30 LAB — PREPARE RBC (CROSSMATCH)

## 2017-09-30 LAB — MAGNESIUM: Magnesium: 1.7 mg/dL (ref 1.7–2.4)

## 2017-09-30 SURGERY — CREATION, BYPASS, ARTERIAL, AORTA TO FEMORAL, BILATERAL, USING GRAFT
Anesthesia: General | Site: Groin | Laterality: Bilateral

## 2017-09-30 MED ORDER — ONDANSETRON HCL 4 MG/2ML IJ SOLN: 4.0000 mg | Freq: Four times a day (QID) | INTRAMUSCULAR | Status: DC | PRN

## 2017-09-30 MED ORDER — SODIUM CHLORIDE 0.9 % IV SOLN: INTRAVENOUS | Status: DC

## 2017-09-30 MED ORDER — FENTANYL CITRATE (PF) 250 MCG/5ML IJ SOLN
INTRAMUSCULAR | Status: AC
  Filled 2017-09-30: qty 5

## 2017-09-30 MED ORDER — MIDAZOLAM HCL 5 MG/5ML IJ SOLN
INTRAMUSCULAR | Status: DC | PRN
  Administered 2017-09-30: 1 mg via INTRAVENOUS

## 2017-09-30 MED ORDER — PROMETHAZINE HCL 25 MG/ML IJ SOLN: 6.2500 mg | INTRAMUSCULAR | Status: DC | PRN

## 2017-09-30 MED ORDER — DEXTROSE 5 % IV SOLN
1.5000 g | Freq: Two times a day (BID) | INTRAVENOUS | Status: AC
  Administered 2017-09-30 – 2017-10-01 (×2): 1.5 g via INTRAVENOUS
  Filled 2017-09-30 (×2): qty 1.5

## 2017-09-30 MED ORDER — LIDOCAINE 2% (20 MG/ML) 5 ML SYRINGE
INTRAMUSCULAR | Status: AC
  Filled 2017-09-30: qty 5

## 2017-09-30 MED ORDER — METOPROLOL TARTRATE 5 MG/5ML IV SOLN: 2.0000 mg | INTRAVENOUS | Status: DC | PRN

## 2017-09-30 MED ORDER — PHENYLEPHRINE 40 MCG/ML (10ML) SYRINGE FOR IV PUSH (FOR BLOOD PRESSURE SUPPORT)
PREFILLED_SYRINGE | INTRAVENOUS | Status: AC
  Filled 2017-09-30: qty 10

## 2017-09-30 MED ORDER — 0.9 % SODIUM CHLORIDE (POUR BTL) OPTIME
TOPICAL | Status: DC | PRN
  Administered 2017-09-30: 3000 mL

## 2017-09-30 MED ORDER — ACETAMINOPHEN 325 MG RE SUPP: 325.0000 mg | RECTAL | Status: DC | PRN

## 2017-09-30 MED ORDER — GLYCOPYRROLATE 0.2 MG/ML IJ SOLN
INTRAMUSCULAR | Status: DC | PRN
  Administered 2017-09-30: .2 mg via INTRAVENOUS

## 2017-09-30 MED ORDER — SODIUM CHLORIDE 0.9 % IV SOLN: 500.0000 mL | Freq: Once | INTRAVENOUS | Status: DC | PRN

## 2017-09-30 MED ORDER — PROPOFOL 10 MG/ML IV BOLUS
INTRAVENOUS | Status: AC
  Filled 2017-09-30: qty 20

## 2017-09-30 MED ORDER — CHLORHEXIDINE GLUCONATE 4 % EX LIQD
60.0000 mL | Freq: Once | CUTANEOUS | Status: DC
  Administered 2017-09-30: 4 via TOPICAL

## 2017-09-30 MED ORDER — EPHEDRINE SULFATE 50 MG/ML IJ SOLN
INTRAMUSCULAR | Status: DC | PRN
  Administered 2017-09-30 (×2): 5 mg via INTRAVENOUS
  Administered 2017-09-30: 10 mg via INTRAVENOUS

## 2017-09-30 MED ORDER — MIDAZOLAM HCL 2 MG/2ML IJ SOLN
INTRAMUSCULAR | Status: AC
  Filled 2017-09-30: qty 2

## 2017-09-30 MED ORDER — PROPOFOL 10 MG/ML IV BOLUS
INTRAVENOUS | Status: DC | PRN
  Administered 2017-09-30: 140 mg via INTRAVENOUS

## 2017-09-30 MED ORDER — MIDAZOLAM HCL 2 MG/2ML IJ SOLN
1.5000 mg | Freq: Once | INTRAMUSCULAR | Status: AC
  Administered 2017-09-30: 1.5 mg via INTRAVENOUS

## 2017-09-30 MED ORDER — ALBUMIN HUMAN 5 % IV SOLN
INTRAVENOUS | Status: DC | PRN
  Administered 2017-09-30 (×3): via INTRAVENOUS

## 2017-09-30 MED ORDER — CHLORHEXIDINE GLUCONATE 4 % EX LIQD: 60.0000 mL | Freq: Once | CUTANEOUS | Status: DC

## 2017-09-30 MED ORDER — LACTATED RINGERS IV SOLN
INTRAVENOUS | Status: DC | PRN
  Administered 2017-09-30: 11:00:00 via INTRAVENOUS

## 2017-09-30 MED ORDER — ONDANSETRON HCL 4 MG/2ML IJ SOLN
INTRAMUSCULAR | Status: DC | PRN
  Administered 2017-09-30: 4 mg via INTRAVENOUS

## 2017-09-30 MED ORDER — HEPARIN SODIUM (PORCINE) 1000 UNIT/ML IJ SOLN
INTRAMUSCULAR | Status: DC | PRN
  Administered 2017-09-30: 3000 [IU] via INTRAVENOUS
  Administered 2017-09-30: 8000 [IU] via INTRAVENOUS

## 2017-09-30 MED ORDER — FENTANYL CITRATE (PF) 100 MCG/2ML IJ SOLN
50.0000 ug | Freq: Once | INTRAMUSCULAR | Status: AC
  Administered 2017-09-30: 50 ug via INTRAVENOUS

## 2017-09-30 MED ORDER — SUGAMMADEX SODIUM 200 MG/2ML IV SOLN
INTRAVENOUS | Status: AC
  Filled 2017-09-30: qty 2

## 2017-09-30 MED ORDER — LABETALOL HCL 5 MG/ML IV SOLN
INTRAVENOUS | Status: DC | PRN
  Administered 2017-09-30: 5 mg via INTRAVENOUS

## 2017-09-30 MED ORDER — GUAIFENESIN-DM 100-10 MG/5ML PO SYRP: 15.0000 mL | ORAL_SOLUTION | ORAL | Status: DC | PRN

## 2017-09-30 MED ORDER — FENTANYL CITRATE (PF) 100 MCG/2ML IJ SOLN
INTRAMUSCULAR | Status: DC | PRN
  Administered 2017-09-30: 100 ug via INTRAVENOUS
  Administered 2017-09-30: 150 ug via INTRAVENOUS
  Administered 2017-09-30: 50 ug via INTRAVENOUS
  Administered 2017-09-30: 150 ug via INTRAVENOUS
  Administered 2017-09-30: 50 ug via INTRAVENOUS
  Administered 2017-09-30: 100 ug via INTRAVENOUS

## 2017-09-30 MED ORDER — MORPHINE SULFATE (PF) 4 MG/ML IV SOLN
1.0000 mg | INTRAVENOUS | Status: DC | PRN
  Administered 2017-09-30 – 2017-10-02 (×13): 2 mg via INTRAVENOUS
  Filled 2017-09-30 (×14): qty 1

## 2017-09-30 MED ORDER — HYDRALAZINE HCL 20 MG/ML IJ SOLN
5.0000 mg | INTRAMUSCULAR | Status: AC | PRN
  Administered 2017-09-30 (×2): 5 mg via INTRAVENOUS
  Filled 2017-09-30 (×2): qty 1

## 2017-09-30 MED ORDER — MAGNESIUM SULFATE 2 GM/50ML IV SOLN: 2.0000 g | Freq: Every day | INTRAVENOUS | Status: DC | PRN

## 2017-09-30 MED ORDER — HYDROMORPHONE HCL 1 MG/ML IJ SOLN: 0.2500 mg | INTRAMUSCULAR | Status: DC | PRN

## 2017-09-30 MED ORDER — PHENYLEPHRINE HCL 10 MG/ML IJ SOLN
INTRAVENOUS | Status: DC | PRN
  Administered 2017-09-30: 25 ug/min via INTRAVENOUS

## 2017-09-30 MED ORDER — SUGAMMADEX SODIUM 200 MG/2ML IV SOLN
INTRAVENOUS | Status: DC | PRN
  Administered 2017-09-30: 150 mg via INTRAVENOUS

## 2017-09-30 MED ORDER — PROTAMINE SULFATE 10 MG/ML IV SOLN
INTRAVENOUS | Status: DC | PRN
  Administered 2017-09-30: 50 mg via INTRAVENOUS

## 2017-09-30 MED ORDER — DEXAMETHASONE SODIUM PHOSPHATE 10 MG/ML IJ SOLN
INTRAMUSCULAR | Status: DC | PRN
  Administered 2017-09-30: 10 mg via INTRAVENOUS

## 2017-09-30 MED ORDER — ENOXAPARIN SODIUM 40 MG/0.4ML ~~LOC~~ SOLN
40.0000 mg | SUBCUTANEOUS | Status: DC
  Administered 2017-10-01: 40 mg via SUBCUTANEOUS
  Filled 2017-09-30: qty 0.4

## 2017-09-30 MED ORDER — SODIUM BICARBONATE 8.4 % IV SOLN
INTRAVENOUS | Status: DC | PRN
  Administered 2017-09-30: 25 meq via INTRAVENOUS

## 2017-09-30 MED ORDER — POTASSIUM CHLORIDE CRYS ER 20 MEQ PO TBCR: 20.0000 meq | EXTENDED_RELEASE_TABLET | Freq: Every day | ORAL | Status: DC | PRN

## 2017-09-30 MED ORDER — ONDANSETRON HCL 4 MG/2ML IJ SOLN
INTRAMUSCULAR | Status: AC
  Filled 2017-09-30: qty 2

## 2017-09-30 MED ORDER — DOCUSATE SODIUM 100 MG PO CAPS
100.0000 mg | ORAL_CAPSULE | Freq: Every day | ORAL | Status: DC
  Administered 2017-10-03: 100 mg via ORAL
  Filled 2017-09-30 (×3): qty 1

## 2017-09-30 MED ORDER — LABETALOL HCL 5 MG/ML IV SOLN
10.0000 mg | INTRAVENOUS | Status: DC | PRN
  Administered 2017-09-30 – 2017-10-01 (×2): 10 mg via INTRAVENOUS
  Filled 2017-09-30 (×2): qty 4

## 2017-09-30 MED ORDER — DEXTROSE 5 % IV SOLN
1.5000 g | INTRAVENOUS | Status: AC
  Administered 2017-09-30: 1.5 g via INTRAVENOUS

## 2017-09-30 MED ORDER — LACTATED RINGERS IV SOLN
INTRAVENOUS | Status: DC
  Administered 2017-09-30 (×3): via INTRAVENOUS

## 2017-09-30 MED ORDER — KCL IN DEXTROSE-NACL 20-5-0.45 MEQ/L-%-% IV SOLN
INTRAVENOUS | Status: DC
  Administered 2017-09-30 – 2017-10-05 (×7): via INTRAVENOUS
  Filled 2017-09-30 (×9): qty 1000

## 2017-09-30 MED ORDER — ROCURONIUM BROMIDE 100 MG/10ML IV SOLN
INTRAVENOUS | Status: DC | PRN
  Administered 2017-09-30 (×3): 50 mg via INTRAVENOUS
  Administered 2017-09-30: 30 mg via INTRAVENOUS

## 2017-09-30 MED ORDER — HEPARIN SODIUM (PORCINE) 1000 UNIT/ML IJ SOLN
INTRAMUSCULAR | Status: AC
  Filled 2017-09-30: qty 1

## 2017-09-30 MED ORDER — DEXTROSE 5 % IV SOLN
INTRAVENOUS | Status: AC
  Filled 2017-09-30: qty 1.5

## 2017-09-30 MED ORDER — PANTOPRAZOLE SODIUM 40 MG PO TBEC: 40.0000 mg | DELAYED_RELEASE_TABLET | Freq: Every day | ORAL | Status: DC

## 2017-09-30 MED ORDER — PANTOPRAZOLE SODIUM 40 MG IV SOLR
40.0000 mg | INTRAVENOUS | Status: DC
  Administered 2017-09-30 – 2017-10-03 (×4): 40 mg via INTRAVENOUS
  Filled 2017-09-30 (×4): qty 40

## 2017-09-30 MED ORDER — LIDOCAINE HCL (CARDIAC) 20 MG/ML IV SOLN
INTRAVENOUS | Status: DC | PRN
  Administered 2017-09-30: 50 mg via INTRAVENOUS

## 2017-09-30 MED ORDER — ACETAMINOPHEN 325 MG PO TABS: 325.0000 mg | ORAL_TABLET | ORAL | Status: DC | PRN

## 2017-09-30 MED ORDER — BISACODYL 10 MG RE SUPP: 10.0000 mg | Freq: Every day | RECTAL | Status: DC | PRN

## 2017-09-30 MED ORDER — MANNITOL 25 % IV SOLN
INTRAVENOUS | Status: DC | PRN
  Administered 2017-09-30: 25 g via INTRAVENOUS

## 2017-09-30 MED ORDER — ROCURONIUM BROMIDE 10 MG/ML (PF) SYRINGE
PREFILLED_SYRINGE | INTRAVENOUS | Status: AC
  Filled 2017-09-30: qty 5

## 2017-09-30 MED ORDER — PHENOL 1.4 % MT LIQD: 1.0000 | OROMUCOSAL | Status: DC | PRN

## 2017-09-30 MED ORDER — FENTANYL CITRATE (PF) 100 MCG/2ML IJ SOLN
INTRAMUSCULAR | Status: AC
  Administered 2017-09-30: 50 ug via INTRAVENOUS
  Filled 2017-09-30: qty 2

## 2017-09-30 MED ORDER — ALUM & MAG HYDROXIDE-SIMETH 200-200-20 MG/5ML PO SUSP: 15.0000 mL | ORAL | Status: DC | PRN

## 2017-09-30 MED ORDER — SODIUM CHLORIDE 0.9 % IV SOLN
INTRAVENOUS | Status: DC | PRN
  Administered 2017-09-30: 500 mL

## 2017-09-30 MED ORDER — DEXAMETHASONE SODIUM PHOSPHATE 10 MG/ML IJ SOLN
INTRAMUSCULAR | Status: AC
  Filled 2017-09-30: qty 1

## 2017-09-30 MED ORDER — MIDAZOLAM HCL 2 MG/2ML IJ SOLN
INTRAMUSCULAR | Status: AC
  Administered 2017-09-30: 1.5 mg via INTRAVENOUS
  Filled 2017-09-30: qty 2

## 2017-09-30 SURGICAL SUPPLY — 60 items
CANISTER SUCT 3000ML PPV (MISCELLANEOUS) ×3 IMPLANT
CANNULA VESSEL 3MM 2 BLNT TIP (CANNULA) ×6 IMPLANT
CATH EMB 4FR 80CM (CATHETERS) ×3 IMPLANT
CLIP LIGATING EXTRA MED SLVR (CLIP) ×6 IMPLANT
CLIP LIGATING EXTRA SM BLUE (MISCELLANEOUS) ×3 IMPLANT
COVER BACK TABLE 60X90IN (DRAPES) IMPLANT
DERMABOND ADVANCED (GAUZE/BANDAGES/DRESSINGS) ×4
DERMABOND ADVANCED .7 DNX12 (GAUZE/BANDAGES/DRESSINGS) ×8 IMPLANT
DRAPE BILATERAL SPLIT (DRAPES) IMPLANT
DRAPE CV SPLIT W-CLR ANES SCRN (DRAPES) IMPLANT
ELECT BLADE 4.0 EZ CLEAN MEGAD (MISCELLANEOUS)
ELECT REM PT RETURN 9FT ADLT (ELECTROSURGICAL) ×6
ELECTRODE BLDE 4.0 EZ CLN MEGD (MISCELLANEOUS) IMPLANT
ELECTRODE REM PT RTRN 9FT ADLT (ELECTROSURGICAL) ×4 IMPLANT
FELT TEFLON 1X6 (MISCELLANEOUS) ×3 IMPLANT
GLOVE BIO SURGEON STRL SZ 6.5 (GLOVE) ×3 IMPLANT
GLOVE BIO SURGEON STRL SZ7 (GLOVE) ×3 IMPLANT
GLOVE BIOGEL PI IND STRL 6.5 (GLOVE) ×6 IMPLANT
GLOVE BIOGEL PI IND STRL 7.0 (GLOVE) ×6 IMPLANT
GLOVE BIOGEL PI INDICATOR 6.5 (GLOVE) ×3
GLOVE BIOGEL PI INDICATOR 7.0 (GLOVE) ×3
GLOVE SS BIOGEL STRL SZ 7.5 (GLOVE) ×2 IMPLANT
GLOVE SUPERSENSE BIOGEL SZ 7.5 (GLOVE) ×1
GLOVE SURG SS PI 6.5 STRL IVOR (GLOVE) ×6 IMPLANT
GOWN STRL REUS W/ TWL LRG LVL3 (GOWN DISPOSABLE) ×10 IMPLANT
GOWN STRL REUS W/ TWL XL LVL3 (GOWN DISPOSABLE) ×2 IMPLANT
GOWN STRL REUS W/TWL LRG LVL3 (GOWN DISPOSABLE) ×5
GOWN STRL REUS W/TWL XL LVL3 (GOWN DISPOSABLE) ×1
GRAFT HEMASHIELD 14X8MM (Vascular Products) ×3 IMPLANT
INSERT FOGARTY 61MM (MISCELLANEOUS) ×3 IMPLANT
INSERT FOGARTY SM (MISCELLANEOUS) ×6 IMPLANT
KIT BASIN OR (CUSTOM PROCEDURE TRAY) ×3 IMPLANT
KIT ROOM TURNOVER OR (KITS) ×3 IMPLANT
LOOP VESSEL MAXI BLUE (MISCELLANEOUS) ×3 IMPLANT
LOOP VESSEL MINI RED (MISCELLANEOUS) ×3 IMPLANT
NS IRRIG 1000ML POUR BTL (IV SOLUTION) ×9 IMPLANT
PACK AORTA (CUSTOM PROCEDURE TRAY) ×3 IMPLANT
PAD ARMBOARD 7.5X6 YLW CONV (MISCELLANEOUS) ×6 IMPLANT
SPONGE LAP 18X18 X RAY DECT (DISPOSABLE) IMPLANT
SUT ETHIBOND 5 LR DA (SUTURE) IMPLANT
SUT PDS AB 1 TP1 54 (SUTURE) ×6 IMPLANT
SUT PROLENE 3 0 SH1 36 (SUTURE) ×9 IMPLANT
SUT PROLENE 5 0 C 1 24 (SUTURE) ×27 IMPLANT
SUT PROLENE 5 0 C 1 36 (SUTURE) IMPLANT
SUT SILK 2 0 (SUTURE) ×1
SUT SILK 2 0 SH CR/8 (SUTURE) ×3 IMPLANT
SUT SILK 2 0 TIES 17X18 (SUTURE)
SUT SILK 2-0 18XBRD TIE 12 (SUTURE) ×2 IMPLANT
SUT SILK 2-0 18XBRD TIE BLK (SUTURE) IMPLANT
SUT SILK 3 0 (SUTURE)
SUT SILK 3 0 TIES 17X18 (SUTURE) ×1
SUT SILK 3-0 18XBRD TIE 12 (SUTURE) IMPLANT
SUT SILK 3-0 18XBRD TIE BLK (SUTURE) ×2 IMPLANT
SUT VIC AB 2-0 CT1 36 (SUTURE) ×9 IMPLANT
SUT VIC AB 3-0 SH 27 (SUTURE) ×5
SUT VIC AB 3-0 SH 27X BRD (SUTURE) ×10 IMPLANT
SYR 3ML LL SCALE MARK (SYRINGE) ×3 IMPLANT
TOWEL BLUE STERILE X RAY DET (MISCELLANEOUS) ×6 IMPLANT
TRAY FOLEY W/METER SILVER 16FR (SET/KITS/TRAYS/PACK) ×3 IMPLANT
WATER STERILE IRR 1000ML POUR (IV SOLUTION) ×6 IMPLANT

## 2017-09-30 NOTE — Progress Notes (Signed)
   Alert and agitated DP/PT B doppler Abdominal incision and groins soft without hematoma  S/P Aorto- bifem bypass  Stable condition  Roxy Horseman PA-C

## 2017-09-30 NOTE — Anesthesia Procedure Notes (Signed)
Anesthesia Procedure Image    

## 2017-09-30 NOTE — Transfer of Care (Signed)
Immediate Anesthesia Transfer of Care Note  Patient: [name] [name]  Procedure(s) Performed: AORTA BIFEMORAL BYPASS GRAFT (Bilateral ) ENDARTERECTOMY FEMORAL (Bilateral Groin)  Patient Location: PACU  Anesthesia Type:General  Level of Consciousness: drowsy and patient cooperative  Airway & Oxygen Therapy: Patient Spontanous Breathing and Patient connected to nasal cannula oxygen  Post-op Assessment: Report given to RN, Post -op Vital signs reviewed and stable and Patient moving all extremities X 4  Post vital signs: Reviewed and stable  Last Vitals:  Vitals:   09/30/17 1045 09/30/17 1050  BP: (!) 148/63 (!) 154/76  Pulse: 63 64  Resp: 13 14  Temp:    SpO2: 99% 100%    Last Pain:  Vitals:   09/30/17 0822  TempSrc: Oral         Complications: No apparent anesthesia complications

## 2017-09-30 NOTE — Anesthesia Procedure Notes (Signed)
Central Venous Catheter Insertion Performed by: Myrtie Soman, MD, anesthesiologist Start/End11/30/2018 10:30 AM, 09/30/2017 10:45 AM Patient location: Pre-op. Preanesthetic checklist: patient identified, IV checked, site marked, risks and benefits discussed, surgical consent, monitors and equipment checked, pre-op evaluation, timeout performed and anesthesia consent Position: Trendelenburg Lidocaine 1% used for infiltration and patient sedated Hand hygiene performed , maximum sterile barriers used  and Seldinger technique used Catheter size: 8 Fr Total catheter length 16. Central line was placed.Double lumen Procedure performed using ultrasound guided technique. Ultrasound Notes:anatomy identified, needle tip was noted to be adjacent to the nerve/plexus identified, no ultrasound evidence of intravascular and/or intraneural injection and image(s) printed for medical record Attempts: 1 Following insertion, dressing applied, line sutured and Biopatch. Post procedure assessment: blood return through all ports  Patient tolerated the procedure well with no immediate complications.

## 2017-09-30 NOTE — Anesthesia Procedure Notes (Signed)
Procedure Name: Intubation Date/Time: 09/30/2017 11:43 AM Performed by: Kerby Less, CRNA Pre-anesthesia Checklist: Patient identified, Emergency Drugs available, Suction available and Patient being monitored Patient Re-evaluated:Patient Re-evaluated prior to induction Oxygen Delivery Method: Circle System Utilized Preoxygenation: Pre-oxygenation with 100% oxygen Induction Type: IV induction Ventilation: Mask ventilation without difficulty Tube type: Oral Tube size: 7.5 mm Number of attempts: 1 Airway Equipment and Method: Stylet Placement Confirmation: ETT inserted through vocal cords under direct vision,  positive ETCO2 and breath sounds checked- equal and bilateral Secured at: 22 cm Tube secured with: Tape Dental Injury: Teeth and Oropharynx as per pre-operative assessment

## 2017-09-30 NOTE — Interval H&P Note (Signed)
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 After consideration of risks, benefits and other options for treatment, the patient has consented to  Procedure(s): AORTA BIFEMORAL BYPASS GRAFT (Bilateral) as a surgical intervention .  The patient's history has been reviewed, patient examined, no change in status, stable for surgery.  I have reviewed the patient's chart and labs.  Questions were answered to the patient's satisfaction.     Curt Jews

## 2017-09-30 NOTE — Op Note (Signed)
OPERATIVE REPORT  DATE OF SURGERY: 09/30/2017  PATIENT: [name] [name], 66 y.o. male MRN: [id number]  DOB: 1951/05/28  PRE-OPERATIVE DIAGNOSIS: Severe bilateral lower extremity claudication  POST-OPERATIVE DIAGNOSIS:  Same  PROCEDURE: Aortobifemoral bypass, bilateral common femoral and external iliac endarterectomies  SURGEON:  Curt Jews, M.D.  PHYSICIAN ASSISTANT: Matt Eveland PA-C  ANESTHESIA: General  EBL: 400 ml  Total I/O In: 3550 [I.V.:2800; IV Piggyback:750] Out: 850 [Urine:450; Blood:400]  BLOOD ADMINISTERED: None  DRAINS: NG  SPECIMEN: None  COUNTS CORRECT:  YES  PLAN OF CARE: PACU stable  PATIENT DISPOSITION:  PACU - hemodynamically stable  PROCEDURE DETAILS: Patient was taken to the upper and placed supine position the area of the abdomen both groins prepped and draped you sterile fashion.  Both groin incisions were made over the.  The patient had extensive calcification over both common femoral arteries.  The external iliac artery was closed up under the inguinal ligament and superficial femoral and profundus femoris arteries were isolated bilaterally.  Separate incision was made from the level of the xiphoid to below the umbilicus and carried down through the midline fascia with electrocautery.  The peritoneum was entered.  The small bowel and large bowel were normal as was the liver and gallbladder.  The NG tube was well-positioned.  Omni-Tract retractor was used for exposure.  The transverse colon and omentum were reflected superiorly the small bowel was reflected to the right.  The retroperitoneum was opened over the aorta and the aorta was encircled just below the level of the renal arteries.  The artery was extensively calcified.  Dissection extended down to the level of the bifurcation.  Tunnels were created from the level of the aortic bifurcation to the groin bilaterally take the ureters bilaterally.  The patient was given 8000 units of intravenous  heparin and 25 g of mannitol.  After adequate circulation time the aorta was occluded below the level of the renal arteries and occluded aorta was transected and was endarterectomized.  At the distal aorta was oversewn with 3-0 Prolene suture.  A 14 x 8 Hemashield graft was brought onto the field and using a felt strip reinforcement was sewn end-to-end to the aorta below the level.  This anastomosis was tested and found to be a the right and left limbs of the graft were then brought to the respective right and left groins.  The common superficial femoral and function was arteries were occluded on the right.  The artery was opened and there was extensive calcification.  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  The endarterectomy was continued up under the inguinal ligament and the external iliac artery and continued down onto the superficial femoral and profundus femoris arteries.  There was a good feathering endpoint on the right profunda and the superficial femoral artery plaque was tacked.  The limb of the graft was sewn is a long anastomosis from the level of the inguinal ligament down onto the superficial femoral artery with a running 5-0 Prolene suture.  The anastomosis was flushed prior to completion of the anastomosis and then.  Attention was then turned to the left leg.  Similarly the external iliac common and superficial femoral arteries and profundus femoris artery was occluded.  The artery was opened occluded with plaque.  This endarterectomy and onto the external iliac artery up under the inguinal ligament and good inflow was encountered with backbleeding.  The  endarterectomy extended down onto the superficial femoral and profundus femoris arteries.  Again the distal endpoint was tacked from the superficial femoral artery.  Again a long anastomosis was made from the external iliac at the inguinal ligament down to the superficial  femoral artery with running 5-0 Prolene suture.  After the usual flushing maneuvers anastomosed pleated and flow restored to the left leg.  Patient was given 50 mg of protamine to reverse the heparin.  The wounds irrigated with saline.  Hemostasis elect cautery.  The abdomen was re-examined and hemostasis was adequate.  The retroperitoneum was closed over the graft to prevent contact with the duodenum with a running 2-0 Vicryl suture.  The small bowel was run in its entirety and found to be without injury was placed back in the pelvis.  The transverse colon and omentum were placed over this.  Midline fascia was closed with a #1 PDS suture beginning proximally distally and tying in the middle.  The skin was closed with 3-0 subcuticular Vicryl stitch.  Next attention was turned to the groins.  The groins were closed with 2-0 Vicryl in several layers and the subcutaneous tissue and the skin was closed with 3-0 subcuticular Vicryl suture.  Sterile dressing was applied and the patient was transferred to the recovery room with palpable dorsalis pedis pulses   Rosetta Posner, M.D., [facility] 09/30/2017 5:00 PM

## 2017-09-30 NOTE — Anesthesia Preprocedure Evaluation (Addendum)
Anesthesia Evaluation  Patient identified by MRN, date of birth, ID band Patient awake    Reviewed: Allergy & Precautions, NPO status , Patient's Chart, lab work & pertinent test results  Airway Mallampati: II  TM Distance: >3 FB Neck ROM: Full    Dental no notable dental hx.    Pulmonary COPD, Current Smoker,    Pulmonary exam normal breath sounds clear to auscultation       Cardiovascular hypertension, Normal cardiovascular exam Rhythm:Regular Rate:Normal     Neuro/Psych negative neurological ROS  negative psych ROS   GI/Hepatic negative GI ROS, (+) Hepatitis -, C  Endo/Other  negative endocrine ROS  Renal/GU negative Renal ROS  negative genitourinary   Musculoskeletal negative musculoskeletal ROS (+)   Abdominal   Peds negative pediatric ROS (+)  Hematology negative hematology ROS (+)   Anesthesia Other Findings   Reproductive/Obstetrics negative OB ROS                             Anesthesia Physical Anesthesia Plan  ASA: III  Anesthesia Plan: General   Post-op Pain Management:    Induction: Intravenous  PONV Risk Score and Plan: 1 and Ondansetron, Dexamethasone and Treatment may vary due to age or medical condition  Airway Management Planned: Oral ETT  Additional Equipment: Arterial line  Intra-op Plan:   Post-operative Plan: Possible Post-op intubation/ventilation  Informed Consent: I have reviewed the patients History and Physical, chart, labs and discussed the procedure including the risks, benefits and alternatives for the proposed anesthesia with the patient or authorized representative who has indicated his/her understanding and acceptance.   Dental advisory given  Plan Discussed with: CRNA and Surgeon  Anesthesia Plan Comments:        Anesthesia Quick Evaluation

## 2017-09-30 NOTE — Anesthesia Postprocedure Evaluation (Signed)
Anesthesia Post Note  Patient: HARLEY FITZWATER  Procedure(s) Performed: AORTA BIFEMORAL BYPASS GRAFT (Bilateral ) ENDARTERECTOMY FEMORAL (Bilateral Groin)     Patient location during evaluation: PACU Anesthesia Type: General Level of consciousness: awake and alert Pain management: pain level controlled Vital Signs Assessment: post-procedure vital signs reviewed and stable Respiratory status: spontaneous breathing, nonlabored ventilation, respiratory function stable and patient connected to nasal cannula oxygen Cardiovascular status: blood pressure returned to baseline and stable Postop Assessment: no apparent nausea or vomiting Anesthetic complications: no    Last Vitals:  Vitals:   09/30/17 1710 09/30/17 1725  BP: (!) 154/77 (!) 143/74  Pulse: 76 72  Resp: 17 12  Temp:  (!) 36.4 C  SpO2: 92% 92%    Last Pain:  Vitals:   09/30/17 1656  TempSrc:   PainSc: 0-No pain    LLE Motor Response: Purposeful movement;Responds to commands (09/30/17 1725) LLE Sensation: Full sensation (09/30/17 1725) RLE Motor Response: Purposeful movement;Responds to commands (09/30/17 1725) RLE Sensation: Full sensation (09/30/17 1725)      Effie Berkshire

## 2017-09-30 NOTE — Anesthesia Procedure Notes (Signed)
Arterial Line Insertion Start/End11/30/2018 10:15 AM, 09/30/2017 10:25 AM Performed by: Kerby Less, CRNA, CRNA  Patient location: Pre-op. Preanesthetic checklist: patient identified, IV checked, site marked, risks and benefits discussed, surgical consent, monitors and equipment checked, pre-op evaluation, timeout performed and anesthesia consent Lidocaine 1% used for infiltration radial was placed Catheter size: 20 G Hand hygiene performed , maximum sterile barriers used  and Seldinger technique used Allen's test indicative of satisfactory collateral circulation Attempts: 1 Procedure performed without using ultrasound guided technique. Following insertion, dressing applied and Biopatch. Post procedure assessment: normal  Patient tolerated the procedure well with no immediate complications.

## 2017-10-01 ENCOUNTER — Inpatient Hospital Stay (HOSPITAL_COMMUNITY): Payer: Medicare Other

## 2017-10-01 ENCOUNTER — Encounter (HOSPITAL_COMMUNITY): Payer: Self-pay | Admitting: *Deleted

## 2017-10-01 ENCOUNTER — Other Ambulatory Visit: Payer: Self-pay

## 2017-10-01 LAB — POCT I-STAT 7, (LYTES, BLD GAS, ICA,H+H)
Acid-base deficit: 3 mmol/L — ABNORMAL HIGH (ref 0.0–2.0)
Acid-base deficit: 4 mmol/L — ABNORMAL HIGH (ref 0.0–2.0)
BICARBONATE: 23.8 mmol/L (ref 20.0–28.0)
Bicarbonate: 23.6 mmol/L (ref 20.0–28.0)
CALCIUM ION: 1.17 mmol/L (ref 1.15–1.40)
Calcium, Ion: 1.14 mmol/L — ABNORMAL LOW (ref 1.15–1.40)
HCT: 33 % — ABNORMAL LOW (ref 39.0–52.0)
HEMATOCRIT: 34 % — AB (ref 39.0–52.0)
HEMOGLOBIN: 11.6 g/dL — AB (ref 13.0–17.0)
Hemoglobin: 11.2 g/dL — ABNORMAL LOW (ref 13.0–17.0)
O2 SAT: 94 %
O2 Saturation: 95 %
PCO2 ART: 47.3 mmHg (ref 32.0–48.0)
PCO2 ART: 53.1 mmHg — AB (ref 32.0–48.0)
POTASSIUM: 4.8 mmol/L (ref 3.5–5.1)
Patient temperature: 36.9
Potassium: 4.3 mmol/L (ref 3.5–5.1)
SODIUM: 136 mmol/L (ref 135–145)
Sodium: 134 mmol/L — ABNORMAL LOW (ref 135–145)
TCO2: 25 mmol/L (ref 22–32)
TCO2: 25 mmol/L (ref 22–32)
pH, Arterial: 7.308 — ABNORMAL LOW (ref 7.350–7.450)
pH, Arterial: 7.256 — ABNORMAL LOW (ref 7.350–7.450)
pO2, Arterial: 82 mmHg — ABNORMAL LOW (ref 83.0–108.0)
pO2, Arterial: 85 mmHg (ref 83.0–108.0)

## 2017-10-01 LAB — BASIC METABOLIC PANEL
Anion gap: 7 (ref 5–15)
BUN: 17 mg/dL (ref 6–20)
CALCIUM: 8.6 mg/dL — AB (ref 8.9–10.3)
CHLORIDE: 103 mmol/L (ref 101–111)
CO2: 24 mmol/L (ref 22–32)
CREATININE: 0.85 mg/dL (ref 0.61–1.24)
GFR calc Af Amer: >60 mL/min (ref >60)
GFR calc non Af Amer: >60 mL/min (ref >60)
GLUCOSE: 325 mg/dL — AB (ref 65–99)
Potassium: 4.4 mmol/L (ref 3.5–5.1)
Sodium: 134 mmol/L — ABNORMAL LOW (ref 135–145)

## 2017-10-01 LAB — CBC
HEMATOCRIT: 36.7 % — AB (ref 39.0–52.0)
Hemoglobin: 12.2 g/dL — ABNORMAL LOW (ref 13.0–17.0)
MCH: 32.3 pg (ref 26.0–34.0)
MCHC: 33.2 g/dL (ref 30.0–36.0)
MCV: 97.1 fL (ref 78.0–100.0)
Platelets: 137 10*3/uL — ABNORMAL LOW (ref 150–400)
RBC: 3.78 MIL/uL — ABNORMAL LOW (ref 4.22–5.81)
RDW: 13.4 % (ref 11.5–15.5)
WBC: 9.1 10*3/uL (ref 4.0–10.5)

## 2017-10-01 MED ORDER — CHLORHEXIDINE GLUCONATE 0.12 % MT SOLN
15.0000 mL | Freq: Two times a day (BID) | OROMUCOSAL | Status: DC
Start: 2017-10-01 — End: 2017-10-04
  Administered 2017-10-01 – 2017-10-03 (×7): 15 mL via OROMUCOSAL
  Filled 2017-10-01 (×5): qty 15

## 2017-10-01 MED ORDER — METOPROLOL TARTRATE 5 MG/5ML IV SOLN
5.0000 mg | Freq: Four times a day (QID) | INTRAVENOUS | Status: DC
  Administered 2017-10-01 – 2017-10-02 (×4): 5 mg via INTRAVENOUS
  Filled 2017-10-01 (×4): qty 5

## 2017-10-01 MED ORDER — ORAL CARE MOUTH RINSE
15.0000 mL | Freq: Two times a day (BID) | OROMUCOSAL | Status: DC
Start: 2017-10-01 — End: 2017-10-04
  Administered 2017-10-01 – 2017-10-03 (×6): 15 mL via OROMUCOSAL

## 2017-10-01 NOTE — Evaluation (Signed)
Physical Therapy Evaluation Patient Details Name: XZAIVER VAYDA MRN: [id number] DOB: June 10, 1951 Today's Date: 10/01/2017   History of Present Illness  Patient is a 66 yo male admitted 09/30/17 with AAA, Rt foot "broken", and severe Bilateral LE claudication.  Patient is s/p Aortobifemoral BPG, and bilateral common femoral and external iliac endarterectomies.  PMH:  PAD, HOH, COPD, tobacco use, HTN, Hep C  Clinical Impression  Patient presents with problems listed below.  Will benefit from acute PT to maximize functional mobility prior to discharge.  Patient was independent pta, and lives alone.  Today patient requiring assist with mobility and gait.  Recommend patient d/c to SNF for continued therapy with goal to reach Mod I functional level.  This will allow patient to return home safely.    Follow Up Recommendations SNF;Supervision for mobility/OOB    Equipment Recommendations  Rolling walker with 5" wheels    Recommendations for Other Services       Precautions / Restrictions Precautions Precautions: Fall Precaution Comments: Abd and BLE incision sites Restrictions Weight Bearing Restrictions: No      Mobility  Bed Mobility Overal bed mobility: Needs Assistance Bed Mobility: Supine to Sit     Supine to sit: Mod assist     General bed mobility comments: Patient able to maneuver LE's to EOB.  Required assist to raise trunk to upright sitting position.  Transfers Overall transfer level: Needs assistance Equipment used: Rolling walker (2 wheeled) Transfers: Sit to/from Stand Sit to Stand: Mod assist         General transfer comment: Verbal cues for hand placement.  Assist to power up to standing and for balance.  Ambulation/Gait Ambulation/Gait assistance: Min assist;+2 safety/equipment Ambulation Distance (Feet): 270 Feet Assistive device: Rolling walker (2 wheeled) Gait Pattern/deviations: Step-through pattern;Decreased stride length;Drifts right/left;Trunk  flexed Gait velocity: decreased Gait velocity interpretation: Below normal speed for age/gender General Gait Details: Verbal cues for safe use of RW.  Patient keeps RW too far ahead of himself with elbows extended, leaning forward onto RW.  Cues to keep feet inside RW.  Stairs            Wheelchair Mobility    Modified Rankin (Stroke Patients Only)       Balance Overall balance assessment: Needs assistance Sitting-balance support: Feet supported;No upper extremity supported Sitting balance-Leahy Scale: Good     Standing balance support: No upper extremity supported Standing balance-Leahy Scale: Fair                               Pertinent Vitals/Pain Pain Assessment: 0-10 Pain Score: 8  Pain Location: Abd incision (for short time) Pain Descriptors / Indicators: Spasm;Sharp Pain Intervention(s): Monitored during session;Repositioned    Home Living Family/patient expects to be discharged to:: Private residence Living Arrangements: Alone Available Help at Discharge: Other (Comment)(Patient reports no help available) Type of Home: Mobile home Home Access: Stairs to enter Entrance Stairs-Rails: Right;Left Entrance Stairs-Number of Steps: 4 Home Layout: One level Home Equipment: None      Prior Function Level of Independence: Independent         Comments: Drives.  Has multiple dogs that he takes care of.     Hand Dominance        Extremity/Trunk Assessment   Upper Extremity Assessment Upper Extremity Assessment: Defer to OT evaluation    Lower Extremity Assessment Lower Extremity Assessment: Generalized weakness;RLE deficits/detail;LLE deficits/detail RLE Deficits / Details: Incision in groin  from surgery.  Patient with "broken" Rt foot pta.  LLE Deficits / Details: Incision in groin area from surgery.    Cervical / Trunk Assessment Cervical / Trunk Assessment: Other exceptions Cervical / Trunk Exceptions: Abdominal incision pain  impacting mobility  Communication   Communication: HOH  Cognition Arousal/Alertness: Awake/alert Behavior During Therapy: Restless;Anxious;Impulsive Overall Cognitive Status: No family/caregiver present to determine baseline cognitive functioning                                 General Comments: Decreased safety awareness.      General Comments      Exercises     Assessment/Plan    PT Assessment Patient needs continued PT services  PT Problem List Decreased strength;Decreased activity tolerance;Decreased balance;Decreased mobility;Decreased knowledge of use of DME;Decreased safety awareness;Decreased knowledge of precautions;Cardiopulmonary status limiting activity;Pain       PT Treatment Interventions DME instruction;Gait training;Functional mobility training;Therapeutic activities;Therapeutic exercise;Balance training;Patient/family education    PT Goals (Current goals can be found in the Care Plan section)  Acute Rehab PT Goals Patient Stated Goal: To go home PT Goal Formulation: With patient Time For Goal Achievement: 10/08/17 Potential to Achieve Goals: Good    Frequency Min 3X/week   Barriers to discharge Decreased caregiver support Patient reports no assistance at home.    Co-evaluation               AM-PAC PT "6 Clicks" Daily Activity  Outcome Measure Difficulty turning over in bed (including adjusting bedclothes, sheets and blankets)?: None Difficulty moving from lying on back to sitting on the side of the bed? : Unable Difficulty sitting down on and standing up from a chair with arms (e.g., wheelchair, bedside commode, etc,.)?: Unable Help needed moving to and from a bed to chair (including a wheelchair)?: A Little Help needed walking in hospital room?: A Little Help needed climbing 3-5 steps with a railing? : A Lot 6 Click Score: 14    End of Session Equipment Utilized During Treatment: Gait belt;Oxygen Activity Tolerance: Patient  tolerated treatment well;Patient limited by fatigue Patient left: in chair;with call bell/phone within reach Nurse Communication: Mobility status(RN present for session.) PT Visit Diagnosis: Unsteadiness on feet (R26.81);Other abnormalities of gait and mobility (R26.89);Muscle weakness (generalized) (M62.81);Pain Pain - part of body: (Abdomen)    Time: 8144-8185 PT Time Calculation (min) (ACUTE ONLY): 36 min   Charges:   PT Evaluation $PT Eval Moderate Complexity: 1 Mod PT Treatments $Gait Training: 8-22 mins   PT G Codes:        Carita Pian. Sanjuana Kava, [facility] Acute Rehab Services Pager [place] 10/01/2017, 6:18 PM

## 2017-10-01 NOTE — Progress Notes (Signed)
  Progress Note    10/01/2017 8:58 AM 1 Day Post-Op  Subjective:  Complaining of pain overnight  Vitals:   10/01/17 0645 10/01/17 0700  BP:  135/74  Pulse: 64 65  Resp: 10 12  Temp:    SpO2: 94% 97%    Physical Exam: aaox3 Non labored respirations Abdomen is soft Incisions are cdi Palpable PT bilaterally  CBC    Component Value Date/Time   WBC 9.1 10/01/2017 0400   RBC 3.78 (L) 10/01/2017 0400   HGB 12.2 (L) 10/01/2017 0400   HCT 36.7 (L) 10/01/2017 0400   PLT 137 (L) 10/01/2017 0400   MCV 97.1 10/01/2017 0400   MCH 32.3 10/01/2017 0400   MCHC 33.2 10/01/2017 0400   RDW 13.4 10/01/2017 0400   LYMPHSABS 1.9 08/19/2016 1456   MONOABS 0.6 08/19/2016 1456   EOSABS 0.1 08/19/2016 1456   BASOSABS 0.0 08/19/2016 1456    BMET    Component Value Date/Time   NA 134 (L) 10/01/2017 0400   K 4.4 10/01/2017 0400   CL 103 10/01/2017 0400   CO2 24 10/01/2017 0400   GLUCOSE 325 (H) 10/01/2017 0400   BUN 17 10/01/2017 0400   CREATININE 0.85 10/01/2017 0400   CALCIUM 8.6 (L) 10/01/2017 0400   GFRNONAA >60 10/01/2017 0400   GFRAA >60 10/01/2017 0400    INR    Component Value Date/Time   INR 1.19 09/30/2017 1748     Intake/Output Summary (Last 24 hours) at 10/01/2017 0858 Last data filed at 10/01/2017 0600 Gross per 24 hour  Intake 4977.08 ml  Output 1660 ml  Net 3317.08 ml     Assessment:  66 y.o. male is s/p AoBF for claudication  Plan:  Neuro: prn iv morphine CV: hd stable, scheduled iv metoprolol until taking po Pulm: oob, incentive spirometry GI: ng tube clamped with minimal output, possibly remove later today if no nausea FEN: Cr wnl and making urine, dc foley today Heme/ID: abx to stop, h/h wnl PPx: lovenox Dispo: continue icu care, oob to chair    Ilan Kahrs C. Donzetta Matters, MD [facility] [place] Office: [phone] Pager: [phone]  10/01/2017 8:58 AM

## 2017-10-02 ENCOUNTER — Encounter (HOSPITAL_COMMUNITY): Payer: Self-pay | Admitting: Vascular Surgery

## 2017-10-02 LAB — CBC
HEMATOCRIT: 36.4 % — AB (ref 39.0–52.0)
HEMOGLOBIN: 12.2 g/dL — AB (ref 13.0–17.0)
MCH: 33.2 pg (ref 26.0–34.0)
MCHC: 33.5 g/dL (ref 30.0–36.0)
MCV: 99.2 fL (ref 78.0–100.0)
Platelets: 136 10*3/uL — ABNORMAL LOW (ref 150–400)
RBC: 3.67 MIL/uL — ABNORMAL LOW (ref 4.22–5.81)
RDW: 13.6 % (ref 11.5–15.5)
WBC: 11.4 10*3/uL — ABNORMAL HIGH (ref 4.0–10.5)

## 2017-10-02 LAB — BASIC METABOLIC PANEL
ANION GAP: 5 (ref 5–15)
BUN: 17 mg/dL (ref 6–20)
CO2: 28 mmol/L (ref 22–32)
Calcium: 8.7 mg/dL — ABNORMAL LOW (ref 8.9–10.3)
Chloride: 100 mmol/L — ABNORMAL LOW (ref 101–111)
Creatinine, Ser: 0.65 mg/dL (ref 0.61–1.24)
GFR calc Af Amer: >60 mL/min (ref >60)
GFR calc non Af Amer: >60 mL/min (ref >60)
Glucose, Bld: 187 mg/dL — ABNORMAL HIGH (ref 65–99)
POTASSIUM: 4.3 mmol/L (ref 3.5–5.1)
Sodium: 133 mmol/L — ABNORMAL LOW (ref 135–145)

## 2017-10-02 LAB — GLUCOSE, CAPILLARY: GLUCOSE-CAPILLARY: 140 mg/dL — AB (ref 65–99)

## 2017-10-02 MED ORDER — CHLORHEXIDINE GLUCONATE CLOTH 2 % EX PADS
6.0000 | MEDICATED_PAD | Freq: Every day | CUTANEOUS | Status: DC
  Administered 2017-10-02 – 2017-10-03 (×2): 6 via TOPICAL

## 2017-10-02 MED ORDER — LORAZEPAM 2 MG/ML IJ SOLN
1.0000 mg | Freq: Once | INTRAMUSCULAR | Status: AC
  Administered 2017-10-02: 1 mg via INTRAVENOUS
  Filled 2017-10-02: qty 1

## 2017-10-02 MED ORDER — SODIUM CHLORIDE 0.9% FLUSH
10.0000 mL | Freq: Two times a day (BID) | INTRAVENOUS | Status: DC
  Administered 2017-10-02 – 2017-10-04 (×5): 10 mL

## 2017-10-02 MED ORDER — SODIUM CHLORIDE 0.9% FLUSH: 10.0000 mL | INTRAVENOUS | Status: DC | PRN

## 2017-10-02 MED ORDER — DIVALPROEX SODIUM 500 MG PO DR TAB
500.0000 mg | DELAYED_RELEASE_TABLET | Freq: Two times a day (BID) | ORAL | Status: DC
  Administered 2017-10-02 – 2017-10-05 (×7): 500 mg via ORAL
  Filled 2017-10-02 (×7): qty 1

## 2017-10-02 MED ORDER — ATENOLOL 25 MG PO TABS
50.0000 mg | ORAL_TABLET | Freq: Every day | ORAL | Status: DC
  Administered 2017-10-02 – 2017-10-04 (×3): 50 mg via ORAL
  Filled 2017-10-02 (×3): qty 2

## 2017-10-02 NOTE — Progress Notes (Signed)
  Progress Note    10/02/2017 9:33 AM 2 Days Post-Op  Subjective:  Confused overnight, responded to Ativan  Vitals:   10/02/17 0800 10/02/17 0900  BP: (!) 162/84 (!) 188/91  Pulse: 100 77  Resp: 16 18  Temp:    SpO2: 91% 95%    Physical Exam: He is al Non labored respirations Abdomen is soft Incisions are cdi Palpable PTs   CBC    Component Value Date/Time   WBC 9.1 10/01/2017 0400   RBC 3.78 (L) 10/01/2017 0400   HGB 12.2 (L) 10/01/2017 0400   HCT 36.7 (L) 10/01/2017 0400   PLT 137 (L) 10/01/2017 0400   MCV 97.1 10/01/2017 0400   MCH 32.3 10/01/2017 0400   MCHC 33.2 10/01/2017 0400   RDW 13.4 10/01/2017 0400   LYMPHSABS 1.9 08/19/2016 1456   MONOABS 0.6 08/19/2016 1456   EOSABS 0.1 08/19/2016 1456   BASOSABS 0.0 08/19/2016 1456    BMET    Component Value Date/Time   NA 134 (L) 10/01/2017 0400   K 4.4 10/01/2017 0400   CL 103 10/01/2017 0400   CO2 24 10/01/2017 0400   GLUCOSE 325 (H) 10/01/2017 0400   BUN 17 10/01/2017 0400   CREATININE 0.85 10/01/2017 0400   CALCIUM 8.6 (L) 10/01/2017 0400   GFRNONAA >60 10/01/2017 0400   GFRAA >60 10/01/2017 0400    INR    Component Value Date/Time   INR 1.19 09/30/2017 1748     Intake/Output Summary (Last 24 hours) at 10/02/2017 0933 Last data filed at 10/02/2017 0800 Gross per 24 hour  Intake 1758.33 ml  Output 1125 ml  Net 633.33 ml     Assessment:  66 y.o. male is s/p AoBF for claudication  Plan: Neuro: prn ativan, will try to keep awake during the day. Restart home depakote CV: hd stable, po atenolol Pulm: oob, incentive spirometry GI: ng tube is out, clear sips FEN: f/u labs Heme/ID: f/u labs PPx: lovenox Dispo: continue icu care mostly for mental status    Emmalina Espericueta C. Donzetta Matters, MD [facility] [place] Office: [phone] Pager: [phone]  10/02/2017 9:33 AM

## 2017-10-02 NOTE — Progress Notes (Signed)
Pt confused and constantly trying to get out of chair. Pt also taking cords and wires off of himself and attempting to eat them (such as the O2 finger monitor). Pt states "he is trying to get home." RN continuously redirecting and reorienting. MD Donzetta Matters called and ordered one time dose of 1mg  ativan. Chair alarm on, RN will continue to monitor.

## 2017-10-02 NOTE — Progress Notes (Signed)
Dr Donzetta Matters informed that pt has become more restless and confused overnight. Pt is requesting for NG tube to come out. MD ordered for NG tube to de d/c if output is less than 200 ml and to give ativan for help pt relax and calm down.

## 2017-10-03 ENCOUNTER — Encounter (HOSPITAL_COMMUNITY): Payer: Self-pay | Admitting: *Deleted

## 2017-10-03 LAB — TYPE AND SCREEN
ABO/RH(D): AB POS
ANTIBODY SCREEN: NEGATIVE
UNIT DIVISION: 0
Unit division: 0

## 2017-10-03 LAB — BPAM RBC
BLOOD PRODUCT EXPIRATION DATE: 201812252359
Blood Product Expiration Date: 201812252359
Unit Type and Rh: 5100
Unit Type and Rh: 5100

## 2017-10-03 MED FILL — Sodium Chloride IV Soln 0.9%: INTRAVENOUS | Qty: 1000 | Status: AC

## 2017-10-03 MED FILL — Heparin Sodium (Porcine) Inj 1000 Unit/ML: INTRAMUSCULAR | Qty: 30 | Status: AC

## 2017-10-03 NOTE — Plan of Care (Signed)
  Progressing Education: Knowledge of General Education information will improve 10/03/2017 0421 - Progressing by Randal Buba, RN Health Behavior/Discharge Planning: Ability to manage health-related needs will improve 10/03/2017 0421 - Progressing by Randal Buba, RN Clinical Measurements: Ability to maintain clinical measurements within normal limits will improve 10/03/2017 0421 - Progressing by Randal Buba, RN Will remain free from infection 10/03/2017 0421 - Progressing by Randal Buba, RN Diagnostic test results will improve 10/03/2017 0421 - Progressing by Randal Buba, RN Respiratory complications will improve 10/03/2017 0421 - Progressing by Randal Buba, RN Cardiovascular complication will be avoided 10/03/2017 0421 - Progressing by Randal Buba, RN Activity: Risk for activity intolerance will decrease 10/03/2017 0421 - Progressing by Randal Buba, RN Nutrition: Adequate nutrition will be maintained 10/03/2017 0421 - Progressing by Randal Buba, RN Coping: Level of anxiety will decrease 10/03/2017 0421 - Progressing by Randal Buba, RN Elimination: Will not experience complications related to bowel motility 10/03/2017 0421 - Progressing by Randal Buba, RN Will not experience complications related to urinary retention 10/03/2017 0421 - Progressing by Randal Buba, RN Pain Managment: General experience of comfort will improve 10/03/2017 0421 - Progressing by Randal Buba, RN Safety: Ability to remain free from injury will improve 10/03/2017 0421 - Progressing by Randal Buba, RN Skin Integrity: Risk for impaired skin integrity will decrease 10/03/2017 0421 - Progressing by Randal Buba, RN

## 2017-10-03 NOTE — Progress Notes (Addendum)
  Progress Note    10/03/2017 10:53 AM 3 Days Post-Op  Subjective:  Pt much less confused this morning per nursing.  He denies rest pain BLE.  Also denies flatus.   Vitals:   10/03/17 0805 10/03/17 0900  BP:  (!) 158/68  Pulse:  67  Resp:  15  Temp: 98.8 F (37.1 C)   SpO2:  98%   Physical Exam: Cardiac:  RRR Lungs: scattered adventitious sounds changing with cough Extremities:  R groin incision soft without fluctuance or drainage; L groin incision also stable; AT and PT multiphasic by doppler BLE Abdomen:  Normoactive bowel sounds; soft; incision without drainage or areas of fluctuance Neurologic: moving all extremities well  CBC    Component Value Date/Time   WBC 11.4 (H) 10/02/2017 1000   RBC 3.67 (L) 10/02/2017 1000   HGB 12.2 (L) 10/02/2017 1000   HCT 36.4 (L) 10/02/2017 1000   PLT 136 (L) 10/02/2017 1000   MCV 99.2 10/02/2017 1000   MCH 33.2 10/02/2017 1000   MCHC 33.5 10/02/2017 1000   RDW 13.6 10/02/2017 1000   LYMPHSABS 1.9 08/19/2016 1456   MONOABS 0.6 08/19/2016 1456   EOSABS 0.1 08/19/2016 1456   BASOSABS 0.0 08/19/2016 1456    BMET    Component Value Date/Time   NA 133 (L) 10/02/2017 1000   K 4.3 10/02/2017 1000   CL 100 (L) 10/02/2017 1000   CO2 28 10/02/2017 1000   GLUCOSE 187 (H) 10/02/2017 1000   BUN 17 10/02/2017 1000   CREATININE 0.65 10/02/2017 1000   CALCIUM 8.7 (L) 10/02/2017 1000   GFRNONAA >60 10/02/2017 1000   GFRAA >60 10/02/2017 1000    INR    Component Value Date/Time   INR 1.19 09/30/2017 1748     Intake/Output Summary (Last 24 hours) at 10/03/2017 1053 Last data filed at 10/03/2017 0900 Gross per 24 hour  Intake 1785 ml  Output 1200 ml  Net 585 ml     Assessment/Plan:  66 y.o. male is s/p ABF with B CFA endarterectomies and profundoplasty 3 Days Post-Op   Encouraged OOB with assist Encouraged IS Monitor peripheral signals Recheck CBC in am; monitor white count Advance diet as tolerated Transfer to 4E if ok  with Dr. Donnetta Hutching  DVT prophylaxis:  subq heparin   Dagoberto Ligas, PA-C [facility] [phone] 10/03/2017 10:53 AM  I have examined the patient, reviewed and agree with above.  Alert and oriented this afternoon.  No nausea.  Has been up walking around the unit.  Palpable dorsalis pedis pulses bilaterally.  Will be transferred to floor.  Advance diet  Curt Jews, MD 10/03/2017 3:54 PM

## 2017-10-03 NOTE — Progress Notes (Signed)
Pt offered pain medicine but refused.

## 2017-10-03 NOTE — Evaluation (Signed)
Occupational Therapy Evaluation Patient Details Name: [name] [name] MRN: [id number] DOB: [birth year] Today's Date: 10/03/2017    History of Present Illness Patient is a 66 yo male admitted 09/30/17 with AAA, Rt foot "broken", and severe Bilateral LE claudication.  Patient is s/p Aortobifemoral BPG, and bilateral common femoral and external iliac endarterectomies.  PMH:  PAD, HOH, COPD, tobacco use, HTN, Hep C   Clinical Impression   PTA, pt reports independence with ADL and functional mobility and lives alone. He completes all home management tasks independently and drives. Pt currently confused during evaluation demonstrating decreased awareness and decreased judgement. Pt requires max assist for LB ADL, min assist for UB ADL, and min assist for toilet transfers. Pt would benefit from continued OT services while admitted to improve independence and safety with ADL. At current functional level, recommend SNF level rehabilitation post-acute D/C; however, will continue to update based on progress.      Follow Up Recommendations  SNF;Supervision/Assistance - 24 hour    Equipment Recommendations  Other (comment)(defer to next venue of care)    Recommendations for Other Services       Precautions / Restrictions Precautions Precautions: Fall Precaution Comments: Abd and BLE incision sites Restrictions Weight Bearing Restrictions: No      Mobility Bed Mobility Overal bed mobility: Needs Assistance Bed Mobility: Sit to Sidelying;Rolling Rolling: Supervision       Sit to sidelying: Min guard General bed mobility comments: Min guard assist for safety and to complete with good technique.   Transfers Overall transfer level: Needs assistance Equipment used: Rolling walker (2 wheeled) Transfers: Sit to/from Stand Sit to Stand: Mod assist         General transfer comment: Verbal cues for hand placement.  Assist to power up to standing and for balance.    Balance Overall balance  assessment: Needs assistance Sitting-balance support: Feet supported;No upper extremity supported Sitting balance-Leahy Scale: Good     Standing balance support: No upper extremity supported Standing balance-Leahy Scale: Fair                             ADL either performed or assessed with clinical judgement   ADL Overall ADL's : Needs assistance/impaired Eating/Feeding: Sitting;Supervision/ safety(clear liquids)   Grooming: Sitting;Supervision/safety   Upper Body Bathing: Minimal assistance;Sitting   Lower Body Bathing: Maximal assistance;Sit to/from stand   Upper Body Dressing : Minimal assistance;Sitting   Lower Body Dressing: Maximal assistance;Sit to/from stand   Toilet Transfer: Minimal assistance;Ambulation;RW Toilet Transfer Details (indicate cue type and reason): Min assist for stability. VC's for safe use of RW Toileting- Clothing Manipulation and Hygiene: Maximal assistance;Sit to/from stand       Functional mobility during ADLs: Minimal assistance;Rolling walker General ADL Comments: Min assist for stability     Vision Baseline Vision/History: Wears glasses Wears Glasses: At all times Patient Visual Report: No change from baseline Vision Assessment?: No apparent visual deficits Additional Comments: Wearing glasses throughout     Perception     Praxis      Pertinent Vitals/Pain Pain Assessment: 0-10 Pain Score: 6  Pain Location: abdomen Pain Descriptors / Indicators: Discomfort;Grimacing Pain Intervention(s): Monitored during session;Repositioned     Hand Dominance     Extremity/Trunk Assessment Upper Extremity Assessment Upper Extremity Assessment: Generalized weakness   Lower Extremity Assessment Lower Extremity Assessment: Generalized weakness;Defer to PT evaluation       Communication Communication Communication: [facility]   Cognition Arousal/Alertness: Awake/alert Behavior  During Therapy: Restless;Impulsive Overall Cognitive  Status: Impaired/Different from baseline Area of Impairment: Attention;Memory;Following commands;Awareness;Safety/judgement                   Current Attention Level: Selective Memory: Decreased recall of precautions;Decreased short-term memory Following Commands: Follows multi-step commands inconsistently Safety/Judgement: Decreased awareness of safety;Decreased awareness of deficits Awareness: Emergent   General Comments: Decreased safety awareness.   General Comments  VSS throughout    Exercises     Shoulder Instructions      Home Living Family/patient expects to be discharged to:: Private residence Living Arrangements: Alone Available Help at Discharge: Other (Comment)(no family available to assist) Type of Home: Mobile home Home Access: Stairs to enter Entrance Stairs-Number of Steps: 4 Entrance Stairs-Rails: Right;Left(unsure if can reach both) Home Layout: One level               Home Equipment: None   Additional Comments: Need to assess bathroom set-up      Prior Functioning/Environment Level of Independence: Independent        Comments: Drives.  Has multiple dogs that he takes care of.        OT Problem List: Decreased strength;Decreased range of motion;Decreased activity tolerance;Impaired balance (sitting and/or standing);Decreased safety awareness;Decreased knowledge of use of DME or AE;Decreased knowledge of precautions;Pain      OT Treatment/Interventions: Self-care/ADL training;Therapeutic exercise;Energy conservation;DME and/or AE instruction;Therapeutic activities;Patient/family education;Balance training    OT Goals(Current goals can be found in the care plan section) Acute Rehab OT Goals Patient Stated Goal: To go home OT Goal Formulation: With patient Time For Goal Achievement: 10/17/17 Potential to Achieve Goals: Good ADL Goals Pt Will Perform Grooming: with modified independence;standing Pt Will Perform Lower Body Dressing:  with modified independence;sit to/from stand Pt Will Transfer to Toilet: with modified independence;ambulating;bedside commode(BSC over toilet) Pt Will Perform Toileting - Clothing Manipulation and hygiene: with modified independence;sit to/from stand Additional ADL Goal #1: Pt will demonstrate anticipatory awareness during morning ADL routine.  OT Frequency: Min 2X/week   Barriers to D/C:            Co-evaluation              AM-PAC PT "6 Clicks" Daily Activity     Outcome Measure Help from another person eating meals?: A Little Help from another person taking care of personal grooming?: A Little Help from another person toileting, which includes using toliet, bedpan, or urinal?: A Lot Help from another person bathing (including washing, rinsing, drying)?: A Lot Help from another person to put on and taking off regular upper body clothing?: A Little Help from another person to put on and taking off regular lower body clothing?: A Lot 6 Click Score: 15   End of Session Equipment Utilized During Treatment: Gait belt;Rolling walker Nurse Communication: Mobility status  Activity Tolerance: Patient tolerated treatment well Patient left: with call bell/phone within reach;in bed;with bed alarm set  OT Visit Diagnosis: Other abnormalities of gait and mobility (R26.89);Muscle weakness (generalized) (M62.81);Other symptoms and signs involving cognitive function;Pain Pain - Right/Left: (abdominal) Pain - part of body: (abdominal)                Time: 1435-1500 OT Time Calculation (min): 25 min Charges:  OT General Charges $OT Visit: 1 Visit OT Evaluation $OT Eval Moderate Complexity: 1 Mod OT Treatments $Self Care/Home Management : 8-22 mins G-Codes:     Norman Herrlich, MS OTR/L  Pager: [place] 10/03/2017, 4:04  PM

## 2017-10-04 ENCOUNTER — Encounter (HOSPITAL_COMMUNITY): Payer: Self-pay

## 2017-10-04 LAB — CBC
HEMATOCRIT: 31.6 % — AB (ref 39.0–52.0)
HEMOGLOBIN: 10.8 g/dL — AB (ref 13.0–17.0)
MCH: 32.1 pg (ref 26.0–34.0)
MCHC: 34.2 g/dL (ref 30.0–36.0)
MCV: 94 fL (ref 78.0–100.0)
Platelets: 120 10*3/uL — ABNORMAL LOW (ref 150–400)
RBC: 3.36 MIL/uL — AB (ref 4.22–5.81)
RDW: 12.6 % (ref 11.5–15.5)
WBC: 8.4 10*3/uL (ref 4.0–10.5)

## 2017-10-04 MED ORDER — PANTOPRAZOLE SODIUM 40 MG PO TBEC
40.0000 mg | DELAYED_RELEASE_TABLET | Freq: Every day | ORAL | Status: DC
  Administered 2017-10-04: 40 mg via ORAL
  Filled 2017-10-04: qty 1

## 2017-10-04 MED ORDER — BISACODYL 10 MG RE SUPP
10.0000 mg | Freq: Every day | RECTAL | Status: DC | PRN
Start: 2017-10-04 — End: 2017-10-05
  Filled 2017-10-04: qty 1

## 2017-10-04 MED ORDER — ENOXAPARIN SODIUM 40 MG/0.4ML ~~LOC~~ SOLN
40.0000 mg | SUBCUTANEOUS | Status: DC
  Administered 2017-10-04: 40 mg via SUBCUTANEOUS
  Filled 2017-10-04: qty 0.4

## 2017-10-04 MED ORDER — AMLODIPINE BESYLATE 5 MG PO TABS
5.0000 mg | ORAL_TABLET | Freq: Every day | ORAL | Status: DC
  Administered 2017-10-04: 5 mg via ORAL
  Filled 2017-10-04: qty 1

## 2017-10-04 MED ORDER — LISINOPRIL 20 MG PO TABS
40.0000 mg | ORAL_TABLET | Freq: Every day | ORAL | Status: DC
  Administered 2017-10-04: 40 mg via ORAL
  Filled 2017-10-04: qty 2

## 2017-10-04 MED ORDER — ATORVASTATIN CALCIUM 10 MG PO TABS
10.0000 mg | ORAL_TABLET | Freq: Every day | ORAL | Status: DC
  Administered 2017-10-04: 10 mg via ORAL
  Filled 2017-10-04: qty 1

## 2017-10-04 NOTE — Progress Notes (Signed)
Collins, PA paged; pt SBP >160- home BP meds ordered; 5mg  Norvasc, 40mg  Lisinopril

## 2017-10-04 NOTE — Care Management Note (Signed)
Case Management Note  Patient Details  Name: [name] [name] MRN: [id number] Date of Birth: [birth year]  Subjective/Objective:  From home alone, POD 3 s/p Aorta Bifemoral Bypass Graft with B CFA.  He states he is indep at home, he is refusing SNF, states he does not need HH, he has people that he can call to come and check on him, states his brother will be the first person he will call.  He has PCP and medication coverage.  NCM informed him we will continue to follow to see what he needs will be at dc.                   Action/Plan: NCM will follow for dc needs.   Expected Discharge Date:  10/08/17               Expected Discharge Plan:  Skilled Nursing Facility  In-House Referral:  Clinical Social Work  Discharge planning Services  CM Consult  Post Acute Care Choice:    Choice offered to:     DME Arranged:    DME Agency:     HH Arranged:    [place] Agency:     Status of Service:  In process, will continue to follow  If discussed at Long Length of Stay Meetings, dates discussed:    Additional Comments:  Zenon Mayo, RN 10/04/2017, 11:20 AM

## 2017-10-04 NOTE — Progress Notes (Signed)
CSW met with pt to discuss PT recommendation for SNF.  Patient adamantly against SNF at this time- feels as if he is at baseline functioning and that he can manage at home.  Feels strongly about returning home to take care of his animals.    CSW inquired about available assistance- he states he has people he can call if he needs help but feels confident about returning home by himself.  CSW inquired about home health services/equipment and pt does not think he is in need of these services  CSW updated [place] signing off  Jorge Ny, [place] Social Worker [phone]

## 2017-10-04 NOTE — Progress Notes (Addendum)
[facility] [place], wants to get out of bed more.   Objective (!) 154/78 65 98.6 F (37 C) (Oral) 15 100%  Intake/Output Summary (Last 24 hours) at 10/04/2017 0752 Last data filed at 10/04/2017 0725 Gross per 24 hour  Intake 1710 ml  Output 2100 ml  Net -390 ml    Palpable AT B Abdomin soft, incision healing well Groins soft, healing well Heart RRR Lungs non labored breathing  Assessment/Planning: 66 y.o. male is s/p ABF with B CFA endarterectomies and profundoplasty 3 Days Post-Op  D/C central line OOB as tolerates with supervision Dulcolax supp. Tolerating PO's no N/V Transfer to 4E tel. Pharmacy has started Lovenox for DVT prophylaxis and I have started Lipitor 10 mg daily.    Roxy Horseman 10/04/2017 7:52 AM --  Laboratory Lab Results: Recent Labs    10/02/17 1000 10/04/17 0417  WBC 11.4* 8.4  HGB 12.2* 10.8*  HCT 36.4* 31.6*  PLT 136* 120*   BMET Recent Labs    10/02/17 1000  NA 133*  K 4.3  CL 100*  CO2 28  GLUCOSE 187*  BUN 17  CREATININE 0.65  CALCIUM 8.7*    COAG Lab Results  Component Value Date   INR 1.19 09/30/2017   INR 1.04 09/26/2017   No results found for: PTT  I have examined the patient, reviewed and agree with above.  Doing well.  Asking to go home.  Explained will need to have bowel movement and be more mobile.  Plan discharge tomorrow  Curt Jews, MD 10/04/2017 8:37 AM
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
## 2017-10-05 ENCOUNTER — Encounter (HOSPITAL_COMMUNITY): Payer: Self-pay | Admitting: *Deleted

## 2017-10-05 MED ORDER — ATORVASTATIN CALCIUM 10 MG PO TABS: 10.0000 mg | ORAL_TABLET | Freq: Every day | ORAL | 1 refills | Status: DC

## 2017-10-05 MED ORDER — OXYCODONE HCL 5 MG PO TABS: 5.0000 mg | ORAL_TABLET | Freq: Four times a day (QID) | ORAL | 0 refills | Status: DC | PRN
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
## 2017-10-05 NOTE — Progress Notes (Signed)
[facility]  Subjective  - Wants to go home, does not want HH or SNF.  He also declined walker or 4 prong cane for assistants.   Objective (!) 154/72 62 98.7 F (37.1 C) (Oral) 20 91%  Intake/Output Summary (Last 24 hours) at 10/05/2017 1423 Last data filed at 10/04/2017 1900 Gross per 24 hour  Intake 825 ml  Output 600 ml  Net 225 ml    Palpable AT B Groins soft healing well, Abdomin soft healing well Sit stand and ambulated in the room with stand by assistance. Lungs non labored breathing   Assessment/Planning: 66 y.o.maleis s/p ABF with B CFA endarterectomies and profundoplasty4 Days Post-Op positive BM Home later today Lipitor and percocet F/U with Dr. Donnetta Hutching in 2 weeks    Roxy Horseman 10/05/2017 8:12 AM --  Laboratory Lab Results: Recent Labs    10/02/17 1000 10/04/17 0417  WBC 11.4* 8.4  HGB 12.2* 10.8*  HCT 36.4* 31.6*  PLT 136* 120*   BMET Recent Labs    10/02/17 1000  NA 133*  K 4.3  CL 100*  CO2 28  GLUCOSE 187*  BUN 17  CREATININE 0.65  CALCIUM 8.7*    COAG Lab Results  Component Value Date   INR 1.19 09/30/2017   INR 1.04 09/26/2017   No results found for: PTT

## 2017-10-05 NOTE — Plan of Care (Signed)
Patient states he needs to go home. Does not like the food, but eating so he can go home. He declines a walker or cane at home.  Ambulates well with no deficits. Can sit up move around . Instructed on not staying in bed , moving around at home. Patient stating he will not smoke at home.

## 2017-10-05 NOTE — Progress Notes (Signed)
Patient wishes to "get out of here"  Insists on driving home.  Can ambulate without assistance, sit in chair, Had stool last evening. Ate pancakes and cereal this morning. IV line out. Hemostasis achieved. Discharged via wheelchair. Patietn stated he had all his belongings. After d/c there was a phone and charger left. Patient was called. Patient returned call  And is aware his phone is here.
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
## 2017-10-06 ENCOUNTER — Telehealth: Payer: Self-pay | Admitting: Vascular Surgery

## 2017-10-06 NOTE — Telephone Encounter (Signed)
-----   Message from Mena Goes, RN sent at 10/05/2017 10:40 AM EST ----- Regarding: 2-3 weeks   ----- Message ----- From: Ulyses Amor, PA-C Sent: 10/05/2017   8:21 AM To: [facility] Charge Pool  S/P aorto by fem bypass f/u with Dr. Donnetta Hutching in 2-3 weeks

## 2017-10-06 NOTE — Telephone Encounter (Signed)
Sched appt 11/15/16 at 11:45. Spoke to pt.

## 2017-11-15 ENCOUNTER — Encounter: Payer: Self-pay | Admitting: Vascular Surgery

## 2017-11-15 ENCOUNTER — Ambulatory Visit (INDEPENDENT_AMBULATORY_CARE_PROVIDER_SITE_OTHER): Payer: Self-pay | Admitting: Vascular Surgery

## 2017-11-15 VITALS — BP 138/84 | HR 67 | Temp 97.5°F | Resp 20 | Ht 69.5 in | Wt 148.8 lb

## 2017-11-15 DIAGNOSIS — I739 Peripheral vascular disease, unspecified: Secondary | ICD-10-CM

## 2017-11-15 NOTE — Progress Notes (Signed)
Patient name: [name] [name] MRN: [id number] DOB: August 07, 1951 Sex: male  REASON FOR VISIT: Follow-up of aortobifemoral bypass and bilateral common femoral endarterectomy on 09/30/2017  HPI: [name] [name] is a 67 y.o. male here today for follow-up.  He did well in the hospital with no complications and was discharged home.  He did report the usual diminished appetite and bowel function initially but feels that he is regaining the weight he lost around the time of surgery.  He has not reached his normal baseline from stamina but improving with this as well.  He is quite pleased with his result and has had complete resolution of his limiting claudication.  Current Outpatient Medications  Medication Sig Dispense Refill  . amLODipine (NORVASC) 5 MG tablet Take 5 mg by mouth every evening.     Marland Kitchen aspirin EC 81 MG tablet Take 81 mg by mouth daily.    Marland Kitchen atenolol (TENORMIN) 50 MG tablet Take 50 mg by mouth every evening.     Marland Kitchen atorvastatin (LIPITOR) 10 MG tablet Take 1 tablet (10 mg total) by mouth daily at 6 PM. 30 tablet 1  . Cholecalciferol (VITAMIN D3) 1000 units CAPS Take 1,000 Units by mouth daily. TAKES 4-5 TIMES WEEK    . Coenzyme Q10 (GNP CO Q10 PO) Take 1 capsule by mouth daily. GUMMIES TAKES 4-5 TIMES WEEK    . Cyanocobalamin (VITAMIN B-12 PO) Take 1 tablet by mouth daily. TAKES 4-5 TIMES WEEK    . divalproex (DEPAKOTE) 500 MG EC tablet Take 500 mg by mouth 2 (two) times daily.      Marland Kitchen ibuprofen (ADVIL,MOTRIN) 200 MG tablet Take 400 mg by mouth every 4 (four) hours as needed for moderate pain.    Marland Kitchen lisinopril (PRINIVIL,ZESTRIL) 40 MG tablet Take 40 mg by mouth every evening.   5  . Magnesium 250 MG TABS Take 250 mg by mouth daily.     . Omega-3 Krill Oil 1000 MG CAPS Take 1 capsule by mouth 2 (two) times daily. TAKES 4-5 TIMES WEEK    . oxyCODONE (OXY IR/ROXICODONE) 5 MG immediate release tablet Take 0.5-2 tablets (2.5-10 mg total) by mouth every 6 (six)  hours as needed for severe pain or breakthrough pain. (Patient taking differently: Take 2.5-5 mg by mouth every 6 (six) hours as needed for severe pain or breakthrough pain. ) 40 tablet 0  . oxyCODONE (ROXICODONE) 5 MG immediate release tablet Take 1 tablet (5 mg total) by mouth every 6 (six) hours as needed. 20 tablet 0   No current facility-administered medications for this visit.      PHYSICAL EXAM: Vitals:   11/15/17 1153  BP: 138/84  Pulse: 67  Resp: 20  Temp: (!) 97.5 F (36.4 C)  TempSrc: Oral  SpO2: 100%  Weight: 148 lb 12.8 oz (67.5 kg)  Height: 5' 9.5" (1.765 m)    GENERAL: The patient is a well-nourished male, in no acute distress. The vital signs are documented above. Abdominal and groin wound abdominal hernia.  2-3+ femoral pulses and 2+ posterior tibial pulses bilaterally  MEDICAL ISSUES: Stable initial follow-up after aortobifemoral bypass and bilateral femoral arteries.  We will continue his usual activities.  We will restrict heavy lifting or straining for a total of 3 months after the surgery and then no limitations.  We will see him again in 9 months with ankle arm indices follow-up   Rosetta Posner, MD FACS [facility] [phone] Pager [phone] 

## 2017-12-19 DIAGNOSIS — J449 Chronic obstructive pulmonary disease, unspecified: Secondary | ICD-10-CM | POA: Diagnosis not present

## 2017-12-19 DIAGNOSIS — I739 Peripheral vascular disease, unspecified: Secondary | ICD-10-CM | POA: Diagnosis not present

## 2017-12-19 DIAGNOSIS — E785 Hyperlipidemia, unspecified: Secondary | ICD-10-CM | POA: Diagnosis not present

## 2017-12-19 DIAGNOSIS — I10 Essential (primary) hypertension: Secondary | ICD-10-CM | POA: Diagnosis not present

## 2018-08-01 ENCOUNTER — Other Ambulatory Visit: Payer: Self-pay

## 2018-08-01 DIAGNOSIS — I739 Peripheral vascular disease, unspecified: Secondary | ICD-10-CM

## 2018-08-04 DIAGNOSIS — I10 Essential (primary) hypertension: Secondary | ICD-10-CM | POA: Diagnosis not present

## 2018-08-04 DIAGNOSIS — J449 Chronic obstructive pulmonary disease, unspecified: Secondary | ICD-10-CM | POA: Diagnosis not present

## 2018-08-04 DIAGNOSIS — I739 Peripheral vascular disease, unspecified: Secondary | ICD-10-CM | POA: Diagnosis not present

## 2018-08-15 ENCOUNTER — Ambulatory Visit (HOSPITAL_COMMUNITY)
Admission: RE | Admit: 2018-08-15 | Discharge: 2018-08-15 | Disposition: A | Discharge status: Home / Self Care | Payer: Medicare Other | Source: Ambulatory Visit | Attending: Vascular Surgery | Admitting: Vascular Surgery

## 2018-08-15 ENCOUNTER — Ambulatory Visit (INDEPENDENT_AMBULATORY_CARE_PROVIDER_SITE_OTHER): Payer: Medicare Other | Admitting: Vascular Surgery

## 2018-08-15 ENCOUNTER — Encounter: Payer: Self-pay | Admitting: Vascular Surgery

## 2018-08-15 VITALS — BP 161/82 | HR 58 | Temp 97.2°F | Resp 18 | Ht 69.5 in | Wt 156.7 lb

## 2018-08-15 DIAGNOSIS — I739 Peripheral vascular disease, unspecified: Secondary | ICD-10-CM | POA: Diagnosis not present
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
## 2018-08-30 ENCOUNTER — Ambulatory Visit (INDEPENDENT_AMBULATORY_CARE_PROVIDER_SITE_OTHER): Payer: Medicare Other | Admitting: Acute Care

## 2018-08-30 ENCOUNTER — Encounter: Payer: Self-pay | Admitting: Acute Care

## 2018-08-30 ENCOUNTER — Telehealth: Payer: Self-pay | Admitting: Acute Care

## 2018-08-30 ENCOUNTER — Ambulatory Visit (INDEPENDENT_AMBULATORY_CARE_PROVIDER_SITE_OTHER)
Admission: RE | Admit: 2018-08-30 | Discharge: 2018-08-30 | Disposition: A | Discharge status: Home / Self Care | Payer: Medicare Other | Source: Ambulatory Visit | Attending: Acute Care | Admitting: Acute Care

## 2018-08-30 DIAGNOSIS — Z87891 Personal history of nicotine dependence: Secondary | ICD-10-CM | POA: Diagnosis not present

## 2018-08-30 DIAGNOSIS — F17210 Nicotine dependence, cigarettes, uncomplicated: Secondary | ICD-10-CM | POA: Diagnosis not present

## 2018-08-30 DIAGNOSIS — Z720 Tobacco use: Secondary | ICD-10-CM

## 2018-08-30 DIAGNOSIS — R918 Other nonspecific abnormal finding of lung field: Secondary | ICD-10-CM

## 2018-08-30 NOTE — Telephone Encounter (Signed)
Received call report from [place] with [facility] on patient's CT chest lung screening done on 08/29/18.  [name] [name] please review the result/impression copied below: IMPRESSION: 1. Lung-RADS 4B, suspicious. Dominant irregular solid pulmonary nodule in the medial basilar right lower lobe with 9.0 mm volume derived mean diameter, mildly increased in size since 01/28/2017 chest CT. Additional imaging evaluation or consultation with Pulmonology or Thoracic Surgery recommended.   Will route to Thurman Coyer, NP  Please advise, thank you.

## 2018-08-30 NOTE — Addendum Note (Signed)
Addended by: Eric Form F on: 08/30/2018 02:11 PM   Modules accepted: Orders

## 2018-08-30 NOTE — Progress Notes (Signed)
Shared Decision Making Visit Lung Cancer Screening Program [id number])   Eligibility:  Age 67 y.o.  Pack Years Smoking History Calculation 75 pack year smoking history (# packs/per year x # years smoked)  Recent History of coughing up blood  no  Unexplained weight loss? no ( >Than 15 pounds within the last 6 months )  Prior History Lung / other cancer no (Diagnosis within the last 5 years already requiring surveillance chest CT Scans).  Smoking Status Current Smoker  Former Smokers: Years since quit:   Quit Date: NA  Visit Components:  Discussion included one or more decision making aids. yes  Discussion included risk/benefits of screening. yes  Discussion included potential follow up diagnostic testing for abnormal scans. yes  Discussion included meaning and risk of over diagnosis. yes  Discussion included meaning and risk of False Positives. yes  Discussion included meaning of total radiation exposure. yes  Counseling Included:  Importance of adherence to annual lung cancer LDCT screening. yes  Impact of comorbidities on ability to participate in the program. yes  Ability and willingness to under diagnostic treatment. yes  Smoking Cessation Counseling:  Current Smokers:   Discussed importance of smoking cessation. Yes  Information about tobacco cessation classes and interventions provided to patient. yes  Patient provided with "ticket" for LDCT Scan. yes  Symptomatic Patient. no  Counseling  Diagnosis Code: Tobacco Use Z72.0  Asymptomatic Patient yes  Counseling (Intermediate counseling: > three minutes counseling) [id number]  Former Smokers:   Discussed the importance of maintaining cigarette abstinence. yes  Diagnosis Code: Personal History of Nicotine Dependence. G64.403  Information about tobacco cessation classes and interventions provided to patient. Yes  Patient provided with "ticket" for LDCT Scan. yes  Written Order for Lung Cancer Screening  with LDCT placed in Epic. Yes (CT Chest Lung Cancer Screening Low Dose W/O CM) [id number] Z12.2-Screening of respiratory organs Z87.891-Personal history of nicotine dependence  I have spent 25 minutes of face to face time with Mr. Lessley discussing the risks and benefits of lung cancer screening. We viewed a power point together that explained in detail the above noted topics. We paused at intervals to allow for questions to be asked and answered to ensure understanding.We discussed that the single most powerful action that he can take to decrease his  risk of developing lung cancer is to quit smoking. We discussed whether or not he is ready to commit to setting a quit date. We discussed options for tools to aid in quitting smoking including nicotine replacement therapy, non-nicotine medications, support groups, Quit Smart classes, and behavior modification. We discussed that often times setting smaller, more achievable goals, such as eliminating 1 cigarette a day for a week and then 2 cigarettes a day for a week can be helpful in slowly decreasing the number of cigarettes smoked. This allows for a sense of accomplishment as well as providing a clinical benefit. I gave him the " Be Stronger Than Your Excuses" card with contact information for community resources, classes, free nicotine replacement therapy, and access to mobile apps, text messaging, and on-line smoking cessation help. I have also given him my card and contact information in the event he needs to contact me. We discussed the time and location of the scan, and that either Doroteo Glassman RN or I will call with the results within 24-48 hours of receiving them. I have offered him  a copy of the power point we viewed  as a resource in the event they need reinforcement  of the concepts we discussed today in the office. The patient verbalized understanding of all of  the above and had no further questions upon leaving the office. They have my contact  information in the event they have any further questions.  I spent 3 minutes counseling on smoking cessation and the health risks of continued tobacco abuse.  I explained to the patient that there has been a high incidence of coronary artery disease noted on these exams. I explained that this is a non-gated exam therefore degree or severity cannot be determined. This patient is not on statin therapy. I have asked the patient to follow-up with their PCP regarding any incidental finding of coronary artery disease and management with diet or medication as their PCP  feels is clinically indicated. The patient verbalized understanding of the above and had no further questions upon completion of the visit.      Magdalen Spatz, NP 08/30/2018 9:51 AM

## 2018-09-06 ENCOUNTER — Ambulatory Visit (HOSPITAL_COMMUNITY)
Admission: RE | Admit: 2018-09-06 | Discharge: 2018-09-06 | Disposition: A | Discharge status: Home / Self Care | Payer: Medicare Other | Source: Ambulatory Visit | Attending: Acute Care | Admitting: Acute Care

## 2018-09-06 DIAGNOSIS — R918 Other nonspecific abnormal finding of lung field: Secondary | ICD-10-CM

## 2018-09-06 DIAGNOSIS — F17210 Nicotine dependence, cigarettes, uncomplicated: Secondary | ICD-10-CM

## 2018-09-06 DIAGNOSIS — R911 Solitary pulmonary nodule: Secondary | ICD-10-CM | POA: Diagnosis not present

## 2018-09-06 DIAGNOSIS — Z720 Tobacco use: Secondary | ICD-10-CM

## 2018-09-06 LAB — PULMONARY FUNCTION TEST
DL/VA % pred: 58 %
DL/VA: 2.71 ml/min/mmHg/L
DLCO unc % pred: 51 %
DLCO unc: 16.6 ml/min/mmHg
FEF 25-75 Post: 0.95 L/sec
FEF 25-75 Pre: 0.49 L/sec
FEF2575-%CHANGE-POST: 92 %
FEF2575-%Pred-Post: 37 %
FEF2575-%Pred-Pre: 19 %
FEV1-%Change-Post: 26 %
FEV1-%PRED-POST: 49 %
FEV1-%Pred-Pre: 39 %
FEV1-PRE: 1.3 L
FEV1-Post: 1.63 L
FEV1FVC-%CHANGE-POST: -5 %
FEV1FVC-%Pred-Pre: 64 %
FEV6-%Change-Post: 25 %
FEV6-%PRED-PRE: 57 %
FEV6-%Pred-Post: 72 %
FEV6-PRE: 2.44 L
FEV6-Post: 3.08 L
FEV6FVC-%Change-Post: -5 %
FEV6FVC-%PRED-PRE: 94 %
FEV6FVC-%Pred-Post: 89 %
FVC-%Change-Post: 33 %
FVC-%PRED-PRE: 61 %
FVC-%Pred-Post: 81 %
FVC-POST: 3.65 L
FVC-PRE: 2.74 L
PRE FEV6/FVC RATIO: 89 %
Post FEV1/FVC ratio: 45 %
Post FEV6/FVC ratio: 84 %
Pre FEV1/FVC ratio: 47 %
RV % PRED: 232 %
RV: 5.65 L
TLC % pred: 120 %
TLC: 8.46 L

## 2018-09-06 LAB — GLUCOSE, CAPILLARY: GLUCOSE-CAPILLARY: 125 mg/dL — AB (ref 70–99)

## 2018-09-06 MED ORDER — FLUDEOXYGLUCOSE F - 18 (FDG) INJECTION
7.7800 | Freq: Once | INTRAVENOUS | Status: AC | PRN
  Administered 2018-09-06: 7.78 via INTRAVENOUS

## 2018-09-06 MED ORDER — ALBUTEROL SULFATE (2.5 MG/3ML) 0.083% IN NEBU
2.5000 mg | INHALATION_SOLUTION | Freq: Once | RESPIRATORY_TRACT | Status: AC
  Administered 2018-09-06: 2.5 mg via RESPIRATORY_TRACT

## 2018-09-11 NOTE — Telephone Encounter (Signed)
Per result note 08/30/18, Maggie Schwalbe called and spoke with pt regarding Chest CT results.  Per result note 09/06/18, Eric Form, NP , left message for pt to call regarding PET scan results.  Today I called and spoke with pt regarding PET scan results per Eric Form, NP.  appt scheduled with Judson Roch 09/18/18 11:00 to discuss results.  Pt verbalized understanding .  Nothing further needed.

## 2018-09-11 NOTE — Telephone Encounter (Signed)
Patient returned phone call; pt contact # [phone]

## 2018-09-11 NOTE — Telephone Encounter (Signed)
[name] [name] did you call the pt about results?

## 2018-09-18 ENCOUNTER — Encounter: Payer: Self-pay | Admitting: Acute Care

## 2018-09-18 ENCOUNTER — Ambulatory Visit: Payer: Medicare Other | Admitting: Acute Care

## 2018-09-18 DIAGNOSIS — F17210 Nicotine dependence, cigarettes, uncomplicated: Secondary | ICD-10-CM

## 2018-09-18 DIAGNOSIS — R911 Solitary pulmonary nodule: Secondary | ICD-10-CM

## 2018-09-18 DIAGNOSIS — J449 Chronic obstructive pulmonary disease, unspecified: Secondary | ICD-10-CM | POA: Diagnosis not present

## 2018-09-18 MED ORDER — BUDESONIDE-FORMOTEROL FUMARATE 80-4.5 MCG/ACT IN AERO: 2.0000 | INHALATION_SPRAY | Freq: Two times a day (BID) | RESPIRATORY_TRACT | 0 refills | Status: DC
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
## 2018-09-18 NOTE — Assessment & Plan Note (Addendum)
LDCT 10/30 2019 Lung-RADS 4B, suspicious. Dominant irregular solid pulmonary nodule in the medial basilar right lower lobe with 9.0 mm volume derived mean diameter, mildly increased in size since 01/28/2017 chest CT. Additional imaging evaluation or consultation with Pulmonology or Thoracic Surgery recommended. 2. Three-vessel coronary atherosclerosis. Aortic Atherosclerosis  Plan: We will do a follow up CT Lung Cancer Screening in 3 months to follow the pulmonary nodule. Please try to quit smoking as this is the single most powerful action you can take to decrease your risk  of lung cancer and worsening pulmonary disease. Please contact office for sooner follow up if symptoms do not improve or worsen or seek emergency care

## 2018-09-18 NOTE — Assessment & Plan Note (Addendum)
Severe obstruction per PFT's Currently not taking any inhalers Plan: We will do a therapeutic trial of Symbicort  Use this 2 puffs twice daily every day. Rinse mouth after use. Follow up in one month to evaluate effectiveness.

## 2018-09-18 NOTE — Patient Instructions (Addendum)
It is good to meet you today. We will do a therapeutic trial of Symbicort  Use this 2 puffs twice daily every day. Rinse mouth after use. We will do a follow up CT Lung Cancer Screening in 3 months to follow the pulmonary nodule. Please try to quit smoking as this is the single most powerful action you can take to decrease your risk  of lung cancer and worsening pulmonary disease. Follow up in one  month to assess for effectiveness of Symbicort. Please contact office for sooner follow up if symptoms do not improve or worsen or seek emergency care

## 2018-09-20 ENCOUNTER — Other Ambulatory Visit: Payer: Self-pay | Admitting: Acute Care

## 2018-09-20 DIAGNOSIS — Z122 Encounter for screening for malignant neoplasm of respiratory organs: Secondary | ICD-10-CM

## 2018-09-20 DIAGNOSIS — Z87891 Personal history of nicotine dependence: Secondary | ICD-10-CM

## 2018-09-20 DIAGNOSIS — F17210 Nicotine dependence, cigarettes, uncomplicated: Principal | ICD-10-CM

## 2018-10-18 ENCOUNTER — Ambulatory Visit (INDEPENDENT_AMBULATORY_CARE_PROVIDER_SITE_OTHER): Payer: Medicare Other | Admitting: Acute Care

## 2018-10-18 ENCOUNTER — Encounter: Payer: Self-pay | Admitting: Acute Care

## 2018-10-18 DIAGNOSIS — F17210 Nicotine dependence, cigarettes, uncomplicated: Secondary | ICD-10-CM

## 2018-10-18 DIAGNOSIS — J449 Chronic obstructive pulmonary disease, unspecified: Secondary | ICD-10-CM

## 2018-10-18 DIAGNOSIS — R911 Solitary pulmonary nodule: Secondary | ICD-10-CM

## 2018-10-18 MED ORDER — BUDESONIDE-FORMOTEROL FUMARATE 80-4.5 MCG/ACT IN AERO: 2.0000 | INHALATION_SPRAY | Freq: Two times a day (BID) | RESPIRATORY_TRACT | 5 refills | Status: DC
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
## 2018-10-18 NOTE — Patient Instructions (Signed)
We will send in a prescription for Symbicort 2 puffs twice daily Remember to rinse mouth after use. We will give you another sample today. We need to schedule a repeat CT chest 12/2018. Please assign to Dr. Valeta Harms as primary pulmonologist Follow up with Dr. Valeta Harms after CT chest 12/2018.  Note your daily symptoms > remember "red flags" for COPD:  Increase in cough, increase in sputum production, increase in shortness of breath or activity intolerance. If you notice these symptoms, please call to be seen.   Please contact office for sooner follow up if symptoms do not improve or worsen or seek emergency care

## 2018-10-18 NOTE — Assessment & Plan Note (Signed)
Non-PET avid No hemoptysis Plan Follow up CT chest in 12/2018 and follow up with Dr. Valeta Harms after repeat CT Continued close monitoring

## 2018-10-18 NOTE — Addendum Note (Signed)
Addended by: Jannette Spanner on: 10/18/2018 03:08 PM   Modules accepted: Orders

## 2018-10-18 NOTE — Assessment & Plan Note (Signed)
Likes symbicort>> less dyspnea Is worries about cost Plan We will send in a prescription for Symbicort 2 puffs twice daily Remember to rinse mouth after use. We will give you another sample today. Please assign to Dr. Valeta Harms as primary pulmonologist Follow up with Dr. Valeta Harms after CT chest 12/2018.  Note your daily symptoms > remember "red flags" for COPD:  Increase in cough, increase in sputum production, increase in shortness of breath or activity intolerance. If you notice these symptoms, please call to be seen.   Please contact office for sooner follow up if symptoms do not improve or worsen or seek emergency care

## 2018-10-18 NOTE — Progress Notes (Signed)
PCCM: Thanks. I will be glad to see.  Garner Nash, DO [facility] 10/18/2018 5:15 PM

## 2018-11-09 DIAGNOSIS — Z0000 Encounter for general adult medical examination without abnormal findings: Secondary | ICD-10-CM | POA: Diagnosis not present

## 2018-11-09 DIAGNOSIS — I10 Essential (primary) hypertension: Secondary | ICD-10-CM | POA: Diagnosis not present

## 2018-11-09 DIAGNOSIS — J449 Chronic obstructive pulmonary disease, unspecified: Secondary | ICD-10-CM | POA: Diagnosis not present

## 2018-11-09 DIAGNOSIS — Z23 Encounter for immunization: Secondary | ICD-10-CM | POA: Diagnosis not present

## 2018-11-09 DIAGNOSIS — M542 Cervicalgia: Secondary | ICD-10-CM | POA: Diagnosis not present

## 2018-11-09 DIAGNOSIS — E785 Hyperlipidemia, unspecified: Secondary | ICD-10-CM | POA: Diagnosis not present

## 2018-11-24 DIAGNOSIS — Z1212 Encounter for screening for malignant neoplasm of rectum: Secondary | ICD-10-CM | POA: Diagnosis not present

## 2018-11-24 DIAGNOSIS — Z1211 Encounter for screening for malignant neoplasm of colon: Secondary | ICD-10-CM | POA: Diagnosis not present

## 2018-12-07 ENCOUNTER — Inpatient Hospital Stay: Admission: RE | Admit: 2018-12-07 | Payer: Self-pay | Source: Ambulatory Visit

## 2019-01-17 ENCOUNTER — Ambulatory Visit: Payer: Self-pay | Admitting: Pulmonary Disease

## 2019-05-18 ENCOUNTER — Other Ambulatory Visit: Payer: Self-pay

## 2019-05-18 NOTE — Patient Outreach (Signed)
[facility] [facility]) Care Management  05/18/2019  [name] [name] 11/04/1950 [id number]   Medication Adherence call to [name] [name] HIPPA Compliant Voice message left with a call back number. [name] [name] is showing past due on Atorvastatin 10 mg and Lisinopril 40 mg under [place].   [place] Management Direct Dial [phone]  Fax [phone] [name]

## 2019-07-10 ENCOUNTER — Other Ambulatory Visit: Payer: Self-pay

## 2019-07-10 NOTE — Patient Outreach (Signed)
[facility]) Care Management  07/10/2019  KERMITH MANTHEY [birth year] [id number]   Medication Adherence call to Mr. Reign [place] Telephone call to Patient regarding Medication Adherence unable to reach patient. Mr. Trotti is showing past due on Atorvastatin 10 mg under [place].   [place] Management Direct Dial [phone]  Fax [phone] [email or web address]

## 2019-08-01 ENCOUNTER — Encounter: Payer: Self-pay | Admitting: Internal Medicine

## 2019-09-20 DIAGNOSIS — H353210 Exudative age-related macular degeneration, right eye, stage unspecified: Secondary | ICD-10-CM | POA: Diagnosis not present

## 2019-09-26 DIAGNOSIS — H31091 Other chorioretinal scars, right eye: Secondary | ICD-10-CM | POA: Diagnosis not present

## 2019-09-26 DIAGNOSIS — H35051 Retinal neovascularization, unspecified, right eye: Secondary | ICD-10-CM | POA: Diagnosis not present

## 2019-09-26 DIAGNOSIS — H35371 Puckering of macula, right eye: Secondary | ICD-10-CM | POA: Diagnosis not present

## 2019-09-26 DIAGNOSIS — H35432 Paving stone degeneration of retina, left eye: Secondary | ICD-10-CM | POA: Diagnosis not present

## 2019-10-29 DIAGNOSIS — H35051 Retinal neovascularization, unspecified, right eye: Secondary | ICD-10-CM | POA: Diagnosis not present

## 2019-11-29 DIAGNOSIS — H43822 Vitreomacular adhesion, left eye: Secondary | ICD-10-CM | POA: Diagnosis not present

## 2019-11-29 DIAGNOSIS — H31091 Other chorioretinal scars, right eye: Secondary | ICD-10-CM | POA: Diagnosis not present

## 2019-11-29 DIAGNOSIS — H35051 Retinal neovascularization, unspecified, right eye: Secondary | ICD-10-CM | POA: Diagnosis not present

## 2019-11-29 DIAGNOSIS — H35371 Puckering of macula, right eye: Secondary | ICD-10-CM | POA: Diagnosis not present

## 2019-12-04 DIAGNOSIS — G8929 Other chronic pain: Secondary | ICD-10-CM | POA: Diagnosis not present

## 2019-12-04 DIAGNOSIS — R5383 Other fatigue: Secondary | ICD-10-CM | POA: Diagnosis not present

## 2019-12-04 DIAGNOSIS — M129 Arthropathy, unspecified: Secondary | ICD-10-CM | POA: Diagnosis not present

## 2019-12-04 DIAGNOSIS — Z79899 Other long term (current) drug therapy: Secondary | ICD-10-CM | POA: Diagnosis not present

## 2019-12-04 DIAGNOSIS — M545 Low back pain: Secondary | ICD-10-CM | POA: Diagnosis not present

## 2019-12-19 ENCOUNTER — Other Ambulatory Visit: Payer: Self-pay | Admitting: *Deleted

## 2019-12-19 DIAGNOSIS — F17210 Nicotine dependence, cigarettes, uncomplicated: Secondary | ICD-10-CM

## 2019-12-19 DIAGNOSIS — Z87891 Personal history of nicotine dependence: Secondary | ICD-10-CM

## 2019-12-27 DIAGNOSIS — H35051 Retinal neovascularization, unspecified, right eye: Secondary | ICD-10-CM | POA: Diagnosis not present

## 2020-01-07 ENCOUNTER — Ambulatory Visit: Payer: Medicare Other | Attending: Internal Medicine

## 2020-01-07 DIAGNOSIS — Z23 Encounter for immunization: Secondary | ICD-10-CM | POA: Insufficient documentation

## 2020-01-07 NOTE — Progress Notes (Signed)
   Covid-19 Vaccination Clinic  Name:  [name] [name]    MRN: [id number] DOB: 04/15/1951  01/07/2020  Mr. [name] [name] was observed post Covid-19 immunization for 15 minutes without incident. He was provided with Vaccine Information Sheet and instruction to access the V-Safe system.   Mr. [name] [name] was instructed to call 911 with any severe reactions post vaccine: Marland Kitchen Difficulty breathing  . Swelling of face and throat  . A fast heartbeat  . A bad rash all over body  . Dizziness and weakness   Immunizations Administered    Name Date Dose VIS Date Route   Pfizer COVID-19 Vaccine 01/07/2020  9:41 AM 0.3 mL 10/12/2019 Intramuscular   Manufacturer: [place]   Lot: [id number]   [place]: [id number]

## 2020-01-24 DIAGNOSIS — H35362 Drusen (degenerative) of macula, left eye: Secondary | ICD-10-CM | POA: Diagnosis not present

## 2020-01-24 DIAGNOSIS — H35051 Retinal neovascularization, unspecified, right eye: Secondary | ICD-10-CM | POA: Diagnosis not present

## 2020-01-24 DIAGNOSIS — H35371 Puckering of macula, right eye: Secondary | ICD-10-CM | POA: Diagnosis not present

## 2020-02-06 ENCOUNTER — Ambulatory Visit: Payer: Medicare Other | Attending: Internal Medicine

## 2020-02-06 DIAGNOSIS — Z23 Encounter for immunization: Secondary | ICD-10-CM

## 2020-02-06 NOTE — Progress Notes (Signed)
   Covid-19 Vaccination Clinic  Name:  [name] [name]    MRN: [id number] DOB: 1951/04/06  02/06/2020  Mr. [name] [name] was observed post Covid-19 immunization for 15 minutes without incident. He was provided with Vaccine Information Sheet and instruction to access the V-Safe system.   Mr. [name] [name] was instructed to call 911 with any severe reactions post vaccine: Marland Kitchen Difficulty breathing  . Swelling of face and throat  . A fast heartbeat  . A bad rash all over body  . Dizziness and weakness   Immunizations Administered    Name Date Dose VIS Date Route   Pfizer COVID-19 Vaccine 02/06/2020 11:00 AM 0.3 mL 10/12/2019 Intramuscular   Manufacturer: [place]   Lot: [id number]   [place]: [id number]

## 2020-03-14 ENCOUNTER — Telehealth: Payer: Self-pay | Admitting: Acute Care

## 2020-03-17 ENCOUNTER — Ambulatory Visit (HOSPITAL_COMMUNITY): Admission: RE | Admit: 2020-03-17 | Payer: Medicare Other | Source: Ambulatory Visit

## 2020-03-17 NOTE — Telephone Encounter (Signed)
[name] [name], can you contact to to get his CT rescheduled?

## 2020-03-17 NOTE — Telephone Encounter (Signed)
I have spoken with [name] [name] and his LCS Ct has been rescheduled for 04/04/20 @ 10:00am and his follow up appt with Judson Roch has been rescheduled to 04/14/2020 @ 11:00am

## 2020-03-26 ENCOUNTER — Ambulatory Visit: Payer: Medicare Other | Admitting: Acute Care

## 2020-04-04 ENCOUNTER — Ambulatory Visit (HOSPITAL_COMMUNITY): Admission: RE | Admit: 2020-04-04 | Payer: Medicare Other | Source: Ambulatory Visit

## 2020-04-14 ENCOUNTER — Other Ambulatory Visit: Payer: Self-pay

## 2020-04-14 ENCOUNTER — Encounter: Payer: Medicare Other | Admitting: Acute Care

## 2020-04-14 ENCOUNTER — Encounter: Payer: Self-pay | Admitting: Acute Care
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
## 2020-05-02 ENCOUNTER — Other Ambulatory Visit: Payer: Self-pay

## 2020-05-02 ENCOUNTER — Ambulatory Visit (HOSPITAL_COMMUNITY)
Admission: RE | Admit: 2020-05-02 | Discharge: 2020-05-02 | Disposition: A | Discharge status: Home / Self Care | Payer: Medicare Other | Source: Ambulatory Visit | Attending: Acute Care | Admitting: Acute Care

## 2020-05-02 DIAGNOSIS — Z87891 Personal history of nicotine dependence: Secondary | ICD-10-CM | POA: Insufficient documentation

## 2020-05-02 DIAGNOSIS — F17210 Nicotine dependence, cigarettes, uncomplicated: Secondary | ICD-10-CM | POA: Diagnosis present

## 2020-05-08 NOTE — Progress Notes (Signed)
Please call patient and let them  know their  low dose Ct was read as a Lung RADS 2: nodules that are benign in appearance and behavior with a very low likelihood of becoming a clinically active cancer due to size or lack of growth. Recommendation per radiology is for a repeat LDCT in 12 months. .Please let them  know we will order and schedule their  annual screening scan for 05/2021 Please let them  know there was notation of CAD on their  scan.  Please remind the patient  that this is a non-gated exam therefore degree or severity of disease  cannot be determined. Please have them  follow up with their PCP regarding potential risk factor modification, dietary therapy or pharmacologic therapy if clinically indicated. Pt.  is  currently on statin therapy. Please place order for annual  screening scan for  05/2021 and fax results to PCP. Thanks so much.  [name], let him know that the nodule of concern form the previous scan was is stable to slightly decreased in size. This is good news. Thanks so much

## 2020-05-09 ENCOUNTER — Other Ambulatory Visit: Payer: Self-pay | Admitting: *Deleted

## 2020-05-09 DIAGNOSIS — Z87891 Personal history of nicotine dependence: Secondary | ICD-10-CM

## 2020-05-09 DIAGNOSIS — F17210 Nicotine dependence, cigarettes, uncomplicated: Secondary | ICD-10-CM

## 2020-10-02 ENCOUNTER — Ambulatory Visit: Payer: Medicare Other | Attending: Internal Medicine

## 2020-10-02 DIAGNOSIS — Z23 Encounter for immunization: Secondary | ICD-10-CM

## 2020-10-02 NOTE — Progress Notes (Signed)
   Covid-19 Vaccination Clinic  Name:  [name] [name]    MRN: [id number] DOB: June 23, 1951  10/02/2020  [name] [name] was observed post Covid-19 immunization for 15 minutes without incident. He was provided with Vaccine Information Sheet and instruction to access the V-Safe system.   [name] [name] was instructed to call 911 with any severe reactions post vaccine: Marland Kitchen Difficulty breathing  . Swelling of face and throat  . A fast heartbeat  . A bad rash all over body  . Dizziness and weakness   Immunizations Administered    Name Date Dose VIS Date Route   Pfizer COVID-19 Vaccine 10/02/2020  1:11 PM 0.3 mL 08/20/2020 Intramuscular   Manufacturer: [place]   Lot: [id number]   [place]: [id number]

## 2020-11-26 ENCOUNTER — Other Ambulatory Visit: Payer: Medicare Other

## 2020-11-26 DIAGNOSIS — Z20822 Contact with and (suspected) exposure to covid-19: Secondary | ICD-10-CM

## 2020-11-27 LAB — NOVEL CORONAVIRUS, NAA: SARS-CoV-2, NAA: NOT DETECTED

## 2020-11-27 LAB — SARS-COV-2, NAA 2 DAY TAT

## 2021-03-03 ENCOUNTER — Encounter: Payer: Self-pay | Admitting: Family Medicine

## 2021-03-03 ENCOUNTER — Ambulatory Visit (INDEPENDENT_AMBULATORY_CARE_PROVIDER_SITE_OTHER): Payer: Medicare Other | Admitting: Family Medicine

## 2021-03-03 ENCOUNTER — Other Ambulatory Visit: Payer: Self-pay

## 2021-03-03 VITALS — BP 181/82 | HR 64 | Temp 97.3°F | Ht 69.0 in | Wt 140.2 lb

## 2021-03-03 DIAGNOSIS — R1907 Generalized intra-abdominal and pelvic swelling, mass and lump: Secondary | ICD-10-CM

## 2021-03-03 DIAGNOSIS — J449 Chronic obstructive pulmonary disease, unspecified: Secondary | ICD-10-CM

## 2021-03-03 DIAGNOSIS — Z720 Tobacco use: Secondary | ICD-10-CM

## 2021-03-03 DIAGNOSIS — Z1211 Encounter for screening for malignant neoplasm of colon: Secondary | ICD-10-CM | POA: Diagnosis not present

## 2021-03-03 DIAGNOSIS — I714 Abdominal aortic aneurysm, without rupture, unspecified: Secondary | ICD-10-CM

## 2021-03-03 DIAGNOSIS — M5416 Radiculopathy, lumbar region: Secondary | ICD-10-CM

## 2021-03-03 DIAGNOSIS — I10 Essential (primary) hypertension: Secondary | ICD-10-CM | POA: Diagnosis not present

## 2021-03-03 DIAGNOSIS — R569 Unspecified convulsions: Secondary | ICD-10-CM

## 2021-03-03 DIAGNOSIS — Z125 Encounter for screening for malignant neoplasm of prostate: Secondary | ICD-10-CM

## 2021-03-03 LAB — BAYER DCA HB A1C WAIVED: HB A1C (BAYER DCA - WAIVED): 5.5 % (ref <7.0)

## 2021-03-03 NOTE — Progress Notes (Signed)
Hello [name],  Your lab result is normal and/or stable.Some minor variations that are not significant are commonly marked abnormal, but do not represent any medical problem for you.  Best regards, Lanai Conlee, M.D.
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
## 2021-03-04 ENCOUNTER — Telehealth: Payer: Self-pay | Admitting: *Deleted

## 2021-03-04 LAB — CBC WITH DIFFERENTIAL/PLATELET
Basophils Absolute: 0.1 10*3/uL (ref 0.0–0.2)
Basos: 1 %
EOS (ABSOLUTE): 0.1 10*3/uL (ref 0.0–0.4)
Eos: 1 %
Hematocrit: 43.1 % (ref 37.5–51.0)
Hemoglobin: 14.6 g/dL (ref 13.0–17.7)
Immature Grans (Abs): 0 10*3/uL (ref 0.0–0.1)
Immature Granulocytes: 0 %
Lymphocytes Absolute: 1.7 10*3/uL (ref 0.7–3.1)
Lymphs: 23 %
MCH: 33 pg (ref 26.6–33.0)
MCHC: 33.9 g/dL (ref 31.5–35.7)
MCV: 98 fL — ABNORMAL HIGH (ref 79–97)
Monocytes Absolute: 0.8 10*3/uL (ref 0.1–0.9)
Monocytes: 11 %
Neutrophils Absolute: 4.9 10*3/uL (ref 1.4–7.0)
Neutrophils: 64 %
Platelets: 156 10*3/uL (ref 150–450)
RBC: 4.42 x10E6/uL (ref 4.14–5.80)
RDW: 13 % (ref 11.6–15.4)
WBC: 7.6 10*3/uL (ref 3.4–10.8)

## 2021-03-04 LAB — CMP14+EGFR
ALT: 6 IU/L (ref 0–44)
AST: 19 IU/L (ref 0–40)
Albumin/Globulin Ratio: 1.3 (ref 1.2–2.2)
Albumin: 4.3 g/dL (ref 3.7–4.7)
Alkaline Phosphatase: 54 IU/L (ref 44–121)
BUN/Creatinine Ratio: 11 (ref 10–24)
BUN: 12 mg/dL (ref 8–27)
Bilirubin Total: 0.7 mg/dL (ref 0.0–1.2)
CO2: 24 mmol/L (ref 20–29)
Calcium: 9.8 mg/dL (ref 8.6–10.2)
Chloride: 100 mmol/L (ref 96–106)
Creatinine, Ser: 1.09 mg/dL (ref 0.76–1.27)
Globulin, Total: 3.2 g/dL (ref 1.5–4.5)
Glucose: 99 mg/dL (ref 65–99)
Potassium: 5.1 mmol/L (ref 3.5–5.2)
Sodium: 139 mmol/L (ref 134–144)
Total Protein: 7.5 g/dL (ref 6.0–8.5)
eGFR: 73 mL/min/{1.73_m2} (ref >59)

## 2021-03-04 LAB — LIPID PANEL
Chol/HDL Ratio: 2.1 ratio (ref 0.0–5.0)
Cholesterol, Total: 122 mg/dL (ref 100–199)
HDL: 57 mg/dL (ref >39)
LDL Chol Calc (NIH): 52 mg/dL (ref 0–99)
Triglycerides: 61 mg/dL (ref 0–149)
VLDL Cholesterol Cal: 13 mg/dL (ref 5–40)

## 2021-03-04 LAB — PSA, TOTAL AND FREE
PSA, Free Pct: 45 %
PSA, Free: 0.27 ng/mL
Prostate Specific Ag, Serum: 0.6 ng/mL (ref 0.0–4.0)

## 2021-03-04 NOTE — Telephone Encounter (Signed)
FYI: VM rcvd from pt saw Dr. Livia Snellen yesterday, was told Dr. Livia Snellen could not write for his Oxycodone prescription, had to go to a pain clinic. He will not be transferring here, he will be staying at [facility]. He enjoyed his visit but he won't be seeing him anymore.

## 2021-03-05 ENCOUNTER — Encounter: Payer: Self-pay | Admitting: Internal Medicine

## 2021-03-08 NOTE — Progress Notes (Signed)
Hello [place],  Your lab result is normal and/or stable.Some minor variations that are not significant are commonly marked abnormal, but do not represent any medical problem for you.  Best regards, Claretta Fraise, M.D.

## 2021-06-29 ENCOUNTER — Ambulatory Visit: Payer: Medicare Other | Admitting: Gastroenterology

## 2021-06-29 ENCOUNTER — Encounter: Payer: Self-pay | Admitting: Internal Medicine

## 2021-09-03 ENCOUNTER — Other Ambulatory Visit (HOSPITAL_BASED_OUTPATIENT_CLINIC_OR_DEPARTMENT_OTHER): Payer: Self-pay

## 2021-09-16 ENCOUNTER — Ambulatory Visit: Payer: Medicare Other | Attending: Internal Medicine

## 2021-09-16 ENCOUNTER — Other Ambulatory Visit (HOSPITAL_BASED_OUTPATIENT_CLINIC_OR_DEPARTMENT_OTHER): Payer: Self-pay

## 2021-09-16 ENCOUNTER — Telehealth: Payer: Self-pay | Admitting: Family Medicine

## 2021-09-16 DIAGNOSIS — Z23 Encounter for immunization: Secondary | ICD-10-CM

## 2021-09-16 MED ORDER — PFIZER COVID-19 VAC BIVALENT 30 MCG/0.3ML IM SUSP
INTRAMUSCULAR | 0 refills | Status: DC
  Filled 2021-09-16: qty 0.3, 1d supply, fill #0

## 2021-09-16 NOTE — Telephone Encounter (Signed)
  No answer unable to leave a message for patient to call back and schedule Medicare Annual Wellness Visit (AWV) to be completed by video or phone.  No hx of AWV eligible for AWVI as of 01/01/2011per [place]  Please schedule at anytime with [facility] Advisor.      83 Minutes appointment   Any questions, please call me at [phone]

## 2021-09-16 NOTE — Progress Notes (Signed)
   Covid-19 Vaccination Clinic  Name:  [name] [name]    MRN: [id number] DOB: 26-Apr-1951  09/16/2021  [name] was observed post Covid-19 immunization for 15 minutes without incident. He was provided with Vaccine Information Sheet and instruction to access the V-Safe system.   [name] was instructed to call 911 with any severe reactions post vaccine: Difficulty breathing  Swelling of face and throat  A fast heartbeat  A bad rash all over body  Dizziness and weakness   Immunizations Administered     Name Date Dose VIS Date Route   Pfizer Covid-19 Vaccine Bivalent Booster 09/16/2021 12:45 PM 0.3 mL 07/01/2021 Intramuscular   Manufacturer: [place]   Lot: [id number]   [place]: [phone]

## 2021-11-11 ENCOUNTER — Telehealth: Payer: Self-pay | Admitting: Family Medicine

## 2021-11-11 ENCOUNTER — Encounter: Payer: Self-pay | Admitting: Internal Medicine

## 2021-11-11 ENCOUNTER — Ambulatory Visit: Payer: Medicare Other | Admitting: Gastroenterology

## 2021-11-11 NOTE — Telephone Encounter (Signed)
°  Left message for patient to call back and schedule Medicare Annual Wellness Visit (AWV) to be completed by video or phone.  No hx of AWV eligible for AWVI as of 11/01/2009 per [facility]  Please schedule at anytime with [place] --- Karle Starch  45 Minutes appointment   Any questions, please call me at [phone]

## 2022-04-01 ENCOUNTER — Ambulatory Visit: Payer: Medicare Other | Attending: Internal Medicine

## 2022-04-01 DIAGNOSIS — Z23 Encounter for immunization: Secondary | ICD-10-CM

## 2022-04-02 ENCOUNTER — Other Ambulatory Visit (HOSPITAL_BASED_OUTPATIENT_CLINIC_OR_DEPARTMENT_OTHER): Payer: Self-pay

## 2022-04-02 MED ORDER — PFIZER COVID-19 VAC BIVALENT 30 MCG/0.3ML IM SUSP
INTRAMUSCULAR | 0 refills | Status: DC
  Filled 2022-04-02: qty 0.3, 1d supply, fill #0

## 2022-04-02 NOTE — Progress Notes (Signed)
   Covid-19 Vaccination Clinic  Name:  [name] [name]    MRN: [id number] DOB: October 18, 1951  04/02/2022  [name] was observed post Covid-19 immunization for 15 minutes without incident. He was provided with Vaccine Information Sheet and instruction to access the V-Safe system.   [name] was instructed to call 911 with any severe reactions post vaccine: Difficulty breathing  Swelling of face and throat  A fast heartbeat  A bad rash all over body  Dizziness and weakness   Immunizations Administered     Name Date Dose VIS Date Route   Pfizer Covid-19 Vaccine Bivalent Booster 04/01/2022  1:00 PM 0.3 mL 07/01/2021 Intramuscular   Manufacturer: [place]   Lot: [id number]   [place]: [phone]

## 2022-04-25 IMAGING — CT CT CHEST LCS NODULE FOLLOW-UP W/O CM
1 series · 10 of 10 positions shown, 13 images · non-contrast
Comparison: Chest CT 08/30/2018.

CLINICAL DATA: 69-year-old male current smoker with 76 pack-year
history of smoking. Lung cancer screening examination.

EXAM:
CT CHEST WITHOUT CONTRAST FOR LUNG CANCER SCREENING NODULE FOLLOW-UP
TECHNIQUE: Multidetector CT imaging of the chest was performed following the
standard protocol without IV contrast.

[ct lung segmentation data · axial · 0.78mm/px · z∈[+1187,+1187]mm · 10 of 341 frames shown]
[frame 1/341  mediastinal]
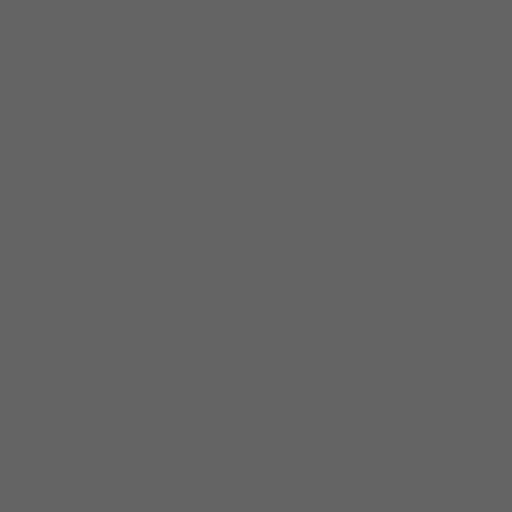
[frame 1/341  lung]
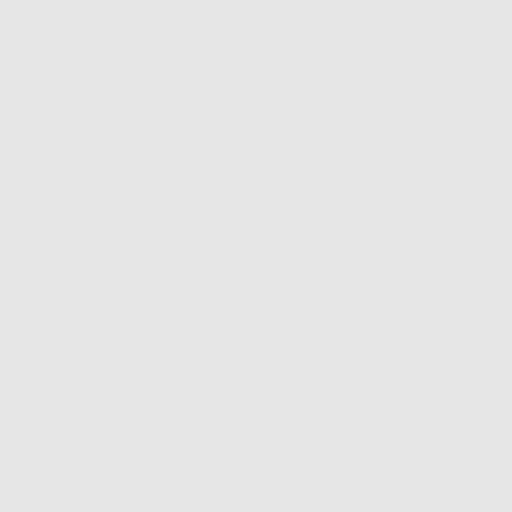
[frame 38/341  lung]
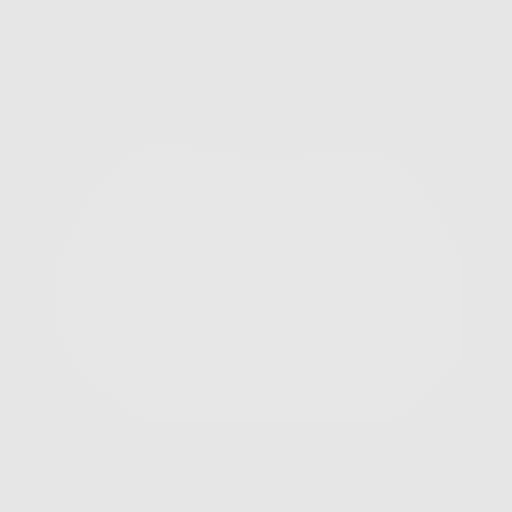
[frame 76/341  lung]
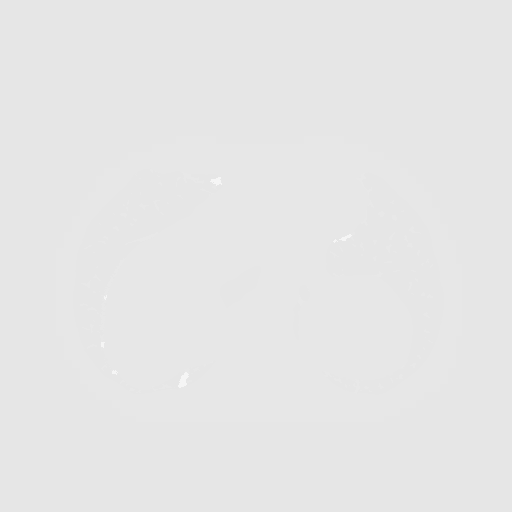
[frame 114/341  lung]
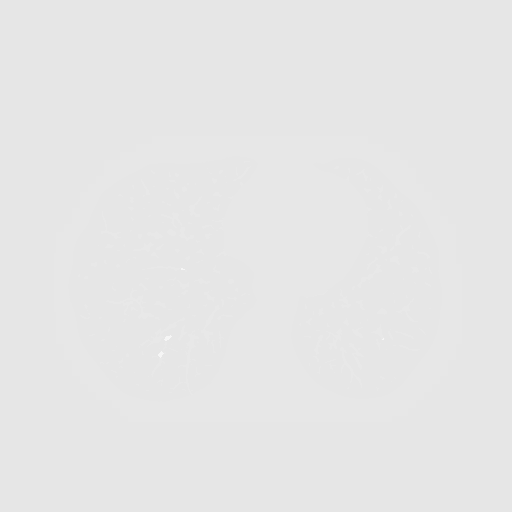
[frame 152/341  mediastinal]
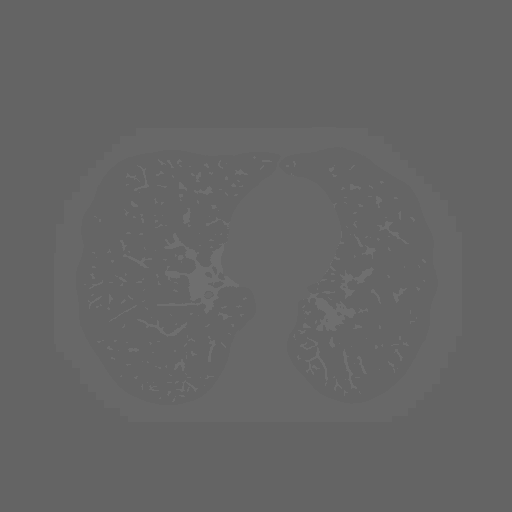
[frame 152/341  lung]
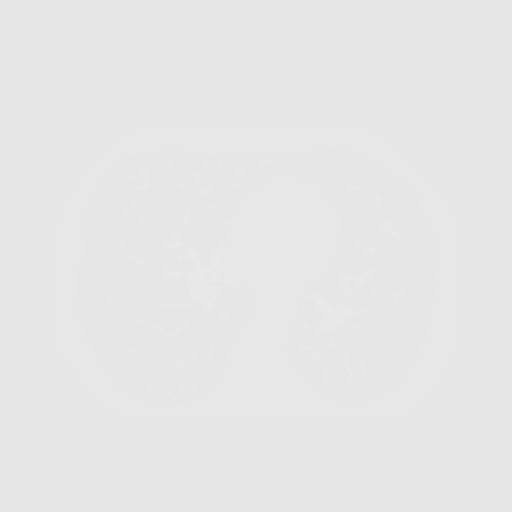
[frame 189/341  lung]
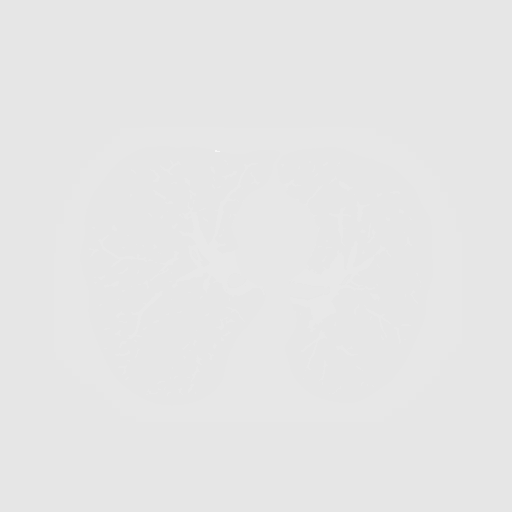
[frame 227/341  lung]
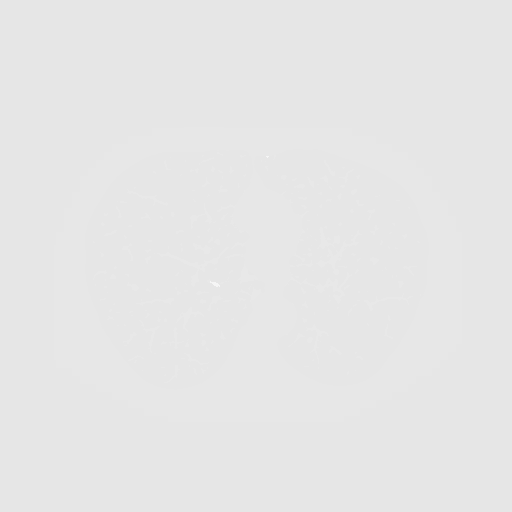
[frame 265/341  lung]
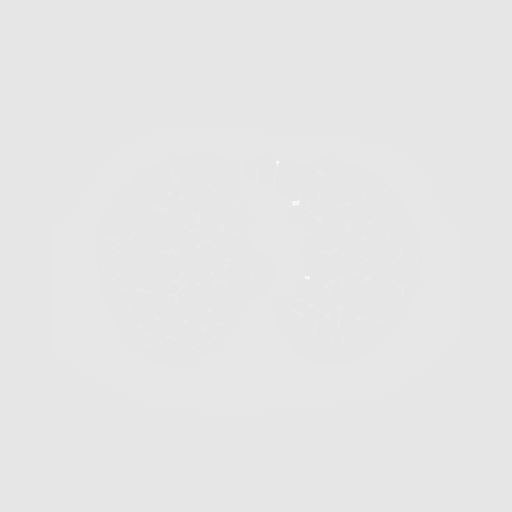
[frame 303/341  mediastinal]
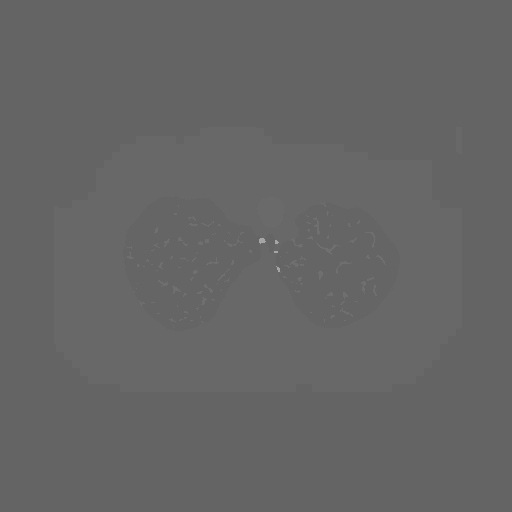
[frame 303/341  lung]
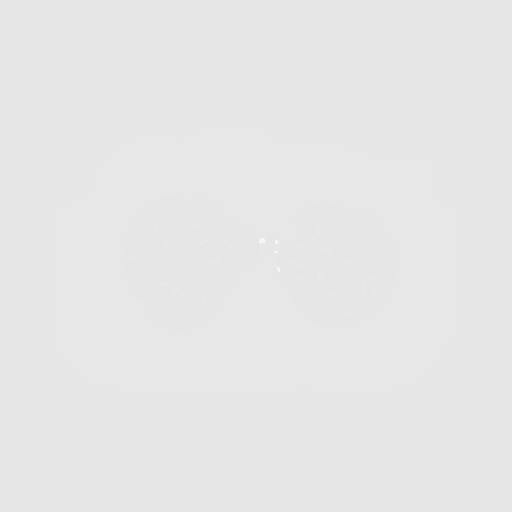
[frame 341/341  lung]
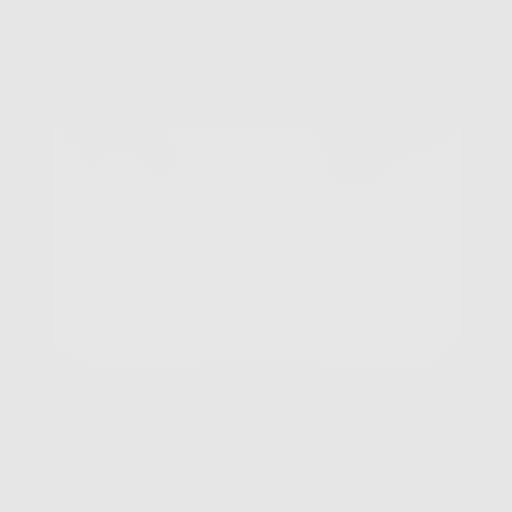

[10 of 10 positions shown; findings below may reference images not displayed]

FINDINGS: Cardiovascular: Heart size is normal. There is no significant
pericardial fluid, thickening or pericardial calcification. There is
aortic atherosclerosis, as well as atherosclerosis of the great
vessels of the mediastinum and the coronary arteries, including
calcified atherosclerotic plaque in the left anterior descending,
left circumflex and right coronary arteries. Calcifications of the
aortic valve. Severe calcifications of the inferior aspect of the
mitral annulus.

Mediastinum/Nodes: No pathologically enlarged mediastinal or hilar
lymph nodes. Please note that accurate exclusion of hilar adenopathy
is limited on noncontrast CT scans. Esophagus is unremarkable in
appearance. No axillary lymphadenopathy.

Lungs/Pleura: Nodule of concern in the medial aspect of the right
lower lobe on the prior examination is stable to slightly decreased
in size (axial image 254 of series 3), with a volume derived mean
diameter of 8.6 mm. No other larger more suspicious appearing
pulmonary nodules or masses are noted. No acute consolidative
airspace disease. No pleural effusions. Diffuse bronchial wall
thickening with moderate to severe centrilobular and paraseptal
emphysema

Upper Abdomen: Aortic atherosclerosis.

Musculoskeletal: Chronic T9 compression fracture with up to 80% loss
of central vertebral body height, similar to the prior study. There
are no aggressive appearing lytic or blastic lesions noted in the
visualized portions of the skeleton.
IMPRESSION: 1. Lung-RADS 2S, benign appearance or behavior. Continue annual
screening with low-dose chest CT without contrast in 12 months.
2. The "S" modifier above refers to potentially clinically
significant non lung cancer related findings. Specifically, there is
aortic atherosclerosis, in addition to 3 vessel coronary artery
disease. Please note that although the presence of coronary artery
calcium documents the presence of coronary artery disease, the
severity of this disease and any potential stenosis cannot be
assessed on this non-gated CT examination. Assessment for potential
risk factor modification, dietary therapy or pharmacologic therapy
may be warranted, if clinically indicated.
3. Mild diffuse bronchial wall thickening with moderate to severe
centrilobular and paraseptal emphysema; imaging findings suggestive
of underlying COPD.
4. There are calcifications of the aortic valve and mitral annulus.
Echocardiographic correlation for evaluation of potential valvular
dysfunction may be warranted if clinically indicated.

Aortic Atherosclerosis (7P42D-WLD.D) and Emphysema (7P42D-BBE.B).

## 2022-05-11 LAB — COLOGUARD: COLOGUARD: POSITIVE — AB

## 2024-02-17 ENCOUNTER — Other Ambulatory Visit (HOSPITAL_COMMUNITY): Payer: Self-pay | Admitting: Internal Medicine

## 2024-02-17 DIAGNOSIS — I708 Atherosclerosis of other arteries: Secondary | ICD-10-CM

## 2024-03-27 ENCOUNTER — Encounter: Payer: Self-pay | Admitting: *Deleted

## 2024-05-10 ENCOUNTER — Other Ambulatory Visit: Payer: Self-pay | Admitting: Nephrology

## 2024-05-10 DIAGNOSIS — N183 Chronic kidney disease, stage 3 unspecified: Secondary | ICD-10-CM

## 2024-05-23 ENCOUNTER — Other Ambulatory Visit: Payer: Self-pay | Admitting: Nephrology

## 2024-05-23 DIAGNOSIS — N183 Chronic kidney disease, stage 3 unspecified: Secondary | ICD-10-CM

## 2024-05-29 NOTE — Progress Notes (Signed)
 This encounter was created in error - please disregard.

## 2024-06-23 ENCOUNTER — Encounter (HOSPITAL_COMMUNITY): Payer: Self-pay

## 2024-06-23 ENCOUNTER — Inpatient Hospital Stay (HOSPITAL_COMMUNITY)

## 2024-06-23 ENCOUNTER — Other Ambulatory Visit: Payer: Self-pay

## 2024-06-23 ENCOUNTER — Emergency Department (HOSPITAL_COMMUNITY)

## 2024-06-23 ENCOUNTER — Inpatient Hospital Stay (HOSPITAL_COMMUNITY)
Admission: EM | Admit: 2024-06-23 | Discharge: 2024-06-29 | DRG: 682 | Disposition: A | Discharge status: Home / Self Care | Attending: Internal Medicine | Admitting: Internal Medicine

## 2024-06-23 DIAGNOSIS — F17210 Nicotine dependence, cigarettes, uncomplicated: Secondary | ICD-10-CM | POA: Diagnosis present

## 2024-06-23 DIAGNOSIS — D649 Anemia, unspecified: Secondary | ICD-10-CM | POA: Diagnosis present

## 2024-06-23 DIAGNOSIS — E871 Hypo-osmolality and hyponatremia: Secondary | ICD-10-CM | POA: Diagnosis present

## 2024-06-23 DIAGNOSIS — Z8249 Family history of ischemic heart disease and other diseases of the circulatory system: Secondary | ICD-10-CM

## 2024-06-23 DIAGNOSIS — J439 Emphysema, unspecified: Secondary | ICD-10-CM | POA: Diagnosis present

## 2024-06-23 DIAGNOSIS — Z79899 Other long term (current) drug therapy: Secondary | ICD-10-CM

## 2024-06-23 DIAGNOSIS — Z833 Family history of diabetes mellitus: Secondary | ICD-10-CM | POA: Diagnosis not present

## 2024-06-23 DIAGNOSIS — Z6820 Body mass index (BMI) 20.0-20.9, adult: Secondary | ICD-10-CM

## 2024-06-23 DIAGNOSIS — N261 Atrophy of kidney (terminal): Secondary | ICD-10-CM | POA: Diagnosis present

## 2024-06-23 DIAGNOSIS — I10 Essential (primary) hypertension: Secondary | ICD-10-CM | POA: Diagnosis not present

## 2024-06-23 DIAGNOSIS — J449 Chronic obstructive pulmonary disease, unspecified: Secondary | ICD-10-CM | POA: Diagnosis present

## 2024-06-23 DIAGNOSIS — E538 Deficiency of other specified B group vitamins: Secondary | ICD-10-CM | POA: Diagnosis present

## 2024-06-23 DIAGNOSIS — Z8619 Personal history of other infectious and parasitic diseases: Secondary | ICD-10-CM | POA: Diagnosis present

## 2024-06-23 DIAGNOSIS — I739 Peripheral vascular disease, unspecified: Secondary | ICD-10-CM | POA: Diagnosis present

## 2024-06-23 DIAGNOSIS — I2510 Atherosclerotic heart disease of native coronary artery without angina pectoris: Secondary | ICD-10-CM | POA: Diagnosis present

## 2024-06-23 DIAGNOSIS — E785 Hyperlipidemia, unspecified: Secondary | ICD-10-CM | POA: Diagnosis present

## 2024-06-23 DIAGNOSIS — R531 Weakness: Secondary | ICD-10-CM | POA: Diagnosis not present

## 2024-06-23 DIAGNOSIS — Z743 Need for continuous supervision: Secondary | ICD-10-CM | POA: Diagnosis not present

## 2024-06-23 DIAGNOSIS — K682 Retroperitoneal fibrosis: Secondary | ICD-10-CM | POA: Diagnosis present

## 2024-06-23 DIAGNOSIS — R569 Unspecified convulsions: Secondary | ICD-10-CM

## 2024-06-23 DIAGNOSIS — I159 Secondary hypertension, unspecified: Secondary | ICD-10-CM | POA: Diagnosis present

## 2024-06-23 DIAGNOSIS — N179 Acute kidney failure, unspecified: Secondary | ICD-10-CM | POA: Diagnosis present

## 2024-06-23 DIAGNOSIS — N1330 Unspecified hydronephrosis: Secondary | ICD-10-CM | POA: Diagnosis present

## 2024-06-23 DIAGNOSIS — I714 Abdominal aortic aneurysm, without rupture, unspecified: Secondary | ICD-10-CM | POA: Diagnosis present

## 2024-06-23 DIAGNOSIS — K219 Gastro-esophageal reflux disease without esophagitis: Secondary | ICD-10-CM | POA: Diagnosis present

## 2024-06-23 HISTORY — PX: IR NEPHROSTOMY PLACEMENT LEFT: IMG6063

## 2024-06-23 LAB — COMPREHENSIVE METABOLIC PANEL WITH GFR
ALT: 6 U/L (ref 0–44)
AST: 15 U/L (ref 15–41)
Albumin: 3.8 g/dL (ref 3.5–5.0)
Alkaline Phosphatase: 55 U/L (ref 38–126)
Anion gap: 17 — ABNORMAL HIGH (ref 5–15)
BUN: 67 mg/dL — ABNORMAL HIGH (ref 8–23)
CO2: 23 mmol/L (ref 22–32)
Calcium: 9 mg/dL (ref 8.9–10.3)
Chloride: 91 mmol/L — ABNORMAL LOW (ref 98–111)
Creatinine, Ser: 9.37 mg/dL — ABNORMAL HIGH (ref 0.61–1.24)
GFR, Estimated: 5 mL/min — ABNORMAL LOW (ref >60)
Glucose, Bld: 171 mg/dL — ABNORMAL HIGH (ref 70–99)
Potassium: 4 mmol/L (ref 3.5–5.1)
Sodium: 131 mmol/L — ABNORMAL LOW (ref 135–145)
Total Bilirubin: 1.1 mg/dL (ref 0.0–1.2)
Total Protein: 8.3 g/dL — ABNORMAL HIGH (ref 6.5–8.1)

## 2024-06-23 LAB — URINALYSIS, ROUTINE W REFLEX MICROSCOPIC
Bacteria, UA: NONE SEEN
Bilirubin Urine: NEGATIVE
Glucose, UA: NEGATIVE mg/dL
Ketones, ur: NEGATIVE mg/dL
Leukocytes,Ua: NEGATIVE
Nitrite: NEGATIVE
Protein, ur: NEGATIVE mg/dL
Specific Gravity, Urine: 1.013 (ref 1.005–1.030)
pH: 7 (ref 5.0–8.0)

## 2024-06-23 LAB — CBC WITH DIFFERENTIAL/PLATELET
Abs Immature Granulocytes: 0.04 K/uL (ref 0.00–0.07)
Basophils Absolute: 0 K/uL (ref 0.0–0.1)
Basophils Relative: 0 %
Eosinophils Absolute: 0.1 K/uL (ref 0.0–0.5)
Eosinophils Relative: 1 %
HCT: 37.2 % — ABNORMAL LOW (ref 39.0–52.0)
Hemoglobin: 12.7 g/dL — ABNORMAL LOW (ref 13.0–17.0)
Immature Granulocytes: 1 %
Lymphocytes Relative: 20 %
Lymphs Abs: 1.6 K/uL (ref 0.7–4.0)
MCH: 32.6 pg (ref 26.0–34.0)
MCHC: 34.1 g/dL (ref 30.0–36.0)
MCV: 95.6 fL (ref 80.0–100.0)
Monocytes Absolute: 0.9 K/uL (ref 0.1–1.0)
Monocytes Relative: 11 %
Neutro Abs: 5.2 K/uL (ref 1.7–7.7)
Neutrophils Relative %: 67 %
Platelets: 225 K/uL (ref 150–400)
RBC: 3.89 MIL/uL — ABNORMAL LOW (ref 4.22–5.81)
RDW: 13.4 % (ref 11.5–15.5)
WBC: 7.8 K/uL (ref 4.0–10.5)
nRBC: 0 % (ref 0.0–0.2)

## 2024-06-23 LAB — SODIUM, URINE, RANDOM: Sodium, Ur: 85 mmol/L

## 2024-06-23 LAB — CREATININE, URINE, RANDOM: Creatinine, Urine: 47 mg/dL

## 2024-06-23 LAB — MAGNESIUM: Magnesium: 1.9 mg/dL (ref 1.7–2.4)

## 2024-06-23 LAB — TROPONIN I (HIGH SENSITIVITY)
Troponin I (High Sensitivity): 12 ng/L (ref <18)
Troponin I (High Sensitivity): 11 ng/L (ref <18)

## 2024-06-23 LAB — PROTIME-INR
INR: 1 (ref 0.8–1.2)
Prothrombin Time: 13.8 s (ref 11.4–15.2)

## 2024-06-23 LAB — PHOSPHORUS: Phosphorus: 6.1 mg/dL — ABNORMAL HIGH (ref 2.5–4.6)

## 2024-06-23 LAB — LIPASE, BLOOD: Lipase: 37 U/L (ref 11–51)

## 2024-06-23 MED ORDER — ONDANSETRON HCL 4 MG/2ML IJ SOLN
4.0000 mg | Freq: Once | INTRAMUSCULAR | Status: AC
  Administered 2024-06-23: 4 mg via INTRAVENOUS
  Filled 2024-06-23: qty 2

## 2024-06-23 MED ORDER — TRAZODONE HCL 50 MG PO TABS
25.0000 mg | ORAL_TABLET | Freq: Every evening | ORAL | Status: DC | PRN
  Administered 2024-06-24 – 2024-06-25 (×2): 25 mg via ORAL
  Filled 2024-06-23 (×2): qty 1

## 2024-06-23 MED ORDER — LABETALOL HCL 200 MG PO TABS
200.0000 mg | ORAL_TABLET | Freq: Two times a day (BID) | ORAL | Status: DC
  Administered 2024-06-23 – 2024-06-24 (×2): 200 mg via ORAL
  Filled 2024-06-23 (×3): qty 1

## 2024-06-23 MED ORDER — FLEET ENEMA RE ENEM: 1.0000 | ENEMA | Freq: Once | RECTAL | Status: DC | PRN

## 2024-06-23 MED ORDER — HEPARIN SODIUM (PORCINE) 5000 UNIT/ML IJ SOLN
5000.0000 [IU] | Freq: Three times a day (TID) | INTRAMUSCULAR | Status: DC
  Administered 2024-06-24 – 2024-06-27 (×10): 5000 [IU] via SUBCUTANEOUS
  Filled 2024-06-23 (×15): qty 1

## 2024-06-23 MED ORDER — FENTANYL CITRATE (PF) 100 MCG/2ML IJ SOLN
INTRAMUSCULAR | Status: AC | PRN
  Administered 2024-06-23: 50 ug via INTRAVENOUS

## 2024-06-23 MED ORDER — SENNOSIDES-DOCUSATE SODIUM 8.6-50 MG PO TABS: 1.0000 | ORAL_TABLET | Freq: Every evening | ORAL | Status: DC | PRN

## 2024-06-23 MED ORDER — SODIUM CHLORIDE 0.9% FLUSH
5.0000 mL | Freq: Three times a day (TID) | INTRAVENOUS | Status: DC
  Administered 2024-06-24 – 2024-06-29 (×12): 5 mL

## 2024-06-23 MED ORDER — HEPARIN SODIUM (PORCINE) 5000 UNIT/ML IJ SOLN: 5000.0000 [IU] | Freq: Three times a day (TID) | INTRAMUSCULAR | Status: DC

## 2024-06-23 MED ORDER — ONDANSETRON HCL 4 MG PO TABS: 4.0000 mg | ORAL_TABLET | Freq: Four times a day (QID) | ORAL | Status: DC | PRN

## 2024-06-23 MED ORDER — KETOROLAC TROMETHAMINE 15 MG/ML IJ SOLN
15.0000 mg | Freq: Once | INTRAMUSCULAR | Status: DC
  Filled 2024-06-23: qty 1

## 2024-06-23 MED ORDER — IOHEXOL 350 MG/ML SOLN
100.0000 mL | Freq: Once | INTRAVENOUS | Status: AC | PRN
  Administered 2024-06-23: 100 mL via INTRAVENOUS

## 2024-06-23 MED ORDER — AMLODIPINE BESYLATE 10 MG PO TABS
10.0000 mg | ORAL_TABLET | Freq: Every evening | ORAL | Status: DC
  Administered 2024-06-23: 10 mg via ORAL
  Filled 2024-06-23: qty 2

## 2024-06-23 MED ORDER — SODIUM CHLORIDE 0.9 % IV SOLN: INTRAVENOUS | Status: AC

## 2024-06-23 MED ORDER — MIDAZOLAM HCL 2 MG/2ML IJ SOLN
INTRAMUSCULAR | Status: AC | PRN
  Administered 2024-06-23: 1 mg via INTRAVENOUS

## 2024-06-23 MED ORDER — FENTANYL CITRATE (PF) 100 MCG/2ML IJ SOLN
INTRAMUSCULAR | Status: AC
  Filled 2024-06-23: qty 2

## 2024-06-23 MED ORDER — SODIUM CHLORIDE 0.9 % IV BOLUS
1000.0000 mL | Freq: Once | INTRAVENOUS | Status: AC
  Administered 2024-06-23: 1000 mL via INTRAVENOUS

## 2024-06-23 MED ORDER — MIDAZOLAM HCL 2 MG/2ML IJ SOLN
INTRAMUSCULAR | Status: AC
  Filled 2024-06-23: qty 2

## 2024-06-23 MED ORDER — LIDOCAINE HCL 1 % IJ SOLN
20.0000 mL | Freq: Once | INTRAMUSCULAR | Status: AC
  Administered 2024-06-23: 10 mL
  Filled 2024-06-23: qty 20

## 2024-06-23 MED ORDER — SODIUM CHLORIDE 0.9% FLUSH
3.0000 mL | Freq: Two times a day (BID) | INTRAVENOUS | Status: DC
  Administered 2024-06-23 – 2024-06-28 (×5): 3 mL via INTRAVENOUS

## 2024-06-23 MED ORDER — BISACODYL 5 MG PO TBEC: 5.0000 mg | DELAYED_RELEASE_TABLET | Freq: Every day | ORAL | Status: DC | PRN

## 2024-06-23 MED ORDER — ACETAMINOPHEN 650 MG RE SUPP: 650.0000 mg | Freq: Four times a day (QID) | RECTAL | Status: DC | PRN

## 2024-06-23 MED ORDER — DIVALPROEX SODIUM ER 500 MG PO TB24
1000.0000 mg | ORAL_TABLET | Freq: Every day | ORAL | Status: DC
  Administered 2024-06-23 – 2024-06-28 (×6): 1000 mg via ORAL
  Filled 2024-06-23 (×6): qty 2

## 2024-06-23 MED ORDER — IPRATROPIUM BROMIDE 0.02 % IN SOLN: 0.5000 mg | Freq: Four times a day (QID) | RESPIRATORY_TRACT | Status: DC | PRN

## 2024-06-23 MED ORDER — HYDROMORPHONE HCL 1 MG/ML IJ SOLN: 0.5000 mg | INTRAMUSCULAR | Status: DC | PRN

## 2024-06-23 MED ORDER — MORPHINE SULFATE (PF) 4 MG/ML IV SOLN
4.0000 mg | Freq: Once | INTRAVENOUS | Status: AC
  Administered 2024-06-23: 4 mg via INTRAVENOUS
  Filled 2024-06-23: qty 1

## 2024-06-23 MED ORDER — ATORVASTATIN CALCIUM 40 MG PO TABS
40.0000 mg | ORAL_TABLET | Freq: Every day | ORAL | Status: DC
  Administered 2024-06-23 – 2024-06-29 (×6): 40 mg via ORAL
  Filled 2024-06-23 (×7): qty 1

## 2024-06-23 MED ORDER — ONDANSETRON HCL 4 MG/2ML IJ SOLN: 4.0000 mg | Freq: Four times a day (QID) | INTRAMUSCULAR | Status: DC | PRN

## 2024-06-23 MED ORDER — IOHEXOL 300 MG/ML  SOLN
50.0000 mL | Freq: Once | INTRAMUSCULAR | Status: AC | PRN
Start: 2024-06-23 — End: 2024-06-23
  Administered 2024-06-23: 10 mL

## 2024-06-23 MED ORDER — HYDRALAZINE HCL 20 MG/ML IJ SOLN
10.0000 mg | Freq: Once | INTRAMUSCULAR | Status: AC
  Administered 2024-06-23: 10 mg via INTRAVENOUS
  Filled 2024-06-23: qty 1

## 2024-06-23 MED ORDER — LEVALBUTEROL HCL 0.63 MG/3ML IN NEBU: 0.6300 mg | INHALATION_SOLUTION | Freq: Four times a day (QID) | RESPIRATORY_TRACT | Status: DC | PRN

## 2024-06-23 MED ORDER — LIDOCAINE HCL 1 % IJ SOLN
INTRAMUSCULAR | Status: AC
  Filled 2024-06-23: qty 20

## 2024-06-23 MED ORDER — MAGNESIUM 250 MG PO TABS: 250.0000 mg | ORAL_TABLET | Freq: Every day | ORAL | Status: DC

## 2024-06-23 MED ORDER — MAGNESIUM OXIDE -MG SUPPLEMENT 400 (240 MG) MG PO TABS
200.0000 mg | ORAL_TABLET | Freq: Every day | ORAL | Status: DC
  Administered 2024-06-24 – 2024-06-29 (×5): 200 mg via ORAL
  Filled 2024-06-23 (×6): qty 1

## 2024-06-23 MED ORDER — LABETALOL HCL 5 MG/ML IV SOLN: 10.0000 mg | INTRAVENOUS | Status: DC | PRN

## 2024-06-23 MED ORDER — HYDRALAZINE HCL 20 MG/ML IJ SOLN
10.0000 mg | INTRAMUSCULAR | Status: DC | PRN
  Administered 2024-06-23 – 2024-06-28 (×3): 10 mg via INTRAVENOUS
  Filled 2024-06-23 (×3): qty 1

## 2024-06-23 MED ORDER — ACETAMINOPHEN 325 MG PO TABS: 650.0000 mg | ORAL_TABLET | Freq: Four times a day (QID) | ORAL | Status: DC | PRN

## 2024-06-23 MED ORDER — SODIUM CHLORIDE 0.9% FLUSH
3.0000 mL | Freq: Two times a day (BID) | INTRAVENOUS | Status: DC
  Administered 2024-06-24 – 2024-06-28 (×7): 3 mL via INTRAVENOUS

## 2024-06-23 MED ORDER — OXYCODONE HCL 5 MG PO TABS: 5.0000 mg | ORAL_TABLET | ORAL | Status: DC | PRN

## 2024-06-23 NOTE — ED Notes (Addendum)
 [facility] has been called and transport requested. No ETA as of now.

## 2024-06-23 NOTE — ED Notes (Signed)
 Lab at bedside

## 2024-06-23 NOTE — ED Notes (Signed)
Notified provider of elevated BP.

## 2024-06-23 NOTE — Procedures (Signed)
  Procedure:  Left perc nephrostomy placement 77f Preprocedure diagnosis: The primary encounter diagnosis was Acute renal failure, unspecified acute renal failure type (HCC). Diagnoses of Hydronephrosis of left kidney, Secondary hypertension, and Anemia, unspecified type were also pertinent to this visit. Postprocedure diagnosis: same EBL:    minimal Complications:   none immediate  See full dictation in [employer].  CHARM Toribio Faes MD Main # [phone] Pager  [phone] Mobile [phone]

## 2024-06-23 NOTE — Assessment & Plan Note (Signed)
 Left hydronephrosis with acute renal failure  CT angio chest abdomen pelvis:  1. No evidence of acute aortic syndrome. 2. Marked right renal parenchymal atrophy, progressive since 12/20/19. 3. Moderate left hydronephrosis without definite urolithiasis.  Consults urology, interventional radiology for possibly urostomy tube placement -Signs of infection - Management per urology, monitoring closely

## 2024-06-23 NOTE — Assessment & Plan Note (Addendum)
-   CTA reviewed no signs of dissection  No abdominal aortic aneurysm. No dissection. Patent infrarenal aortobifemoral graft.

## 2024-06-23 NOTE — Assessment & Plan Note (Signed)
 Not on any antiplatelet treatment including aspirin  Will continue calcium  channel blockers, statins

## 2024-06-23 NOTE — Assessment & Plan Note (Signed)
-   No signs of exacerbation -As needed oxygen supplement, DuoNeb bronchodilator treatments

## 2024-06-23 NOTE — Assessment & Plan Note (Signed)
-   No known recent history of seizure episodes, continue home medication of Depakote

## 2024-06-23 NOTE — Assessment & Plan Note (Signed)
-   Currently on accelerated hypertensive state, holding HCTZ, lisinopril  due to AKI, continuing lisinopril  at higher dose adding labetalol , as needed hydralazine  IV -Will bring the blood pressure down slowly
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
## 2024-06-23 NOTE — ED Notes (Addendum)
 Called to give report to nurse receiving pt. Nurse was busy with another pt. Waiting for phone call to be returned, as was told I would be called back.

## 2024-06-23 NOTE — ED Provider Notes (Signed)
 [place] EMERGENCY DEPARTMENT AT [facility] Provider Note   CSN: [id number] Arrival date & time: 06/23/24  1018     Patient presents with: Flank Pain   [name] [name] is a 73 y.o. male.   Patient is a 73 year old male who presents to the emergency department with a chief complaint of back pain and abdominal pain which has been ongoing and worsening over the past week.  He does note that symptoms are worse with movement.  He notes he has had no recent falls or blunt trauma to his back.  He denies any associated urine or bowel incontinence, saddle paresthesias, gait changes.  He denies any dysuria or hematuria.  When asked about his history of AAA documented in the chart he is unsure about this.  He denies any associated fever, chills, chest pain or shortness of breath.  Patient states that he is concerned there is something going on with his kidneys.  He denies any associated dizziness, lightheadedness or syncope.  He denies any radiation to his lower extremities.  Patient was hypertensive on presentation and notes that he is not taken his medications today.   Flank Pain       Prior to Admission medications   Medication Sig Start Date End Date Taking? Authorizing Provider  amLODipine  (NORVASC ) 5 MG tablet Take 5 mg by mouth every evening.     [provider]  atenolol  (TENORMIN ) 50 MG tablet Take 50 mg by mouth every evening.    [provider]  atorvastatin  (LIPITOR) 10 MG tablet Take 1 tablet (10 mg total) by mouth daily at 6 PM. 10/05/17   Gerome, Maurilio HERO, PA-C  Cholecalciferol  (VITAMIN D3) 1000 units CAPS Take 1,000 Units by mouth daily. TAKES 4-5 TIMES WEEK    [provider]  COVID-19 mRNA bivalent vaccine, Pfizer, (PFIZER COVID-19 VAC BIVALENT) injection Inject into the muscle. 09/16/21   Luiz Channel, MD  COVID-19 mRNA bivalent vaccine, Pfizer, (PFIZER COVID-19 VAC BIVALENT) injection Inject into the muscle. 04/02/22   Luiz Channel, MD   divalproex  (DEPAKOTE ) 500 MG EC tablet Take 500 mg by mouth 2 (two) times daily.    [provider]  lisinopril  (PRINIVIL ,ZESTRIL ) 40 MG tablet Take 40 mg by mouth every evening.  09/15/17   [provider]  Magnesium  250 MG TABS Take 250 mg by mouth daily.     [provider]  Omega-3 Krill Oil 1000 MG CAPS Take 1 capsule by mouth 2 (two) times daily. TAKES 4-5 TIMES WEEK    [provider]  oxyCODONE  (OXY IR/ROXICODONE ) 5 MG immediate release tablet Take 0.5-2 tablets (2.5-10 mg total) by mouth every 6 (six) hours as needed for severe pain or breakthrough pain. 01/26/17   Sheldon Standing, MD    Allergies: Patient has no known allergies.    Review of Systems  Genitourinary:  Positive for flank pain.  All other systems reviewed and are negative.   Updated Vital Signs BP (!) 238/98   Pulse (!) 54   Temp 98 F (36.7 C) (Oral)   Resp 20   Ht 5' 9 (1.753 m)   Wt 63.6 kg   SpO2 100%   BMI 20.71 kg/m   Physical Exam Vitals and nursing note reviewed.  Constitutional:      Appearance: Normal appearance.  HENT:     Head: Normocephalic and atraumatic.     Nose: Nose normal.     Mouth/Throat:     Mouth: Mucous membranes are moist.  Eyes:     Extraocular Movements: Extraocular movements intact.     Conjunctiva/sclera: Conjunctivae normal.     Pupils: Pupils are equal, round, and reactive to light.  Cardiovascular:     Rate and Rhythm: Normal rate and regular rhythm.     Pulses: Normal pulses.     Heart sounds: Normal heart sounds. No murmur heard.    No gallop.  Pulmonary:     Effort: Pulmonary effort is normal. No respiratory distress.     Breath sounds: Normal breath sounds. No stridor. No wheezing, rhonchi or rales.  Abdominal:     General: Abdomen is flat. Bowel sounds are normal. There is no distension.     Palpations: Abdomen is soft.     Tenderness: There is abdominal tenderness. There is no guarding.  Musculoskeletal:        General:  Normal range of motion.     Cervical back: Normal range of motion and neck supple. No rigidity or tenderness.     Right lower leg: No edema.     Left lower leg: No edema.  Skin:    General: Skin is warm and dry.     Findings: No bruising or rash.  Neurological:     General: No focal deficit present.     Mental Status: He is alert and oriented to person, place, and time. Mental status is at baseline.     Cranial Nerves: No cranial nerve deficit.     Sensory: No sensory deficit.     Motor: No weakness.     Coordination: Coordination normal.     Gait: Gait normal.  Psychiatric:        Mood and Affect: Mood normal.        Behavior: Behavior normal.        Thought Content: Thought content normal.        Judgment: Judgment normal.     (all labs ordered are listed, but only abnormal results are displayed) Labs Reviewed  COMPREHENSIVE METABOLIC PANEL WITH GFR  LIPASE, BLOOD  CBC WITH DIFFERENTIAL/PLATELET  URINALYSIS, ROUTINE W REFLEX MICROSCOPIC  TROPONIN I (HIGH SENSITIVITY)    EKG: EKG Interpretation Date/Time:  Saturday June 23 2024 10:34:27 EDT Ventricular Rate:  53 PR Interval:  188 QRS Duration:  99 QT Interval:  524 QTC Calculation: 492 R Axis:   75  Text Interpretation: Sinus rhythm Consider left atrial enlargement Probable anteroseptal infarct, old Confirmed by Simon Rea [phone]) on 06/23/2024 10:46:42 AM  Radiology: No results found.   .Critical Care  Performed by: Daralene Lonni BIRCH, PA-C Authorized by: Daralene Lonni BIRCH, PA-C   Critical care provider statement:    Critical care time (minutes):  35   Critical care was necessary to treat or prevent imminent or life-threatening deterioration of the following conditions:  Renal failure   Critical care was time spent personally by me on the following activities:  Development of treatment plan with patient or surrogate, discussions with consultants, evaluation of patient's response to treatment,  examination of patient, ordering and review of laboratory studies, ordering and review of radiographic studies, ordering and performing treatments and interventions, pulse oximetry, re-evaluation of patient's condition and review of old charts   I assumed direction of critical care for this patient from another provider in my specialty: no     Care discussed with: admitting provider      Medications Ordered in the ED  iohexol  (OMNIPAQUE ) 350 MG/ML injection 100 mL (has no administration in time range)  hydrALAZINE  (APRESOLINE ) injection 10 mg (10 mg Intravenous Given 06/23/24 1049)                                    Medical Decision Making Amount and/or Complexity of Data Reviewed Labs: ordered. Radiology: ordered.  Risk Prescription drug management. Decision regarding hospitalization.   This patient presents to the ED for concern of back pain, abdominal pain, this involves an extensive number of treatment options, and is a complaint that carries with it a high risk of complications and morbidity.  The differential diagnosis includes acute appendicitis, cholecystitis close bowel obstruction, diverticulitis, testicular torsion [place] nephritis, kidney stone, pancreatitis, mesenteric ischemia, aortic dissection, renal failure, electrolyte derangement   Co morbidities that complicate the patient evaluation  Hypertension   Additional history obtained:  Additional history obtained from medical records External records from outside source obtained and reviewed including medical records   Lab Tests:  I Ordered, and personally interpreted labs.  The pertinent results include: No leukocytosis, no anemia noted, hyponatremia, elevated creatinine, normal liver function, negative serial troponins, normal lipase   Imaging Studies ordered:  I ordered imaging studies including CTA dissection study I independently visualized and interpreted imaging which showed no aneurysm or dissection,  right renal atrophy, left hydronephrosis I agree with the radiologist interpretation   Cardiac Monitoring: / EKG:  The patient was maintained on a cardiac monitor.  I personally viewed and interpreted the cardiac monitored which showed an underlying rhythm of: Normal sinus rhythm, no ST/T wave changes, no ischemic changes, no STEMI   Consultations Obtained:  I requested consultation with the neurology and interventional radiology,  and discussed lab and imaging findings as well as pertinent plan - they recommend: Admission, nephrostomy tube   Problem List / ED Course / Critical interventions / Medication management  Patient does remain stable at this point.  Initially on presentation given patient's blood pressure of 230/110 and back pain with radiation to his abdomen we will concern for possible underlying dissection.  Patient was emergently taken to CT for dissection study.  This was unremarkable for any signs of dissection or aneurysm but was noted to have left-sided hydronephrosis and right renal atrophy.  Patient was subsequently noted to be in renal failure as well.  Patient does continue to remain hypertensive despite treatment in the emergency department.  Urinalysis still pending at this time.  Blood work did demonstrate a mild hyponatremia and a normal potassium.  Did discuss patient case with Dr. Shane with urology as well as Dr. Johann with interventional radiology.  Urology did recommend nephrostomy tube and did discuss this with the interventional radiologist.  Patient has been subsequently admitted to Dr. Willette and will plan for transfer to [facility] for evaluation by urology and interventional radiology. I ordered medication including morphine , Zofran , hydralazine , IV fluids for flank pain, acute renal failure, hypertension Reevaluation of the patient after these medicines showed that the patient improved I have reviewed the patients home medicines  and have made adjustments as needed   Social Determinants of Health:  None   Test / Admission - Considered:  Admission     Final diagnoses:  None    ED Discharge Orders     None          Daralene Lonni JONETTA DEVONNA 06/23/24 1325    Simon Lavonia SAILOR, MD 06/24/24 [phone]
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
## 2024-06-23 NOTE — Hospital Course (Addendum)
 [name] [name] is a 73 year old Male with past medical history of HTN, HLD, seizures, AAA,  vitamin D  deficiency, history of rectal prolapse with correction in 2018... presenting to ED with complaint of left flank/back pain, abdominal pain.  Progressively worsening  pain over past week.  No radiation of pain to lower extremities. No associated paresthesia. Not associate with any bowel or urine incontinence.  Denying of any dysuria, not association with fever, chills nausea or vomiting.  ED evaluation:  Blood pressure (!) 203/87, pulse 62, temp.  98 F (36.7 C), RR 19, height 5' 9 (1.753 m), weight 63.6 kg, SpO2 99%.  Labs: Sodium 131, glucose 171, BUN 67, Creatinine 9.37, phosphorus 6.,  Magnesium  1.9, WBC 7.8, hemoglobin 12.7, INR 1.0,  CT angio chest abdomen pelvis: 1. No evidence of acute aortic syndrome. 2. Marked right renal parenchymal atrophy, progressive since 12/20/19. 3. Moderate left hydronephrosis without definite urolithiasis. The ureter appears nondistended. 4. Small left pleural effusion.  EDP discussed the case with Urologist Dr. Shane who recommended patient to be admitted to [facility] for interventional radiology evaluation and nephrostomy tube placement

## 2024-06-23 NOTE — ED Triage Notes (Signed)
 Pt c/o left side and LLQ pain. Pt states initial pain started over a week ago in both side and has just came down to the left side now primarily. Pt denies hx of kidney stones or injury. Pt denies complications with urination. Pt states hurts more when moving.

## 2024-06-23 NOTE — Assessment & Plan Note (Signed)
-   Acute renal failure with a creatinine of 9.37, BUN of 67, GFR of 5 Likely due to obstructive uropathy Lab Results  Component Value Date   CREATININE 9.37 (H) 06/23/2024   CREATININE 1.09 03/03/2021   CREATININE 0.65 10/02/2017   EDP discussed the case with Urologist dr. Shane, and IR Dr. Johann -patient will be evaluated for possible nephrostomy tube placement  - Continue IV fluids, avoid nephrotoxins - will also Consult nephrologist for close evaluation and recommendations

## 2024-06-23 NOTE — Assessment & Plan Note (Signed)
 Elevated blood pressure systolic blood pressure >200, with acute renal failure secondary to possibly obstructive uropathy - Continue as needed IV hydralazine , labetalol  - Medication of Norvasc  to be continued increased from 5 to 10 mg daily -Will add labetalol  200 mg p.o. to twice daily - We are holding home medication of lisinopril , HCTZ,
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
## 2024-06-23 NOTE — Assessment & Plan Note (Signed)
 LFTs within normal limits, will monitor closely

## 2024-06-23 NOTE — Assessment & Plan Note (Signed)
 Serum sodium is 131, -Will monitor closely, continue IV fluid hydration
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
## 2024-06-23 NOTE — ED Notes (Signed)
 CT called at this time for STAT CTA dissection per provider and to waive labs results.

## 2024-06-24 DIAGNOSIS — N179 Acute kidney failure, unspecified: Secondary | ICD-10-CM

## 2024-06-24 LAB — APTT: aPTT: 28 s (ref 24–36)

## 2024-06-24 LAB — COMPREHENSIVE METABOLIC PANEL WITH GFR
ALT: <5 U/L (ref 0–44)
AST: 10 U/L — ABNORMAL LOW (ref 15–41)
Albumin: 2.6 g/dL — ABNORMAL LOW (ref 3.5–5.0)
Alkaline Phosphatase: 37 U/L — ABNORMAL LOW (ref 38–126)
Anion gap: 10 (ref 5–15)
BUN: 57 mg/dL — ABNORMAL HIGH (ref 8–23)
CO2: 22 mmol/L (ref 22–32)
Calcium: 8.2 mg/dL — ABNORMAL LOW (ref 8.9–10.3)
Chloride: 104 mmol/L (ref 98–111)
Creatinine, Ser: 7.13 mg/dL — ABNORMAL HIGH (ref 0.61–1.24)
GFR, Estimated: 7 mL/min — ABNORMAL LOW (ref >60)
Glucose, Bld: 112 mg/dL — ABNORMAL HIGH (ref 70–99)
Potassium: 3.7 mmol/L (ref 3.5–5.1)
Sodium: 136 mmol/L (ref 135–145)
Total Bilirubin: 0.6 mg/dL (ref 0.0–1.2)
Total Protein: 6 g/dL — ABNORMAL LOW (ref 6.5–8.1)

## 2024-06-24 LAB — CBC
HCT: 27 % — ABNORMAL LOW (ref 39.0–52.0)
Hemoglobin: 9.2 g/dL — ABNORMAL LOW (ref 13.0–17.0)
MCH: 32.6 pg (ref 26.0–34.0)
MCHC: 34.1 g/dL (ref 30.0–36.0)
MCV: 95.7 fL (ref 80.0–100.0)
Platelets: 169 K/uL (ref 150–400)
RBC: 2.82 MIL/uL — ABNORMAL LOW (ref 4.22–5.81)
RDW: 13.6 % (ref 11.5–15.5)
WBC: 5.1 K/uL (ref 4.0–10.5)
nRBC: 0 % (ref 0.0–0.2)

## 2024-06-24 LAB — PROTIME-INR
INR: 1.1 (ref 0.8–1.2)
Prothrombin Time: 15.1 s (ref 11.4–15.2)

## 2024-06-24 MED ORDER — LABETALOL HCL 200 MG PO TABS
100.0000 mg | ORAL_TABLET | Freq: Two times a day (BID) | ORAL | Status: DC
  Administered 2024-06-24 – 2024-06-29 (×9): 100 mg via ORAL
  Filled 2024-06-24 (×10): qty 1

## 2024-06-24 MED ORDER — AMLODIPINE BESYLATE 5 MG PO TABS
5.0000 mg | ORAL_TABLET | Freq: Every evening | ORAL | Status: DC
  Administered 2024-06-25 – 2024-06-27 (×3): 5 mg via ORAL
  Filled 2024-06-24 (×4): qty 1

## 2024-06-24 NOTE — Progress Notes (Signed)
 PROGRESS NOTE    [name] [name]  FMW:[id number] DOB: 01-Jul-1951 DOA: 06/23/2024 PCP: [name], NP    Chief Complaint  Patient presents with   Flank Pain    Brief Narrative:   [name] [name] is a 73 year old Male with past medical history of HTN, HLD, seizures, AAA,  vitamin D  deficiency, history of rectal prolapse with correction in 2018... presenting to ED with complaint of left flank/back pain, abdominal pain.  Progressively worsening  pain over past week.  His workup significant for AKI, creatinine up to 9.37, CT angio chest abdomen pelvis significant for left hydronephrosis, left renal parenchymal atrophy, patient was transferred to [facility] for left nephrostomy tube insertion.    Assessment & Plan:   Principal Problem:   Acute renal failure (ARF) (HCC) Active Problems:   Accelerated hypertension   Hydronephrosis, left   Hyponatremia   History of hepatitis C   PAD (peripheral artery disease) (HCC)   AAA (abdominal aortic aneurysm) (HCC)   COPD without exacerbation (HCC)   Hypertension   Seizures (HCC)   Renal insufficiency, ??  AKI Left hydronephrosis Chronic right renal atrophy - Unclear what is his recent baseline, most recent 1.09 in May 2022, reports he is following with [facility] clinic. - With IV fluids. - Continue to monitor labs. - Avoid nephrotoxic medications. - Urology input greatly appreciated, status post left percutaneous nephrostomy tube insertion by IR 8/23 with good urine output.  Will likely need antegrade nephrostogram to evaluate ureters - Avoid nephrotoxic medications   Accelerated hypertension -Blood pressure 230/98 on presentation - Continue as needed IV hydralazine , labetalol  - Medication of Norvasc  to be continued increased from 5 to 10 mg daily -Will add labetalol  200 mg p.o. to twice daily - We are holding home medication of lisinopril , HCTZ,  -Blood pressure on the lower side, decrease both Norvasc  and labetalol , as  well holding parameters, renal input greatly appreciated     Hyponatremia Resolved   COPD without exacerbation (HCC) - No signs of exacerbation -As needed oxygen supplement, DuoNeb bronchodilator treatments   AAA (abdominal aortic aneurysm) (HCC) - CTA reviewed no signs of dissection  No abdominal aortic aneurysm. No dissection. Patent infrarenal aortobifemoral graft.     PAD (peripheral artery disease) (HCC) Not on any antiplatelet treatment including aspirin  Will continue calcium  channel blockers, statins   History of hepatitis C LFTs within normal limits, will monitor closely   Seizures (HCC) - No known recent history of seizure episodes, continue home medication of Depakote         DVT prophylaxis: (Heparin ) Code Status: (Full) Family Communication: (none at bedside) Disposition:   Status is: Inpatient    Consultants:  Renal IR urology   Subjective:  He denies any complaints today, good appetite, no nausea  Objective: Vitals:   06/24/24 0413 06/24/24 0500 06/24/24 0800 06/24/24 1217  BP:   120/62 (!) 111/51  Pulse:   66 61  Resp:   15 13  Temp:   97.8 F (36.6 C) 98.7 F (37.1 C)  TempSrc:   Axillary Oral  SpO2:   93% 92%  Weight: 60.7 kg 60.7 kg    Height:        Intake/Output Summary (Last 24 hours) at 06/24/2024 1448 Last data filed at 06/24/2024 1321 Gross per 24 hour  Intake 1272.65 ml  Output 2550 ml  Net -1277.35 ml   Filed Weights   06/23/24 1032 06/24/24 0413 06/24/24 0500  Weight: 63.6 kg 60.7 kg 60.7  kg    Examination:  Awake Alert, Oriented X 3, No new F.N deficits, Normal affect Symmetrical Chest wall movement, Good air movement bilaterally, CTAB RRR,No Gallops,Rubs or new Murmurs, No Parasternal Heave +ve B.Sounds, Abd Soft, No tenderness, No rebound - guarding or rigidity. No Cyanosis, Clubbing or edema, No new Rash or bruise      Data Reviewed: I have personally reviewed following labs and imaging  studies  CBC: Recent Labs  Lab 06/23/24 1042 06/24/24 0611  WBC 7.8 5.1  NEUTROABS 5.2  --   HGB 12.7* 9.2*  HCT 37.2* 27.0*  MCV 95.6 95.7  PLT 225 169    Basic Metabolic Panel: Recent Labs  Lab 06/23/24 1041 06/23/24 1042 06/24/24 0611  NA  --  131* 136  K  --  4.0 3.7  CL  --  91* 104  CO2  --  23 22  GLUCOSE  --  171* 112*  BUN  --  67* 57*  CREATININE  --  9.37* 7.13*  CALCIUM   --  9.0 8.2*  MG 1.9  --   --   PHOS 6.1*  --   --     GFR: Estimated Creatinine Clearance: 7.7 mL/min (A) (by C-G formula based on SCr of 7.13 mg/dL (H)).  Liver Function Tests: Recent Labs  Lab 06/23/24 1042 06/24/24 0611  AST 15 10*  ALT 6 <5  ALKPHOS 55 37*  BILITOT 1.1 0.6  PROT 8.3* 6.0*  ALBUMIN  3.8 2.6*    CBG: No results for input(s): GLUCAP in the last 168 hours.   Recent Results (from the past 240 hours)  Aerobic/Anaerobic Culture w Gram Stain (surgical/deep wound)     Status: None (Preliminary result)   Collection Time: 06/23/24  6:19 PM   Specimen: Fluid; Urine  Result Value Ref Range Status   Specimen Description FLUID  Final   Special Requests DRAIN, URINE  Final   Gram Stain NO WBC SEEN NO ORGANISMS SEEN   Final   Culture   Final    NO GROWTH < 24 HOURS Performed at [facility], [address] [place], [place] [address]    Report Status PENDING  Incomplete         Radiology Studies: IR NEPHROSTOMY PLACEMENT LEFT Result Date: 06/24/2024 CLINICAL DATA:  Moderate left hydronephrosis. Marked right renal parenchymal atrophy. Elevated creatinine. EXAM: LEFT PERCUTANEOUS NEPHROSTOMY CATHETER PLACEMENT UNDER ULTRASOUND AND FLUOROSCOPIC GUIDANCE FLUOROSCOPY: Radiation Exposure Index (as provided by the fluoroscopic device): 2.3 mGy air Kerma TECHNIQUE: Leftflank region prepped with Betadine, draped in usual sterile fashion, infiltrated locally with 1% lidocaine . Intravenous Fentanyl  50mcg and Versed  1mg  were administered by RN during a total  moderate (conscious) sedation time of 15 minutes; the patient's level of consciousness and physiological / cardiorespiratory status were monitored continuously by radiology RN under my direct supervision. Under real-time ultrasound guidance, a 21-gauge trocar needle was advanced into a posterior upper pole calyx. Ultrasound image documentation was saved. Urine spontaneously returned through the needle. Needle was exchanged over a guidewire for transitional dilator. Contrast injection confirmed appropriate positioning. Catheter was exchanged over a guidewire for a 10 French pigtail catheter, formed centrally within the left renal collecting system. Contrast injection confirms appropriate positioning and patency. Catheter secured externally with 0 Prolene suture and placed to external drain bag. COMPLICATIONS: COMPLICATIONS none IMPRESSION: 1. Technically successful left percutaneous nephrostomy catheter placement. Electronically Signed   By: JONETTA Faes M.D.   On: 06/24/2024 06:53   US  RENAL Result  Date: 06/24/2024 CLINICAL DATA:  [id number] AKI (acute kidney injury) (HCC) [phone] EXAM: RENAL / URINARY TRACT ULTRASOUND COMPLETE COMPARISON:  CT angio chest and abdomen 06/23/2024 FINDINGS: Right Kidney: Renal measurements: 5.2 x 3 x 3.1 cm = volume: 25 mL. Echogenicity within normal limits. No mass or hydronephrosis visualized. Left Kidney: Renal measurements: 12 x 5.1 x 4.7 cm = volume: 152 mL. Nephrostomy tube noted within the left renal pelvis region. Echogenicity within normal limits. No mass or hydronephrosis visualized. Urinary bladder: Appears normal for degree of bladder distention. Other: None. IMPRESSION: 1. Atrophic right kidney. 2.  Left nephrostomy tube in grossly appropriate position. Electronically Signed   By: Morgane  Naveau M.D.   On: 06/24/2024 00:18   CT Angio Chest/Abd/Pel for Dissection W and/or Wo Contrast Result Date: 06/23/2024 EXAM: CTA CHEST, ABDOMEN AND PELVIS WITH AND WITHOUT CONTRAST  06/23/2024 11:14:57 AM TECHNIQUE: CTA of the chest was performed with and without the administration of intravenous contrast. CTA of the abdomen and pelvis was performed with and without the administration of intravenous contrast. Multiplanar reformatted images are provided for review. MIP images are provided for review. Automated exposure control, iterative reconstruction, and/or weight based adjustment of the mA/kV was utilized to reduce the radiation dose to as low as reasonably achievable. COMPARISON: 05/02/2020 and previous. CLINICAL HISTORY: Acute aortic syndrome (AAS) suspected. AAs suspected; Omnipaque  350; 100 cc; c/o left side and LLQ pain. Pt states initial pain started over a week ago in both side and has just came down to the left side now primarily. Pt denies hx of kidney stones or injury. Pt denies complications with urination. Pt states hurts more when moving. FINDINGS: VASCULATURE: Scattered 3-vessel coronary calcifications. Mitral annulus calcifications. AORTA: No acute finding. Aortic atherosclerosis (ICD10-I70.0). No abdominal aortic aneurysm. No dissection. Patent infrarenal aortobifemoral graft. PULMONARY ARTERIES: No pulmonary embolism with the limits of this exam. GREAT VESSELS OF AORTIC ARCH: No acute finding. No dissection. No arterial occlusion or significant stenosis. CELIAC TRUNK: No acute finding. No occlusion or significant stenosis. SUPERIOR MESENTERIC ARTERY: Calcified plaque at the origin of the superior mesenteric artery resulting in short segment stenosis of at least moderate severity, atheromatous but patent distally. INFERIOR MESENTERIC ARTERY: Proximal occlusion likely related to graft placement RENAL ARTERIES: Proximal occlusion of the right renal artery . 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 Heavily atheromatous native bilateral external and internal iliac arteries. CHEST: MEDIASTINUM: No mediastinal lymphadenopathy. The heart and pericardium  demonstrate no acute abnormality. LUNGS AND PLEURA: Small left pleural effusion. Pulmonary emphysema. Chronic linear scarring/atelectasis posteriorly in both lower lobes. THORACIC BONES AND SOFT TISSUES: Chronic T9 vertebral compression deformity. NO ACUTE BONE OR SOFT TISSUE ABNORMALITY. ABDOMEN AND PELVIS: LIVER: The liver is unremarkable. GALLBLADDER AND BILE DUCTS: Gallbladder is unremarkable. No biliary ductal dilatation. SPLEEN: The spleen is unremarkable. PANCREAS: The pancreas is unremarkable. ADRENAL GLANDS: Bilateral adrenal glands demonstrate no acute abnormality. KIDNEYS, URETERS AND BLADDER: Marked right renal parenchymal atrophy, progressive since 01/16/2017. Moderate left hydronephrosis. No definite urolithiasis. The ureter appears nondistended. Urinary bladder decompressed. GI AND BOWEL: Stomach and duodenal sweep demonstrate no acute abnormality. There is no bowel obstruction. No abnormal bowel wall thickening or distension. REPRODUCTIVE: Reproductive organs are unremarkable. PERITONEUM AND RETROPERITONEUM: Small volume abdominal and pelvic ascites. No free air. LYMPH NODES: No lymphadenopathy. ABDOMINAL BONES AND SOFT TISSUES: No acute soft tissue abnormality. Multilevel lumbar spondylotic change most marked L3-S1. IMPRESSION: 1. No evidence of acute aortic syndrome. 2. Marked right renal parenchymal atrophy, progressive  since 12/20/19. 3. Moderate left hydronephrosis without definite urolithiasis. The ureter appears nondistended. 4. Small left pleural effusion. Electronically signed by: Dayne Hassell MD 06/23/2024 12:12 PM EDT RP Workstation: [id number]        Scheduled Meds:  amLODipine   5 mg Oral QPM   atorvastatin   40 mg Oral Daily   divalproex   1,000 mg Oral QHS   heparin   5,000 Units Subcutaneous Q8H   labetalol   100 mg Oral BID   magnesium  oxide  200 mg Oral Daily   sodium chloride  flush  3 mL Intravenous Q12H   sodium chloride  flush  3 mL Intravenous Q12H   sodium chloride   flush  5 mL Intracatheter Q8H   Continuous Infusions:   LOS: 1 day      Servando Kyllonen, MD [facility]   To contact the attending provider between 7A-7P or the covering provider during after hours 7P-7A, please log into the web site [email or web address] and access using universal [place] password for that web site. If you do not have the password, please call the hospital operator.  06/24/2024, 2:48 PM

## 2024-06-24 NOTE — Progress Notes (Addendum)
 [facility] Progress Note  Subjective:  2000 cc UOP since L PCN done yest around 6 p No new c/o's, eating breakfast No uremic symptoms  Vitals:   06/24/24 0357 06/24/24 0413 06/24/24 0500 06/24/24 0800  BP: 114/60   120/62  Pulse: (!) 58   66  Resp: 13   15  Temp: 97.9 F (36.6 C)   97.8 F (36.6 C)  TempSrc: Oral   Axillary  SpO2: 94%   93%  Weight:  60.7 kg 60.7 kg   Height:        Exam: Gen alert, no distress No jvd or bruits Chest decrease in normal breath sounds RRR no MRG Abd soft ntnd no mass or ascites +bs Ext no LE or UE edema Neuro is alert, Ox 3 , nf       Home bp meds: Norvasc  5 hs Bisoprolol- hydrochlorothiazide 10-6.25 every day Lisinopril  40mg  at bedtime   Date                             Creat               eGFR (ml/min) 2012- dec 2018           0.60- 0.94         03/03/21                        1.09                 73 ml/min 06/23/24                        9.37                 5 ml/min   UA - small Hb, prot neg, 0-5 rbc/wbc UNa 85, UCr 47 CTA today: R kidney atrophic (was normal size on the 2018 CTA study), L kidney moderate hydronephrosis, no definite urolithiasis, ureter appears non-distended and urinary bladder decompressed. Renal US  8/23 post PCN -> R 5.2 cm / L 12 cm w/ nephrostomy tube in place, no hydronephrosis either side.    Assessment/ Plan: Renal failure: last creat was 1.09 in may 2022, eGFR 74 ml/min. Admit creatinine was 9.3 in the setting of 1 week of L flank pain. CT showed sig L sided hydronephrosis and atrophic R kidney. UA was negative. Renal US  done post-PCN showed 12cm L kidney w/ pcn tube in proper position, no hydro; also R kidney atrophic at 5 cm, not likely functional at this size.  AKI likely due to L-sided obstruction. May also have some CKD atrophic R kidney but not possible to say how significant at this point. IR placed L PCN 8/23 w/ good UOP overnight thru the drain. Creatinine down to 7 today. No uremic  symptoms. Urology is also following. F/u labs in am, cont renal diet for now and cont IVF's 75 cc/hr.  Uncont HTN: bp 230/ 98 on presentation. Home ACEi and hydrochlorothiazide on hold appropriately. Getting norvasc  + po labetalol  here. Bp have dropped to normal/ low-normal. Will lower norvasc  and labetalol  by 50% and add hold orders for SBP < 125.  COPD: not on any O2             Myer Fret MD  [facility] 06/24/2024, 10:11 AM  Recent Labs  Lab 06/23/24 1041 06/23/24 1042 06/24/24 0611  HGB  --  12.7* 9.2*  ALBUMIN   --  3.8 2.6*  CALCIUM   --  9.0 8.2*  PHOS 6.1*  --   --   CREATININE  --  9.37* 7.13*  K  --  4.0 3.7   No results for input(s): IRON, TIBC, FERRITIN in the last 168 hours. Inpatient medications:  amLODipine   10 mg Oral QPM   atorvastatin   40 mg Oral Daily   divalproex   1,000 mg Oral QHS   heparin   5,000 Units Subcutaneous Q8H   labetalol   200 mg Oral BID   magnesium  oxide  200 mg Oral Daily   sodium chloride  flush  3 mL Intravenous Q12H   sodium chloride  flush  3 mL Intravenous Q12H   sodium chloride  flush  5 mL Intracatheter Q8H    sodium chloride  100 mL/hr at 06/24/24 0043   acetaminophen  **OR** acetaminophen , bisacodyl , hydrALAZINE , HYDROmorphone  (DILAUDID ) injection, ipratropium, labetalol , levalbuterol , ondansetron  **OR** ondansetron  (ZOFRAN ) IV, oxyCODONE , senna-docusate, sodium phosphate , traZODone 

## 2024-06-24 NOTE — Progress Notes (Signed)
 Chaplain responded to AD spiritual consult. Paperwork and information provided. Pt [name] [name] expressed gratitude for chaplain presence, and we remain available as needs arise.

## 2024-06-24 NOTE — Evaluation (Signed)
 Physical Therapy Evaluation Patient Details Name: [name] [name] [name] MRN: [id number] DOB: Jan 02, 1951 Today's Date: 06/24/2024  History of Present Illness  73 y.o. male presents to [facility] 06/23/24 with L flank/back pain and abdominal pain. Pt with L hydronephrosis w/ acute renal failure. 8/23 L perc nephrostomy placement. PMHx: HTN, HLD, seizures, AAA,  vitamin D  deficiency, COPD, PAD   Clinical Impression  PTA, pt was independent for mobility with no AD. Limited PT eval due to low BP readings and pt wanting to rest. Vitals below with pt asymptomatic throughout. Able to perform bed mobility and stand with CGA and no AD. Anticipate pt will have no post-acute PT needs, however, will continue to assess as pt is able to mobilize. Pt has intermittent level of assist from friends upon returning home. Pt would benefit from acute skilled PT with current functional limitations listed below (see PT Problem List). Acute PT to follow.    BP Readings Supine BP- 98/57 (68) Seated BP- 85/61 (70), 64 BPM Standing BP- 90/46 (59), 64 BPM      If plan is discharge home, recommend the following: A little help with walking and/or transfers;Assist for transportation;Help with stairs or ramp for entrance   Can travel by private vehicle    Yes    Equipment Recommendations Other (comment) (TBD)     Functional Status Assessment Patient has had a recent decline in their functional status and demonstrates the ability to make significant improvements in function in a reasonable and predictable amount of time.     Precautions / Restrictions Precautions Precautions: Fall Precaution/Restrictions Comments: L nephrostomy tube, watch BP Restrictions Weight Bearing Restrictions Per Provider Order: No      Mobility  Bed Mobility Overal bed mobility: Needs Assistance Bed Mobility: Supine to Sit, Sit to Supine    Supine to sit: Contact guard, HOB elevated Sit to supine: Contact guard assist, HOB elevated   General bed  mobility comments: for safety, increased time for supine<>sit due to pain    Transfers Overall transfer level: Needs assistance Equipment used: None Transfers: Sit to/from Stand Sit to Stand: Contact guard assist    General transfer comment: no physical assist needed, CGA for safety    Ambulation/Gait  General Gait Details: deferred 2/2 low BP    Balance Overall balance assessment: Mild deficits observed, not formally tested            Pertinent Vitals/Pain Pain Assessment Pain Assessment: Faces Faces Pain Scale: Hurts little more Pain Location: nephrostomy tube insertion Pain Descriptors / Indicators: Discomfort, Grimacing Pain Intervention(s): Limited activity within patient's tolerance, Monitored during session, Repositioned    Home Living Family/patient expects to be discharged to:: Private residence Living Arrangements: Alone Available Help at Discharge: Friend(s);Available PRN/intermittently Type of Home: House Home Access: Stairs to enter Entrance Stairs-Rails: Doctor, general practice of Steps: 4   Home Layout: One level Home Equipment: None      Prior Function Prior Level of Function : Independent/Modified Independent;Driving    Mobility Comments: Ind with no AD ADLs Comments: Ind     Extremity/Trunk Assessment   Upper Extremity Assessment Upper Extremity Assessment: Defer to OT evaluation    Lower Extremity Assessment Lower Extremity Assessment: Overall WFL for tasks assessed    Cervical / Trunk Assessment Cervical / Trunk Assessment: Normal  Communication   Communication Communication: Impaired Factors Affecting Communication: Hearing impaired    Cognition Arousal: Alert Behavior During Therapy: WFL for tasks assessed/performed   PT - Cognitive impairments: Safety/Judgement  Following commands: Intact       Cueing Cueing Techniques: Verbal cues, Tactile cues      PT Assessment Patient needs continued PT services   PT Problem List Decreased activity tolerance;Decreased balance;Decreased mobility       PT Treatment Interventions DME instruction;Gait training;Stair training;Functional mobility training;Therapeutic activities;Therapeutic exercise;Balance training;Neuromuscular re-education;Patient/family education    PT Goals (Current goals can be found in the Care Plan section)  Acute Rehab PT Goals Patient Stated Goal: to go home to his dogs PT Goal Formulation: With patient Time For Goal Achievement: 07/08/24 Potential to Achieve Goals: Good    Frequency Min 2X/week        AM-PAC PT 6 Clicks Mobility  Outcome Measure Help needed turning from your back to your side while in a flat bed without using bedrails?: A Little Help needed moving from lying on your back to sitting on the side of a flat bed without using bedrails?: A Little Help needed moving to and from a bed to a chair (including a wheelchair)?: A Little Help needed standing up from a chair using your arms (e.g., wheelchair or bedside chair)?: A Little Help needed to walk in hospital room?: A Little Help needed climbing 3-5 steps with a railing? : A Little 6 Click Score: 18    End of Session Equipment Utilized During Treatment: Gait belt Activity Tolerance: Other (comment) (low BP, pt also declining further assessment) Patient left: in bed;with call bell/phone within reach;with bed alarm set Nurse Communication: Mobility status;Other (comment) (BP readings) PT Visit Diagnosis: Other abnormalities of gait and mobility (R26.89)    Time: 8947-8887 PT Time Calculation (min) (ACUTE ONLY): 20 min   Charges:   PT Evaluation $PT Eval Low Complexity: 1 Low   PT General Charges $$ ACUTE PT VISIT: 1 Visit       [name] [name] [name], PT, DPT Secure Chat Preferred  Rehab Office [phone]   [name] [name] [name] [name] [name] [name] 06/24/2024, 11:30 AM

## 2024-06-24 NOTE — Progress Notes (Signed)
 OT Cancellation Note  Patient Details Name: [name] [name] MRN: [id number] DOB: 07/15/1951   Cancelled Treatment:    Reason Eval/Treat Not Completed: Medical issues which prohibited therapy (hypotensive with PT, now fatigued and wanting to rest. Will follow up for OT eval next date as schedule permits)  Mackenzi Krogh K, OTD, OTR/L SecureChat Preferred Acute Rehab [phone]   Laneta MARLA Pereyra 06/24/2024, 12:15 PM

## 2024-06-25 DIAGNOSIS — N1330 Unspecified hydronephrosis: Secondary | ICD-10-CM | POA: Diagnosis not present

## 2024-06-25 DIAGNOSIS — N179 Acute kidney failure, unspecified: Secondary | ICD-10-CM | POA: Diagnosis not present

## 2024-06-25 DIAGNOSIS — D649 Anemia, unspecified: Secondary | ICD-10-CM

## 2024-06-25 LAB — VITAMIN B12: Vitamin B-12: 227 pg/mL (ref 180–914)

## 2024-06-25 LAB — CBC
HCT: 24.6 % — ABNORMAL LOW (ref 39.0–52.0)
Hemoglobin: 8.2 g/dL — ABNORMAL LOW (ref 13.0–17.0)
MCH: 32 pg (ref 26.0–34.0)
MCHC: 33.3 g/dL (ref 30.0–36.0)
MCV: 96.1 fL (ref 80.0–100.0)
Platelets: 164 K/uL (ref 150–400)
RBC: 2.56 MIL/uL — ABNORMAL LOW (ref 4.22–5.81)
RDW: 13.6 % (ref 11.5–15.5)
WBC: 5.2 K/uL (ref 4.0–10.5)
nRBC: 0 % (ref 0.0–0.2)

## 2024-06-25 LAB — MAGNESIUM: Magnesium: 1.4 mg/dL — ABNORMAL LOW (ref 1.7–2.4)

## 2024-06-25 LAB — COMPREHENSIVE METABOLIC PANEL WITH GFR
ALT: 5 U/L (ref 0–44)
AST: 12 U/L — ABNORMAL LOW (ref 15–41)
Albumin: 2.4 g/dL — ABNORMAL LOW (ref 3.5–5.0)
Alkaline Phosphatase: 33 U/L — ABNORMAL LOW (ref 38–126)
Anion gap: 12 (ref 5–15)
BUN: 49 mg/dL — ABNORMAL HIGH (ref 8–23)
CO2: 20 mmol/L — ABNORMAL LOW (ref 22–32)
Calcium: 8.1 mg/dL — ABNORMAL LOW (ref 8.9–10.3)
Chloride: 103 mmol/L (ref 98–111)
Creatinine, Ser: 5.52 mg/dL — ABNORMAL HIGH (ref 0.61–1.24)
GFR, Estimated: 10 mL/min — ABNORMAL LOW (ref >60)
Glucose, Bld: 94 mg/dL (ref 70–99)
Potassium: 3.3 mmol/L — ABNORMAL LOW (ref 3.5–5.1)
Sodium: 135 mmol/L (ref 135–145)
Total Bilirubin: 0.6 mg/dL (ref 0.0–1.2)
Total Protein: 5.7 g/dL — ABNORMAL LOW (ref 6.5–8.1)

## 2024-06-25 LAB — RETICULOCYTES
Immature Retic Fract: 10.6 % (ref 2.3–15.9)
RBC.: 2.46 MIL/uL — ABNORMAL LOW (ref 4.22–5.81)
Retic Count, Absolute: 27.8 K/uL (ref 19.0–186.0)
Retic Ct Pct: 1.1 % (ref 0.4–3.1)

## 2024-06-25 LAB — IRON AND TIBC
Iron: 63 ug/dL (ref 45–182)
Saturation Ratios: 29 % (ref 17.9–39.5)
TIBC: 218 ug/dL — ABNORMAL LOW (ref 250–450)
UIBC: 155 ug/dL

## 2024-06-25 LAB — GLUCOSE, CAPILLARY
Glucose-Capillary: 98 mg/dL (ref 70–99)
Glucose-Capillary: 93 mg/dL (ref 70–99)

## 2024-06-25 LAB — FERRITIN: Ferritin: 249 ng/mL (ref 24–336)

## 2024-06-25 LAB — FOLATE: Folate: 5.9 ng/mL — ABNORMAL LOW (ref >5.9)

## 2024-06-25 MED ORDER — MAGNESIUM SULFATE 2 GM/50ML IV SOLN
2.0000 g | Freq: Once | INTRAVENOUS | Status: AC
  Administered 2024-06-25: 2 g via INTRAVENOUS
  Filled 2024-06-25: qty 50

## 2024-06-25 MED ORDER — SODIUM CHLORIDE 0.9 % IV BOLUS: 500.0000 mL | Freq: Once | INTRAVENOUS | Status: DC

## 2024-06-25 MED ORDER — CYANOCOBALAMIN 1000 MCG/ML IJ SOLN
1000.0000 ug | Freq: Every day | INTRAMUSCULAR | Status: DC
  Administered 2024-06-25 – 2024-06-29 (×4): 1000 ug via SUBCUTANEOUS
  Filled 2024-06-25 (×5): qty 1

## 2024-06-25 MED ORDER — FOLIC ACID 1 MG PO TABS
1.0000 mg | ORAL_TABLET | Freq: Every day | ORAL | Status: DC
  Administered 2024-06-25 – 2024-06-27 (×3): 1 mg via ORAL
  Filled 2024-06-25 (×3): qty 1

## 2024-06-25 MED ORDER — SODIUM CHLORIDE 0.9 % IV SOLN: INTRAVENOUS | Status: DC

## 2024-06-25 MED ORDER — POTASSIUM CHLORIDE CRYS ER 20 MEQ PO TBCR
40.0000 meq | EXTENDED_RELEASE_TABLET | Freq: Four times a day (QID) | ORAL | Status: AC
  Administered 2024-06-25 (×2): 40 meq via ORAL
  Filled 2024-06-25 (×2): qty 2

## 2024-06-25 MED ORDER — NICOTINE 21 MG/24HR TD PT24
21.0000 mg | MEDICATED_PATCH | Freq: Every day | TRANSDERMAL | Status: DC
  Administered 2024-06-25 – 2024-06-27 (×3): 21 mg via TRANSDERMAL
  Filled 2024-06-25 (×4): qty 1

## 2024-06-25 NOTE — Progress Notes (Signed)
 Subjective: Doing well sleeping comfortably.  Patient woke up from sleep denies any pain nephrostomy tube is draining well with no issues with urination.  Denies nausea vomiting fevers chills.  Objective: Vital signs in last 24 hours: Temp:  [97.7 F (36.5 C)-98.7 F (37.1 C)] 97.9 F (36.6 C) (08/25 0412) Pulse Rate:  [59-68] 62 (08/25 0412) Resp:  [13-19] 14 (08/25 0412) BP: (98-136)/(49-64) 126/58 (08/25 0412) SpO2:  [91 %-95 %] 93 % (08/25 0412) Weight:  [62.7 kg] 62.7 kg (08/25 0500)  Intake/Output from previous day: 08/24 0701 - 08/25 0700 In: 1073 [P.O.:1065; I.V.:8] Out: 1400 [Urine:1400] Intake/Output this shift: No intake/output data recorded.  Physical Exam:  General: Alert and oriented CV: RRR Lungs: Clear Abdomen: Soft, ND, ATTP;  GU: Left nephrostomy tube in place draining clear yellow urine.  Lab Results: Recent Labs    06/23/24 1042 06/24/24 0611 06/25/24 0601  HGB 12.7* 9.2* 8.2*  HCT 37.2* 27.0* 24.6*   BMET Recent Labs    06/24/24 0611 06/25/24 0601  NA 136 135  K 3.7 3.3*  CL 104 103  CO2 22 20*  GLUCOSE 112* 94  BUN 57* 49*  CREATININE 7.13* 5.52*  CALCIUM  8.2* 8.1*     Studies/Results: IR NEPHROSTOMY PLACEMENT LEFT Result Date: 06/24/2024 CLINICAL DATA:  Moderate left hydronephrosis. Marked right renal parenchymal atrophy. Elevated creatinine. EXAM: LEFT PERCUTANEOUS NEPHROSTOMY CATHETER PLACEMENT UNDER ULTRASOUND AND FLUOROSCOPIC GUIDANCE FLUOROSCOPY: Radiation Exposure Index (as provided by the fluoroscopic device): 2.3 mGy air Kerma TECHNIQUE: Leftflank region prepped with Betadine, draped in usual sterile fashion, infiltrated locally with 1% lidocaine . Intravenous Fentanyl  50mcg and Versed  1mg  were administered by RN during a total moderate (conscious) sedation time of 15 minutes; the patient's level of consciousness and physiological / cardiorespiratory status were monitored continuously by radiology RN under my direct supervision.  Under real-time ultrasound guidance, a 21-gauge trocar needle was advanced into a posterior upper pole calyx. Ultrasound image documentation was saved. Urine spontaneously returned through the needle. Needle was exchanged over a guidewire for transitional dilator. Contrast injection confirmed appropriate positioning. Catheter was exchanged over a guidewire for a 10 French pigtail catheter, formed centrally within the left renal collecting system. Contrast injection confirms appropriate positioning and patency. Catheter secured externally with 0 Prolene suture and placed to external drain bag. COMPLICATIONS: COMPLICATIONS none IMPRESSION: 1. Technically successful left percutaneous nephrostomy catheter placement. Electronically Signed   By: JONETTA Faes M.D.   On: 06/24/2024 06:53   US  RENAL Result Date: 06/24/2024 CLINICAL DATA:  [id number] AKI (acute kidney injury) (HCC) [id number] EXAM: RENAL / URINARY TRACT ULTRASOUND COMPLETE COMPARISON:  CT angio chest and abdomen 06/23/2024 FINDINGS: Right Kidney: Renal measurements: 5.2 x 3 x 3.1 cm = volume: 25 mL. Echogenicity within normal limits. No mass or hydronephrosis visualized. Left Kidney: Renal measurements: 12 x 5.1 x 4.7 cm = volume: 152 mL. Nephrostomy tube noted within the left renal pelvis region. Echogenicity within normal limits. No mass or hydronephrosis visualized. Urinary bladder: Appears normal for degree of bladder distention. Other: None. IMPRESSION: 1. Atrophic right kidney. 2.  Left nephrostomy tube in grossly appropriate position. Electronically Signed   By: Morgane  Naveau M.D.   On: 06/24/2024 00:18   CT Angio Chest/Abd/Pel for Dissection W and/or Wo Contrast Result Date: 06/23/2024 EXAM: CTA CHEST, ABDOMEN AND PELVIS WITH AND WITHOUT CONTRAST 06/23/2024 11:14:57 AM TECHNIQUE: CTA of the chest was performed with and without the administration of intravenous contrast. CTA of the abdomen and pelvis was performed with and  without the administration of  intravenous contrast. Multiplanar reformatted images are provided for review. MIP images are provided for review. Automated exposure control, iterative reconstruction, and/or weight based adjustment of the mA/kV was utilized to reduce the radiation dose to as low as reasonably achievable. COMPARISON: 05/02/2020 and previous. CLINICAL HISTORY: Acute aortic syndrome (AAS) suspected. AAs suspected; Omnipaque  350; 100 cc; c/o left side and LLQ pain. Pt states initial pain started over a week ago in both side and has just came down to the left side now primarily. Pt denies hx of kidney stones or injury. Pt denies complications with urination. Pt states hurts more when moving. FINDINGS: VASCULATURE: Scattered 3-vessel coronary calcifications. Mitral annulus calcifications. AORTA: No acute finding. Aortic atherosclerosis (ICD10-I70.0). No abdominal aortic aneurysm. No dissection. Patent infrarenal aortobifemoral graft. PULMONARY ARTERIES: No pulmonary embolism with the limits of this exam. GREAT VESSELS OF AORTIC ARCH: No acute finding. No dissection. No arterial occlusion or significant stenosis. CELIAC TRUNK: No acute finding. No occlusion or significant stenosis. SUPERIOR MESENTERIC ARTERY: Calcified plaque at the origin of the superior mesenteric artery resulting in short segment stenosis of at least moderate severity, atheromatous but patent distally. INFERIOR MESENTERIC ARTERY: Proximal occlusion likely related to graft placement RENAL ARTERIES: Proximal occlusion of the right renal artery . 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 Heavily atheromatous native bilateral external and internal iliac arteries. CHEST: MEDIASTINUM: No mediastinal lymphadenopathy. The heart and pericardium demonstrate no acute abnormality. LUNGS AND PLEURA: Small left pleural effusion. Pulmonary emphysema. Chronic linear scarring/atelectasis posteriorly in both lower lobes. THORACIC BONES AND SOFT TISSUES:  Chronic T9 vertebral compression deformity. NO ACUTE BONE OR SOFT TISSUE ABNORMALITY. ABDOMEN AND PELVIS: LIVER: The liver is unremarkable. GALLBLADDER AND BILE DUCTS: Gallbladder is unremarkable. No biliary ductal dilatation. SPLEEN: The spleen is unremarkable. PANCREAS: The pancreas is unremarkable. ADRENAL GLANDS: Bilateral adrenal glands demonstrate no acute abnormality. KIDNEYS, URETERS AND BLADDER: Marked right renal parenchymal atrophy, progressive since 01/16/2017. Moderate left hydronephrosis. No definite urolithiasis. The ureter appears nondistended. Urinary bladder decompressed. GI AND BOWEL: Stomach and duodenal sweep demonstrate no acute abnormality. There is no bowel obstruction. No abnormal bowel wall thickening or distension. REPRODUCTIVE: Reproductive organs are unremarkable. PERITONEUM AND RETROPERITONEUM: Small volume abdominal and pelvic ascites. No free air. LYMPH NODES: No lymphadenopathy. ABDOMINAL BONES AND SOFT TISSUES: No acute soft tissue abnormality. Multilevel lumbar spondylotic change most marked L3-S1. IMPRESSION: 1. No evidence of acute aortic syndrome. 2. Marked right renal parenchymal atrophy, progressive since 12/20/19. 3. Moderate left hydronephrosis without definite urolithiasis. The ureter appears nondistended. 4. Small left pleural effusion. Electronically signed by: Katheleen Faes MD 06/23/2024 12:12 PM EDT RP Workstation: [id number]    Assessment/Plan: 73 year old male with a history of vascular disease and aortofemoral bypass presented to the ER in renal failure, atrophic right kidney and left severe hydronephrosis.  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  # Left hydronephrosis - Nephrostomy tube in place working well - Continue nephrostomy tube throughout duration patient will need to be discharged with nephrostomy tube - Will set up outpatient follow-up for discussion of management  # Renal failure - Creatinine  decreasing labs pending today continue nephrostomy tube  # Atrophic right kidney - No hydronephrosis does not appear that drainage is necessary for the right side.   LOS: 2 days   Jackey Pea MD 06/25/2024, 7:19 AM [facility]

## 2024-06-25 NOTE — Progress Notes (Signed)
 PROGRESS NOTE    [name]  FMW:[id number] DOB: 06-Mar-1951 DOA: 06/23/2024 PCP: [name], NP    Chief Complaint  Patient presents with   Flank Pain    Brief Narrative:   [name] [name] is a 73 year old Male with past medical history of HTN, HLD, seizures, AAA,  vitamin D  deficiency, history of rectal prolapse with correction in 2018... presenting to ED with complaint of left flank/back pain, abdominal pain.  Progressively worsening  pain over past week.  His workup significant for AKI, creatinine up to 9.37, CT angio chest abdomen pelvis significant for left hydronephrosis, left renal parenchymal atrophy, patient was transferred to [facility] for left nephrostomy tube insertion.    Assessment & Plan:   Principal Problem:   Acute renal failure (ARF) (HCC) Active Problems:   Accelerated hypertension   Hydronephrosis, left   Hyponatremia   History of hepatitis C   PAD (peripheral artery disease) (HCC)   AAA (abdominal aortic aneurysm) (HCC)   COPD without exacerbation (HCC)   Hypertension   Seizures (HCC)   Renal insufficiency, ??  AKI Left hydronephrosis Chronic right renal atrophy - Unclear what is his recent baseline, most recent 1.09 in May 2022, reports he is following with [facility] clinic. - Continue with IV fluids at 75 cc/h as per renal recommendations- Continue to monitor labs. - Avoid nephrotoxic medications. - Urology input greatly appreciated, status post left percutaneous nephrostomy tube insertion by IR 8/23 with good urine output.  Will likely need antegrade nephrostogram to evaluate ureters - Avoid nephrotoxic medications - Renal function improving, On admission, it is 5.5 today, continue to monitor daily   Accelerated hypertension -Blood pressure 230/98 on presentation - Continue as needed IV hydralazine , labetalol  - Medication of Norvasc  to be continued increased from 5 to 10 mg daily -Will add labetalol  200 mg p.o. to twice daily -  We are holding home medication of lisinopril , HCTZ,  -Blood pressure on the lower side, decrease both Norvasc  and labetalol , as well holding parameters, renal input greatly appreciated     Hyponatremia Resolved   COPD without exacerbation (HCC) - No signs of exacerbation -As needed oxygen supplement, DuoNeb bronchodilator treatments   AAA (abdominal aortic aneurysm) (HCC) - CTA reviewed no signs of dissection  No abdominal aortic aneurysm. No dissection. Patent infrarenal aortobifemoral graft.     PAD (peripheral artery disease) (HCC) Not on any antiplatelet treatment including aspirin  Will continue calcium  channel blockers, statins   History of hepatitis C LFTs within normal limits, will monitor closely   Seizures (HCC) - No known recent history of seizure episodes, continue home medication of Depakote   Anemia Low B12 Low folic acid  -Hemoglobin down to 8.2 this morning, this is most likely delusional effect, anemia panel has been obtained, workup significant for low B12 and folic acid , started on supplement        DVT prophylaxis: (Heparin ) Code Status: (Full) Family Communication: (none at bedside) Disposition:   Status is: Inpatient    Consultants:  Renal IR urology   Subjective:  No significant events overnight as discussed with staff, he had a good night sleep  Objective: Vitals:   06/25/24 0500 06/25/24 0900 06/25/24 1128 06/25/24 1236  BP:  (!) 140/63 133/61 (!) 145/65  Pulse:   60   Resp:   (!) 21   Temp:  97.9 F (36.6 C)  97.7 F (36.5 C)  TempSrc:  Oral    SpO2:   92%   Weight: 62.7  kg     Height:        Intake/Output Summary (Last 24 hours) at 06/25/2024 1303 Last data filed at 06/25/2024 1131 Gross per 24 hour  Intake 728 ml  Output 1200 ml  Net -472 ml   Filed Weights   06/24/24 0413 06/24/24 0500 06/25/24 0500  Weight: 60.7 kg 60.7 kg 62.7 kg    Examination:  Awake Alert, in no apparent distress Symmetrical Chest wall  movement, Good air movement bilaterally, CTAB RRR,No Gallops,Rubs or new Murmurs, No Parasternal Heave +ve B.Sounds, Abd Soft, No tenderness, left nephrostomy present No Cyanosis, Clubbing or edema, No new Rash or bruise       Data Reviewed: I have personally reviewed following labs and imaging studies  CBC: Recent Labs  Lab 06/23/24 1042 06/24/24 0611 06/25/24 0601  WBC 7.8 5.1 5.2  NEUTROABS 5.2  --   --   HGB 12.7* 9.2* 8.2*  HCT 37.2* 27.0* 24.6*  MCV 95.6 95.7 96.1  PLT 225 169 164    Basic Metabolic Panel: Recent Labs  Lab 06/23/24 1041 06/23/24 1042 06/24/24 0611 06/25/24 0601  NA  --  131* 136 135  K  --  4.0 3.7 3.3*  CL  --  91* 104 103  CO2  --  23 22 20*  GLUCOSE  --  171* 112* 94  BUN  --  67* 57* 49*  CREATININE  --  9.37* 7.13* 5.52*  CALCIUM   --  9.0 8.2* 8.1*  MG 1.9  --   --  1.4*  PHOS 6.1*  --   --   --     GFR: Estimated Creatinine Clearance: 10.3 mL/min (A) (by C-G formula based on SCr of 5.52 mg/dL (H)).  Liver Function Tests: Recent Labs  Lab 06/23/24 1042 06/24/24 0611 06/25/24 0601  AST 15 10* 12*  ALT 6 <5 5  ALKPHOS 55 37* 33*  BILITOT 1.1 0.6 0.6  PROT 8.3* 6.0* 5.7*  ALBUMIN  3.8 2.6* 2.4*    CBG: Recent Labs  Lab 06/25/24 0527 06/25/24 0902  GLUCAP 98 93     Recent Results (from the past 240 hours)  Aerobic/Anaerobic Culture w Gram Stain (surgical/deep wound)     Status: None (Preliminary result)   Collection Time: 06/23/24  6:19 PM   Specimen: Fluid; Urine  Result Value Ref Range Status   Specimen Description FLUID  Final   Special Requests DRAIN, URINE  Final   Gram Stain NO WBC SEEN NO ORGANISMS SEEN   Final   Culture   Final    NO GROWTH 2 DAYS Performed at [facility], [address] [place], [place] [address]    Report Status PENDING  Incomplete         Radiology Studies: IR NEPHROSTOMY PLACEMENT LEFT Result Date: 06/24/2024 CLINICAL DATA:  Moderate left hydronephrosis. Marked right  renal parenchymal atrophy. Elevated creatinine. EXAM: LEFT PERCUTANEOUS NEPHROSTOMY CATHETER PLACEMENT UNDER ULTRASOUND AND FLUOROSCOPIC GUIDANCE FLUOROSCOPY: Radiation Exposure Index (as provided by the fluoroscopic device): 2.3 mGy air Kerma TECHNIQUE: Leftflank region prepped with Betadine, draped in usual sterile fashion, infiltrated locally with 1% lidocaine . Intravenous Fentanyl  50mcg and Versed  1mg  were administered by RN during a total moderate (conscious) sedation time of 15 minutes; the patient's level of consciousness and physiological / cardiorespiratory status were monitored continuously by radiology RN under my direct supervision. Under real-time ultrasound guidance, a 21-gauge trocar needle was advanced into a posterior upper pole calyx. Ultrasound image documentation was saved. Urine  spontaneously returned through the needle. Needle was exchanged over a guidewire for transitional dilator. Contrast injection confirmed appropriate positioning. Catheter was exchanged over a guidewire for a 10 French pigtail catheter, formed centrally within the left renal collecting system. Contrast injection confirms appropriate positioning and patency. Catheter secured externally with 0 Prolene suture and placed to external drain bag. COMPLICATIONS: COMPLICATIONS none IMPRESSION: 1. Technically successful left percutaneous nephrostomy catheter placement. Electronically Signed   By: JONETTA Faes M.D.   On: 06/24/2024 06:53   US  RENAL Result Date: 06/24/2024 CLINICAL DATA:  [id number] AKI (acute kidney injury) (HCC) [id number] EXAM: RENAL / URINARY TRACT ULTRASOUND COMPLETE COMPARISON:  CT angio chest and abdomen 06/23/2024 FINDINGS: Right Kidney: Renal measurements: 5.2 x 3 x 3.1 cm = volume: 25 mL. Echogenicity within normal limits. No mass or hydronephrosis visualized. Left Kidney: Renal measurements: 12 x 5.1 x 4.7 cm = volume: 152 mL. Nephrostomy tube noted within the left renal pelvis region. Echogenicity within normal  limits. No mass or hydronephrosis visualized. Urinary bladder: Appears normal for degree of bladder distention. Other: None. IMPRESSION: 1. Atrophic right kidney. 2.  Left nephrostomy tube in grossly appropriate position. Electronically Signed   By: Morgane  Naveau M.D.   On: 06/24/2024 00:18        Scheduled Meds:  amLODipine   5 mg Oral QPM   atorvastatin   40 mg Oral Daily   cyanocobalamin   1,000 mcg Subcutaneous Daily   divalproex   1,000 mg Oral QHS   folic acid   1 mg Oral Daily   heparin   5,000 Units Subcutaneous Q8H   labetalol   100 mg Oral BID   magnesium  oxide  200 mg Oral Daily   potassium chloride   40 mEq Oral Q6H   sodium chloride  flush  3 mL Intravenous Q12H   sodium chloride  flush  3 mL Intravenous Q12H   sodium chloride  flush  5 mL Intracatheter Q8H   Continuous Infusions:  sodium chloride        LOS: 2 days      Brayton Lye, MD [facility]   To contact the attending provider between 7A-7P or the covering provider during after hours 7P-7A, please log into the web site [email or web address] and access using universal [place] password for that web site. If you do not have the password, please call the hospital operator.  06/25/2024, 1:03 PM

## 2024-06-25 NOTE — Plan of Care (Signed)
  Problem: Clinical Measurements: Goal: Ability to maintain clinical measurements within normal limits will improve Outcome: Progressing Goal: Will remain free from infection Outcome: Progressing   Problem: Activity: Goal: Risk for activity intolerance will decrease Outcome: Progressing   Problem: Safety: Goal: Ability to remain free from injury will improve Outcome: Progressing   

## 2024-06-25 NOTE — Discharge Instructions (Signed)
 You can reach the IR department patient care line at [phone] with questions and concerns. Expect a call from the office for PCN exchange in approximately 8 weeks. Keep follow up appts with urology. Make sure you take home a flush and cleaning swab. You do not need to flush PCN daily at home, but if you call reporting a concern, you may be asked to flush the drain by the provider on the line. Keep the flush and swab in a cool, dry place.  Change dressing every 2-3 days or sooner if soiled. Cover drain when showering and change dressing immediately after. Do not allow your bag to become more than 2/3 full. Measure output daily.

## 2024-06-25 NOTE — Progress Notes (Signed)
 Referring Physician(s): Willette Adriana LABOR, MD   Supervising Physician: Vanice Revel  Patient Status:  [facility] - In-pt  Chief Complaint:  Left hydronephrosis, acute renal failure, suspect left ureteral obstruction - s/p left percutaneous nephrostomy 8/23 by Dr. Johann  Subjective:  Pt resting in bed. Denies any complaint at this time including fever, chills, pain at insertion site. RN at bedside at time of exam, notes that drain flushes easily.   Allergies: Patient has no known allergies.  Medications: Prior to Admission medications   Medication Sig Start Date End Date Taking? Authorizing Provider  amLODipine  (NORVASC ) 5 MG tablet Take 5 mg by mouth every evening.    Yes [provider]  atorvastatin  (LIPITOR) 40 MG tablet Take 40 mg by mouth daily. 04/04/24  Yes [provider]  bisoprolol-hydrochlorothiazide (ZIAC) 10-6.25 MG tablet Take 1 tablet by mouth daily. 04/16/24  Yes [provider]  divalproex  (DEPAKOTE  ER) 500 MG 24 hr tablet Take 1,000 mg by mouth at bedtime. 04/24/24  Yes [provider]  meloxicam (MOBIC) 7.5 MG tablet Take 7.5 mg by mouth daily.   Yes [provider]  olmesartan (BENICAR) 40 MG tablet Take 40 mg by mouth daily.    [provider]     Vital Signs: BP (!) 145/65 (BP Location: Left Arm)   Pulse 60   Temp 97.7 F (36.5 C)   Resp (!) 21   Ht 5' 9 (1.753 m)   Wt 138 lb 3.7 oz (62.7 kg)   SpO2 92%   BMI 20.41 kg/m   Physical Exam Cardiovascular:     Rate and Rhythm: Normal rate.  Pulmonary:     Effort: Pulmonary effort is normal.  Genitourinary:    Comments: L PCN present, intact. Blood tinged urine in collection. Insertion unremarkable. Ext suture and stat lock in place. No blood clots in bag.  Skin:    General: Skin is warm and dry.  Neurological:     Mental Status: He is alert.     Imaging: IR NEPHROSTOMY PLACEMENT LEFT Result Date: 06/24/2024 CLINICAL DATA:  Moderate left  hydronephrosis. Marked right renal parenchymal atrophy. Elevated creatinine. EXAM: LEFT PERCUTANEOUS NEPHROSTOMY CATHETER PLACEMENT UNDER ULTRASOUND AND FLUOROSCOPIC GUIDANCE FLUOROSCOPY: Radiation Exposure Index (as provided by the fluoroscopic device): 2.3 mGy air Kerma TECHNIQUE: Leftflank region prepped with Betadine, draped in usual sterile fashion, infiltrated locally with 1% lidocaine . Intravenous Fentanyl  50mcg and Versed  1mg  were administered by RN during a total moderate (conscious) sedation time of 15 minutes; the patient's level of consciousness and physiological / cardiorespiratory status were monitored continuously by radiology RN under my direct supervision. Under real-time ultrasound guidance, a 21-gauge trocar needle was advanced into a posterior upper pole calyx. Ultrasound image documentation was saved. Urine spontaneously returned through the needle. Needle was exchanged over a guidewire for transitional dilator. Contrast injection confirmed appropriate positioning. Catheter was exchanged over a guidewire for a 10 French pigtail catheter, formed centrally within the left renal collecting system. Contrast injection confirms appropriate positioning and patency. Catheter secured externally with 0 Prolene suture and placed to external drain bag. COMPLICATIONS: COMPLICATIONS none IMPRESSION: 1. Technically successful left percutaneous nephrostomy catheter placement. Electronically Signed   By: JONETTA Johann M.D.   On: 06/24/2024 06:53   US  RENAL Result Date: 06/24/2024 CLINICAL DATA:  [id number] AKI (acute kidney injury) (HCC) [id number] EXAM: RENAL / URINARY TRACT ULTRASOUND COMPLETE COMPARISON:  CT angio chest and abdomen 06/23/2024 FINDINGS: Right Kidney: Renal measurements: 5.2 x 3 x 3.1  cm = volume: 25 mL. Echogenicity within normal limits. No mass or hydronephrosis visualized. Left Kidney: Renal measurements: 12 x 5.1 x 4.7 cm = volume: 152 mL. Nephrostomy tube noted within the left renal pelvis region.  Echogenicity within normal limits. No mass or hydronephrosis visualized. Urinary bladder: Appears normal for degree of bladder distention. Other: None. IMPRESSION: 1. Atrophic right kidney. 2.  Left nephrostomy tube in grossly appropriate position. Electronically Signed   By: Morgane  Naveau M.D.   On: 06/24/2024 00:18   CT Angio Chest/Abd/Pel for Dissection W and/or Wo Contrast Result Date: 06/23/2024 EXAM: CTA CHEST, ABDOMEN AND PELVIS WITH AND WITHOUT CONTRAST 06/23/2024 11:14:57 AM TECHNIQUE: CTA of the chest was performed with and without the administration of intravenous contrast. CTA of the abdomen and pelvis was performed with and without the administration of intravenous contrast. Multiplanar reformatted images are provided for review. MIP images are provided for review. Automated exposure control, iterative reconstruction, and/or weight based adjustment of the mA/kV was utilized to reduce the radiation dose to as low as reasonably achievable. COMPARISON: 05/02/2020 and previous. CLINICAL HISTORY: Acute aortic syndrome (AAS) suspected. AAs suspected; Omnipaque  350; 100 cc; c/o left side and LLQ pain. Pt states initial pain started over a week ago in both side and has just came down to the left side now primarily. Pt denies hx of kidney stones or injury. Pt denies complications with urination. Pt states hurts more when moving. FINDINGS: VASCULATURE: Scattered 3-vessel coronary calcifications. Mitral annulus calcifications. AORTA: No acute finding. Aortic atherosclerosis (ICD10-I70.0). No abdominal aortic aneurysm. No dissection. Patent infrarenal aortobifemoral graft. PULMONARY ARTERIES: No pulmonary embolism with the limits of this exam. GREAT VESSELS OF AORTIC ARCH: No acute finding. No dissection. No arterial occlusion or significant stenosis. CELIAC TRUNK: No acute finding. No occlusion or significant stenosis. SUPERIOR MESENTERIC ARTERY: Calcified plaque at the origin of the superior mesenteric  artery resulting in short segment stenosis of at least moderate severity, atheromatous but patent distally. INFERIOR MESENTERIC ARTERY: Proximal occlusion likely related to graft placement RENAL ARTERIES: Proximal occlusion of the right renal artery . 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 Heavily atheromatous native bilateral external and internal iliac arteries. CHEST: MEDIASTINUM: No mediastinal lymphadenopathy. The heart and pericardium demonstrate no acute abnormality. LUNGS AND PLEURA: Small left pleural effusion. Pulmonary emphysema. Chronic linear scarring/atelectasis posteriorly in both lower lobes. THORACIC BONES AND SOFT TISSUES: Chronic T9 vertebral compression deformity. NO ACUTE BONE OR SOFT TISSUE ABNORMALITY. ABDOMEN AND PELVIS: LIVER: The liver is unremarkable. GALLBLADDER AND BILE DUCTS: Gallbladder is unremarkable. No biliary ductal dilatation. SPLEEN: The spleen is unremarkable. PANCREAS: The pancreas is unremarkable. ADRENAL GLANDS: Bilateral adrenal glands demonstrate no acute abnormality. KIDNEYS, URETERS AND BLADDER: Marked right renal parenchymal atrophy, progressive since 01/16/2017. Moderate left hydronephrosis. No definite urolithiasis. The ureter appears nondistended. Urinary bladder decompressed. GI AND BOWEL: Stomach and duodenal sweep demonstrate no acute abnormality. There is no bowel obstruction. No abnormal bowel wall thickening or distension. REPRODUCTIVE: Reproductive organs are unremarkable. PERITONEUM AND RETROPERITONEUM: Small volume abdominal and pelvic ascites. No free air. LYMPH NODES: No lymphadenopathy. ABDOMINAL BONES AND SOFT TISSUES: No acute soft tissue abnormality. Multilevel lumbar spondylotic change most marked L3-S1. IMPRESSION: 1. No evidence of acute aortic syndrome. 2. Marked right renal parenchymal atrophy, progressive since 12/20/19. 3. Moderate left hydronephrosis without definite urolithiasis. The ureter appears nondistended.  4. Small left pleural effusion. Electronically signed by: Katheleen Faes MD 06/23/2024 12:12 PM EDT RP Workstation: [id number]    Labs:  CBC: Recent Labs    06/23/24 1042 06/24/24 0611 06/25/24 0601  WBC 7.8 5.1 5.2  HGB 12.7* 9.2* 8.2*  HCT 37.2* 27.0* 24.6*  PLT 225 169 164    COAGS: Recent Labs    06/23/24 1041 06/24/24 0611  INR 1.0 1.1  APTT  --  28    BMP: Recent Labs    06/23/24 1042 06/24/24 0611 06/25/24 0601  NA 131* 136 135  K 4.0 3.7 3.3*  CL 91* 104 103  CO2 23 22 20*  GLUCOSE 171* 112* 94  BUN 67* 57* 49*  CALCIUM  9.0 8.2* 8.1*  CREATININE 9.37* 7.13* 5.52*  GFRNONAA 5* 7* 10*    LIVER FUNCTION TESTS: Recent Labs    06/23/24 1042 06/24/24 0611 06/25/24 0601  BILITOT 1.1 0.6 0.6  AST 15 10* 12*  ALT 6 <5 5  ALKPHOS 55 37* 33*  PROT 8.3* 6.0* 5.7*  ALBUMIN  3.8 2.6* 2.4*    Assessment and Plan:  S/p L PCN by Dr. Merrie on 8/23 for L hydronephrosis, suspected obstructive process.  Creatinine has been improving since PCN. Creat 5.52 today from 9.37 prior to PCN. WBC remain WNL. Hgb has decreased but is suspected dilutional.  L PCN present, intact. Blood tinged urine in collection. Insertion unremarkable. Ext suture and stat lock in place. No blood clots in bag.  No concerns with PCN at this time. Expected to have some blood present in collection in first 72 hr after procedure.  Patient will follow outpatient with urology for management. IR will plan to exchange PCN approx every 8 weeks while PCN in place. Outpatient orders placed for IR f/u. Patient given a flush to take home in event that he is asked to flush the PCN at home.   Electronically Signed: Laymon Coast, NP 06/25/2024, 2:39 PM   I spent a total of 15 Minutes at the the patient's bedside AND on the patient's hospital floor or unit, greater than 50% of which was counseling/coordinating care for s/p L PCN 8/23.

## 2024-06-25 NOTE — TOC CM/SW Note (Signed)
 Transition of Care [facility]) - Inpatient Brief Assessment   Patient Details  Name: [name] [name] MRN: [id number] Date of Birth: 30-Oct-1951  Transition of Care [facility]) CM/SW Contact:    Tom-Johnson, Harvest Muskrat, RN Phone Number: 06/25/2024, 4:12 PM   Clinical Narrative:  Patient presented to the ED with Lt side and LLQ pain. Admitted with Acute Renal Failure. CTA showed moderate Lt Hydronephrosis as well as mild Ascites and a decompressed Bladder. IR consulted, underwent Lt Percutaneous Nephrostomy on 06/23/24. Urology also following.   CM spoke with patient at bedside about discharge disposition needs. From home alone, does not have children. Has four supportive children. Independent and states he drives self. States he does not have DME's at home.  PCP is [name], NP and uses [facility] on [address] in [place].  Home health recommended, patient states he has no preference. CM called in referral to [facility] and Amy voiced acceptance, info on AVS.   Patient not Medically ready for discharge.  CM will continue to follow as patient progresses with care towards discharge.             Transition of Care Asessment: Insurance and Status: Insurance coverage has been reviewed Patient has primary care physician: Yes Home environment has been reviewed: Yes Prior level of function:: Modified Independent Prior/Current Home Services: No current home services Social Drivers of Health Review: SDOH reviewed no interventions necessary Readmission risk has been reviewed: Yes Transition of care needs: transition of care needs identified, TOC will continue to follow

## 2024-06-25 NOTE — Evaluation (Signed)
 Occupational Therapy Evaluation Patient Details Name: [name] [name] MRN: [id number] DOB: 1951/07/11 Today's Date: 06/25/2024   History of Present Illness   72 y.o. male presents to [facility] 06/23/24 with L flank/back pain and abdominal pain. Pt with L hydronephrosis w/ acute renal failure. 8/23 L perc nephrostomy placement. PMHx: HTN, HLD, seizures, AAA,  vitamin D  deficiency, COPD, PAD     Clinical Impressions Pt was living with his 2 [place]pherds prior to admission, functioning independently and driving. Presents with generalized weakness and impaired cognition with very poor hearing. Pt repeating himself multiple times during session. He is struggling to keep up with medical information. Pt reports having difficulty managing medications at home. Overall, he is mobilizing and completing ADLs with set up to supervision for safety and telemetry. Will follow to formally assess cognition and address ADLs. Pt reports his tub at home is very unsafe, unable to elaborate. Requested HHRN to MD and CM and recommend HHOT.     If plan is discharge home, recommend the following:   Direct supervision/assist for medications management;Assistance with cooking/housework     Functional Status Assessment   Patient has had a recent decline in their functional status and demonstrates the ability to make significant improvements in function in a reasonable and predictable amount of time.     Equipment Recommendations   None recommended by OT     Recommendations for Other Services         Precautions/Restrictions   Precautions Precautions: Fall Recall of Precautions/Restrictions: Impaired Precaution/Restrictions Comments: L nephrostomy tube Restrictions Weight Bearing Restrictions Per Provider Order: No     Mobility Bed Mobility Overal bed mobility: Modified Independent             General bed mobility comments: HOB up slightly, use of rail    Transfers Overall transfer  level: Needs assistance Equipment used: None Transfers: Sit to/from Stand Sit to Stand: Supervision           General transfer comment: Supervision for safety and lines. Cues to stand momentarily prior to ambulation.      Balance Overall balance assessment: Mild deficits observed, not formally tested                                         ADL either performed or assessed with clinical judgement   ADL Overall ADL's : Needs assistance/impaired Eating/Feeding: Independent;Sitting;Bed level   Grooming: Supervision/safety;Standing   Upper Body Bathing: Set up;Sitting   Lower Body Bathing: Supervison/ safety;Sit to/from stand   Upper Body Dressing : Set up;Sitting   Lower Body Dressing: Set up;Sitting/lateral leans   Toilet Transfer: Supervision/safety;Ambulation   Toileting- Clothing Manipulation and Hygiene: Supervision/safety       Functional mobility during ADLs: Supervision/safety General ADL Comments: Pt frequently repeating himself. Very HOH.     Vision Baseline Vision/History: 1 Wears glasses Ability to See in Adequate Light: 0 Adequate Patient Visual Report: No change from baseline Additional Comments: closes R eye frequently, reports no vision changes     Perception         Praxis         Pertinent Vitals/Pain Pain Assessment Pain Assessment: No/denies pain     Extremity/Trunk Assessment Upper Extremity Assessment Upper Extremity Assessment: Overall WFL for tasks assessed   Lower Extremity Assessment Lower Extremity Assessment: Defer to PT evaluation   Cervical / Trunk Assessment Cervical / Trunk Assessment:  Normal   Communication Communication Communication: Impaired Factors Affecting Communication: Hearing impaired   Cognition Arousal: Alert Behavior During Therapy: WFL for tasks assessed/performed Cognition: Cognition impaired   Orientation impairments: Situation Awareness: Intellectual awareness impaired, Online  awareness impaired Memory impairment (select all impairments): Short-term memory   Executive functioning impairment (select all impairments): Problem solving OT - Cognition Comments: Pt with poor medical literacy. Reports being unaware of what his home medication are for and taking old meds when looking for relief from back pain prior to admission.                 Following commands: Intact       Cueing  General Comments   Cueing Techniques: Verbal cues      Exercises     Shoulder Instructions      Home Living Family/patient expects to be discharged to:: Private residence Living Arrangements: Alone Available Help at Discharge: Friend(s);Available PRN/intermittently Type of Home: House Home Access: Stairs to enter [employer] of Steps: 4 Entrance Stairs-Rails: Right;Left Home Layout: One level     Bathroom Shower/Tub: Chief Strategy Officer: Standard     Home Equipment: None          Prior Functioning/Environment Prior Level of Function : Independent/Modified Independent;Driving               ADLs Comments: reports his tub/shower is not safe, unable to describe in what way    OT Problem List: Impaired balance (sitting and/or standing);Decreased cognition;Decreased safety awareness   OT Treatment/Interventions: Self-care/ADL training;Cognitive remediation/compensation;Therapeutic activities;Patient/family education      OT Goals(Current goals can be found in the care plan section)   Acute Rehab OT Goals OT Goal Formulation: With patient Time For Goal Achievement: 07/09/24 Potential to Achieve Goals: Good ADL Goals Additional ADL Goal #1: Pt will complete basic ADLs independently. Additional ADL Goal #2: Pt will participate in cognitive screening tool. Additional ADL Goal #3: Pt will manage nephrostomy tube during ADLs and mobility.   OT Frequency:  Min 2X/week    Co-evaluation              AM-PAC OT 6  Clicks Daily Activity     Outcome Measure Help from another person eating meals?: None Help from another person taking care of personal grooming?: A Little Help from another person toileting, which includes using toliet, bedpan, or urinal?: A Little Help from another person bathing (including washing, rinsing, drying)?: A Little Help from another person to put on and taking off regular upper body clothing?: None Help from another person to put on and taking off regular lower body clothing?: None 6 Click Score: 21   End of Session Equipment Utilized During Treatment: Gait belt Nurse Communication: Other (comment) (cognition, need for [facility])  Activity Tolerance: Patient tolerated treatment well Patient left: in bed;with call bell/phone within reach;with bed alarm set  OT Visit Diagnosis: Unsteadiness on feet (R26.81);Other symptoms and signs involving cognitive function                Time: 8855-8778 OT Time Calculation (min): 37 min Charges:  OT General Charges $OT Visit: 1 Visit OT Evaluation $OT Eval Low Complexity: 1 Low OT Treatments $Self Care/Home Management : 8-22 mins  Mliss HERO, OTR/L Acute Rehabilitation Services Office: [phone]   Kennth Mliss Helling 06/25/2024, 1:04 PM

## 2024-06-25 NOTE — Progress Notes (Signed)
 [facility] Progress Note  Subjective:  1.4L UOP in 24h. No events reported overnight.  Seen initially sleeping in bed. No uremic symptoms  Vitals:   06/24/24 2109 06/25/24 0016 06/25/24 0412 06/25/24 0500  BP: 136/64 129/61 (!) 126/58   Pulse: 68 60 62   Resp:  17 14   Temp:  98.2 F (36.8 C) 97.9 F (36.6 C)   TempSrc:  Oral Oral   SpO2:  93% 93%   Weight:    62.7 kg  Height:        Exam: Gen alert, no distress No jvd or bruits Chest BLBS RRR  Abd soft ntnd no mass or ascites +bs Ext no LE or UE edema Neuro is alert    L nephrostomy tube   Home bp meds: Norvasc  5 hs Bisoprolol- hydrochlorothiazide 10-6.25 every day Lisinopril  40mg  at bedtime   Date                             Creat               eGFR (ml/min) 2012- dec 2018           0.60- 0.94         03/03/21                        1.09                 73 ml/min 06/23/24                        9.37                 5 ml/min   UA - small Hb, prot neg, 0-5 rbc/wbc UNa 85, UCr 47 CTA today: R kidney atrophic (was normal size on the 2018 CTA study), L kidney moderate hydronephrosis, no definite urolithiasis, ureter appears non-distended and urinary bladder decompressed. Renal US  8/23 post PCN -> R 5.2 cm / L 12 cm w/ nephrostomy tube in place, no hydronephrosis either side.    Assessment/ Plan: Renal failure: last creat was 1.09 in may 2022, eGFR 74 ml/min. Admit creatinine was 9.3 in the setting of 1 week of L flank pain. CT showed sig L sided hydronephrosis and atrophic R kidney. UA was negative. Renal US  done post-PCN showed 12cm L kidney w/ pcn tube in proper position, no hydro; also R kidney atrophic at 5 cm, not likely functional at this size.  AKI likely due to L-sided obstruction. May also have some CKD atrophic R kidney but not possible to say how significant at this point. IR placed L PCN 8/23 w/ good UOP overnight thru the drain. Creatinine down to 5.5 today. No uremic symptoms. Urology is also  following. F/u labs in am, cont renal diet for now and cont IVF's 75 cc/hr. encourage p.o. intake HTN: Improved. BP 230/ 98 on presentation. Home ACEi and hydrochlorothiazide on hold appropriately. Getting norvasc  + po labetalol  here. Bp have normalized.  COPD: not on any O2   Dr. Evalene Lanes  [facility] 06/25/2024, 8:00 AM  Recent Labs  Lab 06/23/24 1041 06/23/24 1042 06/24/24 0611 06/25/24 0601  HGB  --    < > 9.2* 8.2*  ALBUMIN   --    < > 2.6* 2.4*  CALCIUM   --    < > 8.2* 8.1*  PHOS 6.1*  --   --   --  CREATININE  --    < > 7.13* 5.52*  K  --    < > 3.7 3.3*   < > = values in this interval not displayed.   No results for input(s): IRON, TIBC, FERRITIN in the last 168 hours. Inpatient medications:  amLODipine   5 mg Oral QPM   atorvastatin   40 mg Oral Daily   divalproex   1,000 mg Oral QHS   heparin   5,000 Units Subcutaneous Q8H   labetalol   100 mg Oral BID   magnesium  oxide  200 mg Oral Daily   potassium chloride   40 mEq Oral Q6H   sodium chloride  flush  3 mL Intravenous Q12H   sodium chloride  flush  3 mL Intravenous Q12H   sodium chloride  flush  5 mL Intracatheter Q8H     acetaminophen  **OR** acetaminophen , bisacodyl , hydrALAZINE , HYDROmorphone  (DILAUDID ) injection, ipratropium, labetalol , levalbuterol , ondansetron  **OR** ondansetron  (ZOFRAN ) IV, oxyCODONE , senna-docusate, sodium phosphate , traZODone 

## 2024-06-26 ENCOUNTER — Inpatient Hospital Stay (HOSPITAL_COMMUNITY)

## 2024-06-26 DIAGNOSIS — N179 Acute kidney failure, unspecified: Secondary | ICD-10-CM | POA: Diagnosis not present

## 2024-06-26 LAB — CBC
HCT: 22.6 % — ABNORMAL LOW (ref 39.0–52.0)
Hemoglobin: 7.6 g/dL — ABNORMAL LOW (ref 13.0–17.0)
MCH: 31.9 pg (ref 26.0–34.0)
MCHC: 33.6 g/dL (ref 30.0–36.0)
MCV: 95 fL (ref 80.0–100.0)
Platelets: 170 K/uL (ref 150–400)
RBC: 2.38 MIL/uL — ABNORMAL LOW (ref 4.22–5.81)
RDW: 13.6 % (ref 11.5–15.5)
WBC: 4.9 K/uL (ref 4.0–10.5)
nRBC: 0 % (ref 0.0–0.2)

## 2024-06-26 LAB — COMPREHENSIVE METABOLIC PANEL WITH GFR
ALT: <5 U/L (ref 0–44)
AST: 12 U/L — ABNORMAL LOW (ref 15–41)
Albumin: 2.4 g/dL — ABNORMAL LOW (ref 3.5–5.0)
Alkaline Phosphatase: 34 U/L — ABNORMAL LOW (ref 38–126)
Anion gap: 10 (ref 5–15)
BUN: 37 mg/dL — ABNORMAL HIGH (ref 8–23)
CO2: 21 mmol/L — ABNORMAL LOW (ref 22–32)
Calcium: 8.3 mg/dL — ABNORMAL LOW (ref 8.9–10.3)
Chloride: 105 mmol/L (ref 98–111)
Creatinine, Ser: 3.91 mg/dL — ABNORMAL HIGH (ref 0.61–1.24)
GFR, Estimated: 15 mL/min — ABNORMAL LOW (ref >60)
Glucose, Bld: 105 mg/dL — ABNORMAL HIGH (ref 70–99)
Potassium: 3.6 mmol/L (ref 3.5–5.1)
Sodium: 136 mmol/L (ref 135–145)
Total Bilirubin: 0.6 mg/dL (ref 0.0–1.2)
Total Protein: 5.7 g/dL — ABNORMAL LOW (ref 6.5–8.1)

## 2024-06-26 LAB — GLUCOSE, CAPILLARY: Glucose-Capillary: 97 mg/dL (ref 70–99)

## 2024-06-26 NOTE — Plan of Care (Signed)

## 2024-06-26 NOTE — Progress Notes (Addendum)
 PROGRESS NOTE    [name] [name]  FMW:[id number] DOB: [birth year] DOA: 06/23/2024 PCP: [name], NP    Chief Complaint  Patient presents with   Flank Pain    Brief Narrative:   [name] [name] is a 73 year old Male with past medical history of HTN, HLD, seizures, AAA,  vitamin D  deficiency, history of rectal prolapse with correction in 2018... presenting to ED with complaint of left flank/back pain, abdominal pain.  Progressively worsening  pain over past week.  His workup significant for AKI, creatinine up to 9.37, CT angio chest abdomen pelvis significant for left hydronephrosis, left renal parenchymal atrophy, patient was transferred to [facility] for left nephrostomy tube insertion.  Was done by IR on admission, creatinine continue to improve.   Assessment & Plan:   Principal Problem:   Acute renal failure (ARF) (HCC) Active Problems:   Accelerated hypertension   Hydronephrosis, left   Hyponatremia   History of hepatitis C   PAD (peripheral artery disease) (HCC)   AAA (abdominal aortic aneurysm) (HCC)   COPD without exacerbation (HCC)   Hypertension   Seizures (HCC)   Renal insufficiency, ??  AKI  Left hydronephrosis Chronic right renal atrophy - Unclear what is his recent baseline, most recent 1.09 in May 2022, reports he is following with [facility] clinic. - Continue with IV fluids at 75 cc/h as per renal recommendations- Continue to monitor labs. - Avoid nephrotoxic medications. - Urology input greatly appreciated, status post left percutaneous nephrostomy tube insertion by IR 8/23 with good urine output.  Will likely need antegrade nephrostogram to evaluate ureters (this will be further discussed by the patient as an outpatient per urology, they currently has signed off) - Avoid nephrotoxic medications - Creatinine 9.3 on admission, renal function improving, it is 3.9 today.  Anemia Low B12 Low folic acid  -Hemoglobin trending down, 5.7 on admission,  this morning is 7.6, there is no evidence of active or significant blood loss, . - Very likely it is delutional given significant amount of IV fluid he received , but unclear what his hemoglobin baseline (I have reached a [facility]  to receive his records regarding his creatinine and hemoglobin, still awaiting callback) . - I will obtain CT abdomen pelvis without contrast to rule out any significant hematoma or source of blood loss in the abdomen . - She is agreeable to receive PRBC transfusion if indicated  anemia panel has been obtained, workup significant for low B12 and folic acid , started on supplement  Accelerated hypertension -Blood pressure 230/98 on presentation - Continue as needed IV hydralazine , labetalol  - Medication of Norvasc  to be continued increased from 5 to 10 mg daily -Will add labetalol  200 mg p.o. to twice daily - We are holding home medication of lisinopril , HCTZ,  -Blood pressure on the lower side, decrease both Norvasc  and labetalol , as well holding parameters, renal input greatly appreciated     Hyponatremia Resolved   COPD without exacerbation (HCC) - No signs of exacerbation -As needed oxygen supplement, DuoNeb bronchodilator treatments   AAA (abdominal aortic aneurysm) (HCC) - CTA reviewed no signs of dissection  No abdominal aortic aneurysm. No dissection. Patent infrarenal aortobifemoral graft.     PAD (peripheral artery disease) (HCC) Not on any antiplatelet treatment including aspirin  Will continue calcium  channel blockers, statins   History of hepatitis C LFTs within normal limits, will monitor closely   Seizures (HCC) - No known recent history of seizure episodes, continue home medication of Depakote   DVT prophylaxis: (Heparin ) Code Status: (Full) Family Communication: (none at bedside) Disposition:   Status is: Inpatient    Consultants:  Renal IR urology   Subjective:  He denies any complaints today, no  significant events as discussed with staff  Objective: Vitals:   06/26/24 0000 06/26/24 0400 06/26/24 0751 06/26/24 1200  BP: 123/67 (!) 142/68 (!) 141/79 (!) 149/67  Pulse: 63 67 68 67  Resp: 17 16 18  (!) 21  Temp: 97.8 F (36.6 C) 97.9 F (36.6 C) 98 F (36.7 C) 97.9 F (36.6 C)  TempSrc: Oral Oral Oral Oral  SpO2: 92% 94% 96% 97%  Weight:      Height:        Intake/Output Summary (Last 24 hours) at 06/26/2024 1435 Last data filed at 06/26/2024 1000 Gross per 24 hour  Intake 684.35 ml  Output 1850 ml  Net -1165.65 ml   Filed Weights   06/24/24 0413 06/24/24 0500 06/25/24 0500  Weight: 60.7 kg 60.7 kg 62.7 kg    Examination:   Awake Alert, Oriented X 3, frail, deconditioned, in no apparent distress, hard of hearing Symmetrical Chest wall movement, Good air movement bilaterally, CTAB RRR,No Gallops,Rubs or new Murmurs, No Parasternal Heave +ve B.Sounds, Abd Soft, left nephrostomy tube present with good output. No Cyanosis, Clubbing or edema, No new Rash or bruise      Data Reviewed: I have personally reviewed following labs and imaging studies  CBC: Recent Labs  Lab 06/23/24 1042 06/24/24 0611 06/25/24 0601 06/26/24 0528  WBC 7.8 5.1 5.2 4.9  NEUTROABS 5.2  --   --   --   HGB 12.7* 9.2* 8.2* 7.6*  HCT 37.2* 27.0* 24.6* 22.6*  MCV 95.6 95.7 96.1 95.0  PLT 225 169 164 170    Basic Metabolic Panel: Recent Labs  Lab 06/23/24 1041 06/23/24 1042 06/24/24 0611 06/25/24 0601 06/26/24 0528  NA  --  131* 136 135 136  K  --  4.0 3.7 3.3* 3.6  CL  --  91* 104 103 105  CO2  --  23 22 20* 21*  GLUCOSE  --  171* 112* 94 105*  BUN  --  67* 57* 49* 37*  CREATININE  --  9.37* 7.13* 5.52* 3.91*  CALCIUM   --  9.0 8.2* 8.1* 8.3*  MG 1.9  --   --  1.4*  --   PHOS 6.1*  --   --   --   --     GFR: Estimated Creatinine Clearance: 14.5 mL/min (A) (by C-G formula based on SCr of 3.91 mg/dL (H)).  Liver Function Tests: Recent Labs  Lab 06/23/24 1042  06/24/24 0611 06/25/24 0601 06/26/24 0528  AST 15 10* 12* 12*  ALT 6 <5 5 <5  ALKPHOS 55 37* 33* 34*  BILITOT 1.1 0.6 0.6 0.6  PROT 8.3* 6.0* 5.7* 5.7*  ALBUMIN  3.8 2.6* 2.4* 2.4*    CBG: Recent Labs  Lab 06/25/24 0527 06/25/24 0902 06/26/24 0750  GLUCAP 98 93 97     Recent Results (from the past 240 hours)  Aerobic/Anaerobic Culture w Gram Stain (surgical/deep wound)     Status: None (Preliminary result)   Collection Time: 06/23/24  6:19 PM   Specimen: Fluid; Urine  Result Value Ref Range Status   Specimen Description FLUID  Final   Special Requests DRAIN, URINE  Final   Gram Stain NO WBC SEEN NO ORGANISMS SEEN   Final   Culture   Final    NO GROWTH 3  DAYS NO ANAEROBES ISOLATED; CULTURE IN PROGRESS FOR 5 DAYS Performed at [facility], [address], [place], [place] [address]    Report Status PENDING  Incomplete         Radiology Studies: No results found.       Scheduled Meds:  amLODipine   5 mg Oral QPM   atorvastatin   40 mg Oral Daily   cyanocobalamin   1,000 mcg Subcutaneous Daily   divalproex   1,000 mg Oral QHS   folic acid   1 mg Oral Daily   heparin   5,000 Units Subcutaneous Q8H   labetalol   100 mg Oral BID   magnesium  oxide  200 mg Oral Daily   nicotine   21 mg Transdermal Daily   sodium chloride  flush  3 mL Intravenous Q12H   sodium chloride  flush  3 mL Intravenous Q12H   sodium chloride  flush  5 mL Intracatheter Q8H   Continuous Infusions:  sodium chloride  75 mL/hr at 06/26/24 0523     LOS: 3 days      Brayton Lye, MD [facility]   To contact the attending provider between 7A-7P or the covering provider during after hours 7P-7A, please log into the web site [email or web address] and access using universal [place] password for that web site. If you do not have the password, please call the hospital operator.  06/26/2024, 2:35 PM

## 2024-06-26 NOTE — Progress Notes (Signed)
  Subjective: Patient doing well overall calm sleeping in bed.  Nephrostomy tube draining well.  Objective: Vital signs in last 24 hours: Temp:  [97.7 F (36.5 C)-98 F (36.7 C)] 98 F (36.7 C) (08/26 0751) Pulse Rate:  [60-69] 68 (08/26 0751) Resp:  [16-21] 18 (08/26 0751) BP: (123-146)/(58-79) 141/79 (08/26 0751) SpO2:  [92 %-97 %] 96 % (08/26 0751)  Intake/Output from previous day: 08/25 0701 - 08/26 0700 In: 924.4 [P.O.:720; I.V.:149.4; IV Piggyback:50] Out: 1800 [Urine:1800] Intake/Output this shift: No intake/output data recorded.  Physical Exam:  General: Alert and oriented CV: RRR Lungs: Clear Abdomen: Soft, ND, nontender GU: No Foley in place, left nephrostomy tube draining slightly blood-tinged urine  Lab Results: Recent Labs    06/24/24 0611 06/25/24 0601 06/26/24 0528  HGB 9.2* 8.2* 7.6*  HCT 27.0* 24.6* 22.6*   BMET Recent Labs    06/25/24 0601 06/26/24 0528  NA 135 136  K 3.3* 3.6  CL 103 105  CO2 20* 21*  GLUCOSE 94 105*  BUN 49* 37*  CREATININE 5.52* 3.91*  CALCIUM  8.1* 8.3*     Studies/Results: No results found.  Assessment/Plan: 73 year old male with a history of vascular disease and aortofemoral bypass presented to the ER in renal failure, atrophic right kidney and left severe hydronephrosis.  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   # Left hydronephrosis - Nephrostomy tube in place working well - Continue nephrostomy tube throughout duration patient will need to be discharged with nephrostomy tube - Will set up outpatient follow-up for discussion of management-outpatient follow-up has been requested   # Renal failure - Creatinine decreasing appropriately with nephrostomy tube decompression Recommend continuing nephrostomy tube at discharge-    # Atrophic right kidney - No hydronephrosis does not appear that drainage is necessary for the right side.  Urology will sign off and  follow peripherally   LOS: 3 days   Jackey Pea MD 06/26/2024, 8:01 AM [facility]

## 2024-06-26 NOTE — Care Management Important Message (Signed)
 Important Message  Patient Details  Name: [name] [name] MRN: [id number] Date of Birth: 1951/09/10   Important Message Given:  Yes - Medicare IM     Claretta Deed 06/26/2024, 4:07 PM

## 2024-06-26 NOTE — Progress Notes (Signed)
 PT Cancellation Note  Patient Details Name: [name] [name] MRN: [id number] DOB: Jul 09, 1951   Cancelled Treatment:    Reason Eval/Treat Not Completed: Patient declined, no reason specified. Pt declines treatment twice on this date. Both attempts pt reports feeling unwell. He reports he needs to get home soon however he does not know if he will be able to because he has been told he only has one functioning kidney. PT encourages the pt to continue mobilizing with staff assistance to avoid deconditioning. PT will follow up as time allows.   Bernardino JINNY Ruth 06/26/2024, 2:46 PM

## 2024-06-26 NOTE — Progress Notes (Signed)
 [name] [name]  Subjective:  1.8L UOP in 24h. Progress reported overnight.  Seen in bed eating breakfast.  Overall doing well. No uremic symptoms  Vitals:   06/25/24 2206 06/26/24 0000 06/26/24 0400 06/26/24 0751  BP: (!) 146/69 123/67 (!) 142/68 (!) 141/79  Pulse: 69 63 67 68  Resp:  17 16 18   Temp:  97.8 F (36.6 C) 97.9 F (36.6 C) 98 F (36.7 C)  TempSrc:  Oral Oral Oral  SpO2:  92% 94% 96%  Weight:      Height:        Exam: Gen alert, no distress No jvd or bruits Chest BLBS RRR  Abd soft ntnd no mass or ascites +bs Ext no LE or UE edema Neuro is alert    L nephrostomy tube   Home bp meds: Norvasc  5 hs Bisoprolol- hydrochlorothiazide 10-6.25 every day Lisinopril  40mg  at bedtime   Date                             Creat               eGFR (ml/min) 2012- dec 2018           0.60- 0.94         03/03/21                        1.09                 73 ml/min 06/23/24                        9.37                 5 ml/min   UA - small Hb, prot neg, 0-5 rbc/wbc UNa 85, UCr 47 CTA today: R kidney atrophic (was normal size on the 2018 CTA study), L kidney moderate hydronephrosis, no definite urolithiasis, ureter appears non-distended and urinary bladder decompressed. Renal US  8/23 post PCN -> R 5.2 cm / L 12 cm w/ nephrostomy tube in place, no hydronephrosis either side.    Assessment/ Plan: Renal failure: last creat was 1.09 in may 2022, eGFR 74 ml/min. Admit creatinine was 9.3 in the setting of 1 week of L flank pain. CT showed sig L sided hydronephrosis and atrophic R kidney. UA was negative. Renal US  done post-PCN showed 12cm L kidney w/ pcn tube in proper position, no hydro; also R kidney atrophic at 5 cm, not likely functional at this size.  AKI likely due to L-sided obstruction. May also have some CKD atrophic R kidney but not possible to say how significant at this point. IR placed L PCN 8/23 w/ good UOP overnight thru the drain. Creatinine down  to 3.9 today. No uremic symptoms. Urology is also following. F/u labs in am, cont renal diet for now  Encourage p.o. intake instead of IVF.  Will need hospital follow-up at discharge HTN: Improved. BP 230/ 98 on presentation. Home ACEi and hydrochlorothiazide on hold appropriately. Getting norvasc  + po labetalol  here. Bp have normalized.  COPD: not on any O2   Dr. Evalene Lanes  [facility] 06/26/2024, 8:25 AM  Recent Labs  Lab 06/23/24 1041 06/23/24 1042 06/25/24 0601 06/26/24 0528  HGB  --    < > 8.2* 7.6*  ALBUMIN   --    < > 2.4* 2.4*  CALCIUM   --    < >  8.1* 8.3*  PHOS 6.1*  --   --   --   CREATININE  --    < > 5.52* 3.91*  K  --    < > 3.3* 3.6   < > = values in this interval not displayed.   Recent Labs  Lab 06/25/24 1041  IRON 63  TIBC 218*  FERRITIN 249   Inpatient medications:  amLODipine   5 mg Oral QPM   atorvastatin   40 mg Oral Daily   cyanocobalamin   1,000 mcg Subcutaneous Daily   divalproex   1,000 mg Oral QHS   folic acid   1 mg Oral Daily   heparin   5,000 Units Subcutaneous Q8H   labetalol   100 mg Oral BID   magnesium  oxide  200 mg Oral Daily   nicotine   21 mg Transdermal Daily   sodium chloride  flush  3 mL Intravenous Q12H   sodium chloride  flush  3 mL Intravenous Q12H   sodium chloride  flush  5 mL Intracatheter Q8H    sodium chloride  75 mL/hr at 06/26/24 0523    acetaminophen  **OR** acetaminophen , bisacodyl , hydrALAZINE , HYDROmorphone  (DILAUDID ) injection, ipratropium, labetalol , levalbuterol , ondansetron  **OR** ondansetron  (ZOFRAN ) IV, oxyCODONE , senna-docusate, sodium phosphate , traZODone 

## 2024-06-26 NOTE — Plan of Care (Signed)
  Problem: Education: Goal: Knowledge of General Education information will improve Description: Including pain rating scale, medication(s)/side effects and non-pharmacologic comfort measures Outcome: Progressing   Problem: Health Behavior/Discharge Planning: Goal: Ability to manage health-related needs will improve Outcome: Progressing   Problem: Clinical Measurements: Goal: Ability to maintain clinical measurements within normal limits will improve Outcome: Progressing Goal: Will remain free from infection Outcome: Progressing   Problem: Activity: Goal: Risk for activity intolerance will decrease Outcome: Progressing   Problem: Nutrition: Goal: Adequate nutrition will be maintained Outcome: Progressing   Problem: Coping: Goal: Level of anxiety will decrease Outcome: Progressing   Problem: Pain Managment: Goal: General experience of comfort will improve and/or be controlled Outcome: Progressing   Problem: Safety: Goal: Ability to remain free from injury will improve Outcome: Progressing

## 2024-06-26 NOTE — Progress Notes (Signed)
 Pt is impulsive, very anxious and agitated. Keep on shouting I can't take this anymore. When asked, he said he want to go home right now. Explained the reason why he's here in the hospital, he calmed down for a while but after few minutes I left his room, I could hear him shouting again. I explained to him one more time on the benefits of being in the hospital for his current condition. Gave PRN sleeping pill. Will continue to monitor.

## 2024-06-27 DIAGNOSIS — D649 Anemia, unspecified: Secondary | ICD-10-CM | POA: Diagnosis not present

## 2024-06-27 DIAGNOSIS — N1330 Unspecified hydronephrosis: Secondary | ICD-10-CM | POA: Diagnosis not present

## 2024-06-27 DIAGNOSIS — N179 Acute kidney failure, unspecified: Secondary | ICD-10-CM | POA: Diagnosis not present

## 2024-06-27 DIAGNOSIS — I159 Secondary hypertension, unspecified: Secondary | ICD-10-CM

## 2024-06-27 LAB — RENAL FUNCTION PANEL
Albumin: 2.5 g/dL — ABNORMAL LOW (ref 3.5–5.0)
Anion gap: 6 (ref 5–15)
BUN: 31 mg/dL — ABNORMAL HIGH (ref 8–23)
CO2: 22 mmol/L (ref 22–32)
Calcium: 8.4 mg/dL — ABNORMAL LOW (ref 8.9–10.3)
Chloride: 108 mmol/L (ref 98–111)
Creatinine, Ser: 3.25 mg/dL — ABNORMAL HIGH (ref 0.61–1.24)
GFR, Estimated: 19 mL/min — ABNORMAL LOW (ref >60)
Glucose, Bld: 102 mg/dL — ABNORMAL HIGH (ref 70–99)
Phosphorus: 3.1 mg/dL (ref 2.5–4.6)
Potassium: 3.6 mmol/L (ref 3.5–5.1)
Sodium: 136 mmol/L (ref 135–145)

## 2024-06-27 LAB — CBC
HCT: 25.2 % — ABNORMAL LOW (ref 39.0–52.0)
Hemoglobin: 8.4 g/dL — ABNORMAL LOW (ref 13.0–17.0)
MCH: 31.9 pg (ref 26.0–34.0)
MCHC: 33.3 g/dL (ref 30.0–36.0)
MCV: 95.8 fL (ref 80.0–100.0)
Platelets: 167 K/uL (ref 150–400)
RBC: 2.63 MIL/uL — ABNORMAL LOW (ref 4.22–5.81)
RDW: 13.5 % (ref 11.5–15.5)
WBC: 5.2 K/uL (ref 4.0–10.5)
nRBC: 0 % (ref 0.0–0.2)

## 2024-06-27 LAB — GLUCOSE, CAPILLARY: Glucose-Capillary: 101 mg/dL — ABNORMAL HIGH (ref 70–99)

## 2024-06-27 MED ORDER — FOLIC ACID 1 MG PO TABS
2.0000 mg | ORAL_TABLET | Freq: Every day | ORAL | Status: DC
  Administered 2024-06-29: 2 mg via ORAL
  Filled 2024-06-27 (×2): qty 2

## 2024-06-27 NOTE — Plan of Care (Signed)

## 2024-06-27 NOTE — Progress Notes (Signed)
 Occupational Therapy Treatment Patient Details Name: [name] [name] MRN: [id number] DOB: [birth year] Today's Date: 06/27/2024   History of present illness 73 y.o. male presents to [facility] 06/23/24 with L flank/back pain and abdominal pain. Pt with L hydronephrosis w/ acute renal failure. 8/23 L perc nephrostomy placement. PMHx: HTN, HLD, seizures, AAA,  vitamin D  deficiency, COPD, PAD   OT comments  Pt needing cues to mind his nephrostomy tube prior to OOB for toileting and standing grooming. Pt reaching to stabilize on furniture, declines need for any AD to help steady. Administered Short Blessed Test with pt scoring 8/28, indicative of Questionable Impairment and recommendation for further evaluation of dementing disorder. Pt did not recall which staff member help charge his phone this morning, asking for the charger again after staff had already charged it. Pt aware of kidney failure, unaware of medical management. Continue to recommend HHOT and HHRN.       If plan is discharge home, recommend the following:  Direct supervision/assist for medications management;Assistance with cooking/housework   Equipment Recommendations  Other (comment) (pt is declining any AD)    Recommendations for Other Services      Precautions / Restrictions Precautions Precautions: Fall Recall of Precautions/Restrictions: Impaired Precaution/Restrictions Comments: cues to manage L nephrostomy tube with mobility Restrictions Weight Bearing Restrictions Per Provider Order: No       Mobility Bed Mobility Overal bed mobility: Needs Assistance Bed Mobility: Supine to Sit, Sit to Supine     Supine to sit: Supervision Sit to supine: Modified independent (Device/Increase time)        Transfers Overall transfer level: Needs assistance Equipment used: None Transfers: Sit to/from Stand Sit to Stand: Contact guard assist           General transfer comment: CGA for safety; Cues to stand momentarily prior  to ambulation.     Balance Overall balance assessment: Needs assistance   Sitting balance-Leahy Scale: Normal     Standing balance support: No upper extremity supported Standing balance-Leahy Scale: Fair                             ADL either performed or assessed with clinical judgement   ADL Overall ADL's : Needs assistance/impaired     Grooming: Contact guard assist;Standing;Wash/dry hands               Lower Body Dressing: Set up;Sitting/lateral leans Lower Body Dressing Details (indicate cue type and reason): to don socks Toilet Transfer: Contact guard assist;Ambulation Toilet Transfer Details (indicate cue type and reason): seeks support of sink, door frame and grab bar         Functional mobility during ADLs: Contact guard assist      Extremity/Trunk Assessment              Vision       Perception     Praxis     Communication Communication Communication: Impaired Factors Affecting Communication: Hearing impaired   Cognition Arousal: Alert Behavior During Therapy: Impulsive Cognition: Cognition impaired     Awareness: Intellectual awareness impaired, Online awareness impaired Memory impairment (select all impairments): Short-term memory   Executive functioning impairment (select all impairments): Problem solving OT - Cognition Comments: Pt administered Short Blessed Test. Pt with poor awareness of fall risk and ramifications of memory issues, educated.                 Following commands: Intact  Cueing   Cueing Techniques: Verbal cues  Exercises      Shoulder Instructions       General Comments BP after ambulation 179/73    Pertinent Vitals/ Pain       Pain Assessment Pain Assessment: No/denies pain  Home Living                                          Prior Functioning/Environment              Frequency  Min 2X/week        Progress Toward Goals  OT Goals(current  goals can now be found in the care plan section)  Progress towards OT goals: Progressing toward goals  Acute Rehab OT Goals OT Goal Formulation: With patient Time For Goal Achievement: 07/09/24 Potential to Achieve Goals: Good  Plan      Co-evaluation                 AM-PAC OT 6 Clicks Daily Activity     Outcome Measure   Help from another person eating meals?: None Help from another person taking care of personal grooming?: A Little Help from another person toileting, which includes using toliet, bedpan, or urinal?: A Little Help from another person bathing (including washing, rinsing, drying)?: A Little Help from another person to put on and taking off regular upper body clothing?: None Help from another person to put on and taking off regular lower body clothing?: None 6 Click Score: 21    End of Session Equipment Utilized During Treatment: Gait belt  OT Visit Diagnosis: Unsteadiness on feet (R26.81);Other symptoms and signs involving cognitive function   Activity Tolerance Patient tolerated treatment well   Patient Left in bed;with call bell/phone within reach;with bed alarm set   Nurse Communication          Time: 8940-8877 OT Time Calculation (min): 23 min  Charges: OT General Charges $OT Visit: 1 Visit OT Treatments $Self Care/Home Management : 8-22 mins $Cognitive Funtion inital: Initial 15 mins  Mliss HERO, OTR/L Acute Rehabilitation Services Office: [phone]   [name] [name] 06/27/2024, 11:23 AM

## 2024-06-27 NOTE — Progress Notes (Signed)
 Physical Therapy Treatment Patient Details Name: [name] [name] MRN: [id number] DOB: 12-Apr-1951 Today's Date: 06/27/2024   History of Present Illness 73 y.o. male presents to [facility] 06/23/24 with L flank/back pain and abdominal pain. Pt with L hydronephrosis w/ acute renal failure. 8/23 L perc nephrostomy placement. PMHx: HTN, HLD, seizures, AAA,  vitamin D  deficiency, COPD, PAD    PT Comments  Patient attempting to get OOB on his own with bed alarm ringing on arrival. Patient focused on going home to see his dogs and repeats this request numerous times throughout session. Patient with no awareness of nephrostomy tube and when pointed out that we need to take it with him to walk, he states I don't want to take that thing not understanding that it's not an option. Ambulated 300 ft with up to min assist for left staggering imbalance x1. Patient refusing use of RW stating he did not need one PTA. Does admit to feeling weaker than when he was at home. Recommend maximum HH services on discharge, including HHPT (ideally pt would benefit from post-acute inpatient therapies <3 hrs/day, however he will not agree to this).     If plan is discharge home, recommend the following: A little help with walking and/or transfers;Assist for transportation;Help with stairs or ramp for entrance;Assistance with cooking/housework;Direct supervision/assist for medications management;Supervision due to cognitive status   Can travel by private vehicle        Equipment Recommendations  Other (comment) (TBD--could benefit from RW however will likely not agree to use)    Recommendations for Other Services       Precautions / Restrictions Precautions Precautions: Fall Recall of Precautions/Restrictions: Impaired Precaution/Restrictions Comments: L nephrostomy tube (decreased awareness of tube and need to take it with him) Restrictions Weight Bearing Restrictions Per Provider Order: No     Mobility  Bed  Mobility Overal bed mobility: Needs Assistance Bed Mobility: Supine to Sit, Sit to Supine     Supine to sit: Supervision Sit to supine: Independent   General bed mobility comments: HOB up slightly, sitting on EOB with bed alarm going off on arrival    Transfers Overall transfer level: Needs assistance Equipment used: None Transfers: Sit to/from Stand Sit to Stand: Contact guard assist           General transfer comment: CGA for safety; Cues to stand momentarily prior to ambulation.    Ambulation/Gait Ambulation/Gait assistance: Min assist Gait Distance (Feet): 300 Feet Assistive device: 1 person hand held assist, None Gait Pattern/deviations: Step-through pattern, Wide base of support, Staggering left   Gait velocity interpretation: >2.62 ft/sec, indicative of community ambulatory   General Gait Details: pt initially required HHA on left; progressed to no UE support, however then had episode of staggering imbalance to his left with min assist to recover   Stairs             Wheelchair Mobility     Tilt Bed    Modified Rankin (Stroke Patients Only)       Balance Overall balance assessment: Needs assistance Sitting-balance support: No upper extremity supported, Feet supported Sitting balance-Leahy Scale: Normal Sitting balance - Comments: reaching to floor to get his shoes and don shoes   Standing balance support: No upper extremity supported Standing balance-Leahy Scale: Fair                              Communication Communication Communication: Impaired Factors Affecting Communication: Hearing impaired  Cognition Arousal: Alert Behavior During Therapy: Impulsive   PT - Cognitive impairments: Safety/Judgement, Awareness, Memory                       PT - Cognition Comments: states he cannot remember the meds he is on Following commands: Intact      Cueing Cueing Techniques: Verbal cues  Exercises      General  Comments General comments (skin integrity, edema, etc.): BP after ambulation 179/73      Pertinent Vitals/Pain Pain Assessment Pain Assessment: No/denies pain    Home Living                          Prior Function            PT Goals (current goals can now be found in the care plan section) Acute Rehab PT Goals Patient Stated Goal: to go home to his dogs Time For Goal Achievement: 07/08/24 Potential to Achieve Goals: Good Progress towards PT goals: Progressing toward goals    Frequency    Min 2X/week      PT Plan      Co-evaluation              AM-PAC PT 6 Clicks Mobility   Outcome Measure  Help needed turning from your back to your side while in a flat bed without using bedrails?: A Little Help needed moving from lying on your back to sitting on the side of a flat bed without using bedrails?: A Little Help needed moving to and from a bed to a chair (including a wheelchair)?: A Little Help needed standing up from a chair using your arms (e.g., wheelchair or bedside chair)?: A Little Help needed to walk in hospital room?: A Little Help needed climbing 3-5 steps with a railing? : A Little 6 Click Score: 18    End of Session Equipment Utilized During Treatment: Gait belt Activity Tolerance: Patient limited by fatigue Patient left: in bed;with call bell/phone within reach;with bed alarm set Nurse Communication: Other (comment) (pt found trying to get OOB) PT Visit Diagnosis: Other abnormalities of gait and mobility (R26.89)     Time: 8993-8975 PT Time Calculation (min) (ACUTE ONLY): 18 min  Charges:    $Gait Training: 8-22 mins PT General Charges $$ ACUTE PT VISIT: 1 Visit                      Macario RAMAN, PT Acute Rehabilitation Services  Office [phone]    Macario SHAUNNA Soja 06/27/2024, 10:39 AM

## 2024-06-27 NOTE — Progress Notes (Addendum)
 PROGRESS NOTE        PATIENT DETAILS Name: [name] [name] Age: 73 y.o. Sex: male Date of Birth: 1951/03/01 Admit Date: 06/23/2024 Admitting Physician Adriana DELENA Grams, MD ERE:Uzmmb, Norman, NP  Brief Summary: Patient is a 73 y.o.  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  Significant events: 8/23>> admit to [facility].  Significant studies: 8/23>> CT abdomen/pelvis: Moderate left hydronephrosis without urolithiasis, marked right renal parenchymal atrophy. 8/23>> renal ultrasound: Atrophic right kidney, left nephrostomy tube in appropriate position. 8/26>> CT abdomen/pelvis: No perinephric hematoma.  Significant microbiology data: 8/23>> urine culture: No growth.  Procedures: 8/23>> nephrostomy left-by IR  Consults: Nephrology Urology  Subjective: Lying comfortably in bed-denies any chest pain or shortness of breath.  Objective: Vitals: Blood pressure (!) 166/68, pulse 70, temperature 97.7 F (36.5 C), temperature source Oral, resp. rate 17, height 5' 9 (1.753 m), weight 62.7 kg, SpO2 98%.   Exam: Gen Exam:Alert awake-not in any distress HEENT:atraumatic, normocephalic Chest: B/L clear to auscultation anteriorly CVS:S1S2 regular Abdomen:soft non tender, non distended Extremities:no edema Neurology: Non focal Skin: no rash  Pertinent Labs/Radiology:    Latest Ref Rng & Units 06/27/2024    4:57 AM 06/26/2024    5:28 AM 06/25/2024    6:01 AM  CBC  WBC 4.0 - 10.5 K/uL 5.2  4.9  5.2   Hemoglobin 13.0 - 17.0 g/dL 8.4  7.6  8.2   Hematocrit 39.0 - 52.0 % 25.2  22.6  24.6   Platelets 150 - 400 K/uL 167  170  164     Lab Results  Component Value Date   NA 136 06/27/2024   K 3.6 06/27/2024   CL 108 06/27/2024   CO2 22 06/27/2024      Assessment/Plan: AKI Secondary to obstructive uropathy S/p left  nephrostomy in place Renal function gradually improving.  Left-sided hydronephrosis-s/p nephrostomy tube on 8/23 Urology suspects retroperitoneal fibrosis Nephrostomy tube will be continued on discharge Patient will follow-up with urology for further workup in the outpatient setting.  Chronic right kidney atrophy Supportive care  Normocytic anemia Probably to a combination of IV fluid dilution/renal failure-no evidence of blood loss Hb stable Follow CBC periodically  Folic acid /B12 deficiency Continue supplementation Recheck levels in 6 weeks.  HTN BP on the higher side Continue amlodipine /labetalol  Follow/optimize.  HLD Statin  Seizure disorder Depakote   COPD Stable Continue bronchodilators.  PAD-s/p bilateral femoral artery bypass graft 2018 Stable Continue statin   Code status:   Code Status: Full Code   DVT Prophylaxis: heparin  injection 5,000 Units Start: 06/24/24 0600 SCDs Start: 06/23/24 1302   Family Communication: Brother-Mickey-[phone]-updated 8/27    Disposition Plan: Status is: Inpatient Remains inpatient appropriate because: Severity of illness   Planned Discharge Destination:Home   Diet: Diet Order             Diet renal with fluid restriction Fluid restriction: 1200 mL Fluid; Room service appropriate? Yes; Fluid consistency: Thin  Diet effective now                     Antimicrobial agents: Anti-infectives (From admission, onward)    None        MEDICATIONS: Scheduled Meds:  amLODipine   5 mg Oral QPM   atorvastatin   40 mg Oral Daily   cyanocobalamin   1,000 mcg Subcutaneous Daily   divalproex   1,000 mg Oral QHS   folic acid   1 mg Oral Daily   heparin   5,000 Units Subcutaneous Q8H   labetalol   100 mg Oral BID   magnesium  oxide  200 mg Oral Daily   nicotine   21 mg Transdermal Daily   sodium chloride  flush  3 mL Intravenous Q12H   sodium chloride  flush  3 mL Intravenous Q12H   sodium chloride  flush  5 mL  Intracatheter Q8H   Continuous Infusions:  sodium chloride  75 mL/hr at 06/26/24 0523   PRN Meds:.acetaminophen  **OR** acetaminophen , bisacodyl , hydrALAZINE , HYDROmorphone  (DILAUDID ) injection, ipratropium, labetalol , levalbuterol , ondansetron  **OR** ondansetron  (ZOFRAN ) IV, oxyCODONE , senna-docusate, sodium phosphate , traZODone    I have personally reviewed following labs and imaging studies  LABORATORY DATA: CBC: Recent Labs  Lab 06/23/24 1042 06/24/24 0611 06/25/24 0601 06/26/24 0528 06/27/24 0457  WBC 7.8 5.1 5.2 4.9 5.2  NEUTROABS 5.2  --   --   --   --   HGB 12.7* 9.2* 8.2* 7.6* 8.4*  HCT 37.2* 27.0* 24.6* 22.6* 25.2*  MCV 95.6 95.7 96.1 95.0 95.8  PLT 225 169 164 170 167    Basic Metabolic Panel: Recent Labs  Lab 06/23/24 1041 06/23/24 1042 06/24/24 0611 06/25/24 0601 06/26/24 0528 06/27/24 0729  NA  --  131* 136 135 136 136  K  --  4.0 3.7 3.3* 3.6 3.6  CL  --  91* 104 103 105 108  CO2  --  23 22 20* 21* 22  GLUCOSE  --  171* 112* 94 105* 102*  BUN  --  67* 57* 49* 37* 31*  CREATININE  --  9.37* 7.13* 5.52* 3.91* 3.25*  CALCIUM   --  9.0 8.2* 8.1* 8.3* 8.4*  MG 1.9  --   --  1.4*  --   --   PHOS 6.1*  --   --   --   --  3.1    GFR: Estimated Creatinine Clearance: 17.4 mL/min (A) (by C-G formula based on SCr of 3.25 mg/dL (H)).  Liver Function Tests: Recent Labs  Lab 06/23/24 1042 06/24/24 0611 06/25/24 0601 06/26/24 0528 06/27/24 0729  AST 15 10* 12* 12*  --   ALT 6 <5 5 <5  --   ALKPHOS 55 37* 33* 34*  --   BILITOT 1.1 0.6 0.6 0.6  --   PROT 8.3* 6.0* 5.7* 5.7*  --   ALBUMIN  3.8 2.6* 2.4* 2.4* 2.5*   Recent Labs  Lab 06/23/24 1042  LIPASE 37   No results for input(s): AMMONIA in the last 168 hours.  Coagulation Profile: Recent Labs  Lab 06/23/24 1041 06/24/24 0611  INR 1.0 1.1    Cardiac Enzymes: No results for input(s): CKTOTAL, CKMB, CKMBINDEX, TROPONINI in the last 168 hours.  BNP (last 3 results) No results for  input(s): PROBNP in the last 8760 hours.  Lipid Profile: No results for input(s): CHOL, HDL, LDLCALC, TRIG, CHOLHDL, LDLDIRECT in the last 72 hours.  Thyroid  Function Tests: No results for input(s): TSH, T4TOTAL, FREET4, T3FREE, THYROIDAB in the last 72 hours.  Anemia Panel: Recent Labs    06/25/24 1041  VITAMINB12 227  FOLATE 5.9*  FERRITIN 249  TIBC 218*  IRON 63  RETICCTPCT 1.1    Urine analysis:    Component Value Date/Time   COLORURINE STRAW (A) 06/23/2024 1624   APPEARANCEUR CLEAR 06/23/2024 1624   LABSPEC 1.013 06/23/2024 1624  PHURINE 7.0 06/23/2024 1624   GLUCOSEU NEGATIVE 06/23/2024 1624   HGBUR SMALL (A) 06/23/2024 1624   BILIRUBINUR NEGATIVE 06/23/2024 1624   KETONESUR NEGATIVE 06/23/2024 1624   PROTEINUR NEGATIVE 06/23/2024 1624   UROBILINOGEN 0.2 02/05/2011 0937   NITRITE NEGATIVE 06/23/2024 1624   LEUKOCYTESUR NEGATIVE 06/23/2024 1624    Sepsis Labs: Lactic Acid, Venous No results found for: LATICACIDVEN  MICROBIOLOGY: Recent Results (from the past 240 hours)  Aerobic/Anaerobic Culture w Gram Stain (surgical/deep wound)     Status: None (Preliminary result)   Collection Time: 06/23/24  6:19 PM   Specimen: Fluid; Urine  Result Value Ref Range Status   Specimen Description FLUID  Final   Special Requests DRAIN, URINE  Final   Gram Stain NO WBC SEEN NO ORGANISMS SEEN   Final   Culture   Final    NO GROWTH 4 DAYS NO ANAEROBES ISOLATED; CULTURE IN PROGRESS FOR 5 DAYS Performed at [facility], [address], [place], [place] [address]    Report Status PENDING  Incomplete    RADIOLOGY STUDIES/RESULTS: CT ABDOMEN PELVIS WO CONTRAST Result Date: 06/26/2024 CLINICAL DATA:  Follow-up left hydronephrosis, worsening anemia EXAM: CT ABDOMEN AND PELVIS WITHOUT CONTRAST TECHNIQUE: Multidetector CT imaging of the abdomen and pelvis was performed following the standard protocol without IV contrast. RADIATION DOSE REDUCTION:  This exam was performed according to the departmental dose-optimization program which includes automated exposure control, adjustment of the mA and/or kV according to patient size and/or use of iterative reconstruction technique. COMPARISON:  CT and ultrasound from 06/23/2024 FINDINGS: Lower chest: Mild bibasilar atelectatic changes are noted. Small pleural effusions are noted increased when compared with the prior CT examination. Hepatobiliary: Gallbladder is decompressed. Liver appears within normal limits. Pancreas: Unremarkable. No pancreatic ductal dilatation or surrounding inflammatory changes. Spleen: Normal in size without focal abnormality. Adrenals/Urinary Tract: Adrenal glands are within normal limits. Right kidney is somewhat shrunken compared to the left. Left-sided nephrostomy catheter is noted within the renal pelvis. No hydronephrosis is seen. No perinephric hematoma is noted. The bladder is partially distended with opacified urine. Stomach/Bowel: Postsurgical changes are noted in the sigmoid. No obstructive or inflammatory changes of the colon are seen. Fecal material is noted throughout without evidence of constipation or obstruction. The appendix appears within normal limits. Small bowel and stomach are within normal limits. Vascular/Lymphatic: Changes consistent with diffuse aortic calcifications are noted. Prior aortobifemoral bypass graft is noted. Some mild aneurysmal dilatation of the touchdown site is seen stable from prior exam. Reproductive: Prostate is unremarkable. Other: No abdominal wall hernia or abnormality. No abdominopelvic ascites. Musculoskeletal: Degenerative changes of lumbar spine are noted. IMPRESSION: Interval left-sided nephrostomy catheter placement with resolution of hydronephrosis. No findings to suggest perinephric hematoma are seen. No intra-abdominal fluid collection is identified to suggest hemorrhage. Bibasilar atelectasis and small effusions increased from the  prior exam. Chronic changes as described above without acute abnormality. Electronically Signed   By: Oneil Devonshire M.D.   On: 06/26/2024 20:01     LOS: 4 days   Donalda Applebaum, MD  [facility]    To contact the attending provider between 7A-7P or the covering provider during after hours 7P-7A, please log into the web site [email or web address] and access using universal [place] password for that web site. If you do not have the password, please call the hospital operator.  06/27/2024, 1:13 PM

## 2024-06-27 NOTE — Progress Notes (Signed)
 [facility] Progress Note  Subjective:  1.4L UOP in 24h. No events reported overnight .  Seen in room.  Overall doing well. No uremic symptoms   Vitals:   06/26/24 2000 06/27/24 0000 06/27/24 0335 06/27/24 0743  BP:    (!) 166/80  Pulse:   70   Resp:      Temp: 98 F (36.7 C) 98.4 F (36.9 C) 98.4 F (36.9 C) 98.6 F (37 C)  TempSrc: Oral Oral Oral Oral  SpO2:      Weight:      Height:        Exam: Gen alert, no distress No jvd or bruits Chest BLBS RRR  Abd soft ntnd no mass or ascites +bs Ext no LE or UE edema Neuro is alert    L nephrostomy tube   Home bp meds: Norvasc  5 hs Bisoprolol- hydrochlorothiazide 10-6.25 every day Lisinopril  40mg  at bedtime   Date                             Creat               eGFR (ml/min) 2012- dec 2018           0.60- 0.94         03/03/21                        1.09                 73 ml/min 06/23/24                        9.37                 5 ml/min   UA - small Hb, prot neg, 0-5 rbc/wbc UNa 85, UCr 47 CTA today: R kidney atrophic (was normal size on the 2018 CTA study), L kidney moderate hydronephrosis, no definite urolithiasis, ureter appears non-distended and urinary bladder decompressed. Renal US  8/23 post PCN -> R 5.2 cm / L 12 cm w/ nephrostomy tube in place, no hydronephrosis either side.    Assessment/ Plan: Renal failure: last creat was 1.09 in may 2022, eGFR 74 ml/min. Admit creatinine was 9.3 in the setting of 1 week of L flank pain. CT showed sig L sided hydronephrosis and atrophic R kidney. UA was negative. Renal US  done post-PCN showed 12cm L kidney w/ pcn tube in proper position, no hydro; also R kidney atrophic at 5 cm, not likely functional at this size.  AKI likely due to L-sided obstruction. May also have some CKD atrophic R kidney but not possible to say how significant at this point. IR placed L PCN 8/23 w/ good UOP overnight thru the drain. Creatinine down to 3.25 today. No uremic symptoms.  Urology is also following. F/u labs in am, cont renal diet for now  Encourage p.o. intake instead of IVF.  Will need hospital follow-up at discharge HTN: Improved. BP 230/ 98 on presentation. Home ACEi and hydrochlorothiazide on hold appropriately. Getting norvasc  + po labetalol  here. Bp have normalized.  COPD: not on any O2   Dr. Evalene Lanes  [facility] 06/27/2024, 7:51 AM  Recent Labs  Lab 06/23/24 1041 06/23/24 1042 06/25/24 0601 06/26/24 0528 06/27/24 0457  HGB  --    < > 8.2* 7.6* 8.4*  ALBUMIN   --    < > 2.4*  2.4*  --   CALCIUM   --    < > 8.1* 8.3*  --   PHOS 6.1*  --   --   --   --   CREATININE  --    < > 5.52* 3.91*  --   K  --    < > 3.3* 3.6  --    < > = values in this interval not displayed.   Recent Labs  Lab 06/25/24 1041  IRON 63  TIBC 218*  FERRITIN 249   Inpatient medications:  amLODipine   5 mg Oral QPM   atorvastatin   40 mg Oral Daily   cyanocobalamin   1,000 mcg Subcutaneous Daily   divalproex   1,000 mg Oral QHS   folic acid   1 mg Oral Daily   heparin   5,000 Units Subcutaneous Q8H   labetalol   100 mg Oral BID   magnesium  oxide  200 mg Oral Daily   nicotine   21 mg Transdermal Daily   sodium chloride  flush  3 mL Intravenous Q12H   sodium chloride  flush  3 mL Intravenous Q12H   sodium chloride  flush  5 mL Intracatheter Q8H    sodium chloride  75 mL/hr at 06/26/24 0523    acetaminophen  **OR** acetaminophen , bisacodyl , hydrALAZINE , HYDROmorphone  (DILAUDID ) injection, ipratropium, labetalol , levalbuterol , ondansetron  **OR** ondansetron  (ZOFRAN ) IV, oxyCODONE , senna-docusate, sodium phosphate , traZODone 

## 2024-06-28 DIAGNOSIS — D649 Anemia, unspecified: Secondary | ICD-10-CM | POA: Diagnosis not present

## 2024-06-28 DIAGNOSIS — N1330 Unspecified hydronephrosis: Secondary | ICD-10-CM | POA: Diagnosis not present

## 2024-06-28 DIAGNOSIS — N179 Acute kidney failure, unspecified: Secondary | ICD-10-CM | POA: Diagnosis not present

## 2024-06-28 DIAGNOSIS — I159 Secondary hypertension, unspecified: Secondary | ICD-10-CM | POA: Diagnosis not present

## 2024-06-28 LAB — RENAL FUNCTION PANEL
Albumin: 2.5 g/dL — ABNORMAL LOW (ref 3.5–5.0)
Anion gap: 8 (ref 5–15)
BUN: 27 mg/dL — ABNORMAL HIGH (ref 8–23)
CO2: 24 mmol/L (ref 22–32)
Calcium: 8.9 mg/dL (ref 8.9–10.3)
Chloride: 108 mmol/L (ref 98–111)
Creatinine, Ser: 2.46 mg/dL — ABNORMAL HIGH (ref 0.61–1.24)
GFR, Estimated: 27 mL/min — ABNORMAL LOW (ref >60)
Glucose, Bld: 116 mg/dL — ABNORMAL HIGH (ref 70–99)
Phosphorus: 2.9 mg/dL (ref 2.5–4.6)
Potassium: 3.8 mmol/L (ref 3.5–5.1)
Sodium: 140 mmol/L (ref 135–145)

## 2024-06-28 LAB — CBC
HCT: 24.7 % — ABNORMAL LOW (ref 39.0–52.0)
Hemoglobin: 8.2 g/dL — ABNORMAL LOW (ref 13.0–17.0)
MCH: 31.8 pg (ref 26.0–34.0)
MCHC: 33.2 g/dL (ref 30.0–36.0)
MCV: 95.7 fL (ref 80.0–100.0)
Platelets: 166 K/uL (ref 150–400)
RBC: 2.58 MIL/uL — ABNORMAL LOW (ref 4.22–5.81)
RDW: 13.8 % (ref 11.5–15.5)
WBC: 3.9 K/uL — ABNORMAL LOW (ref 4.0–10.5)
nRBC: 0 % (ref 0.0–0.2)

## 2024-06-28 LAB — GLUCOSE, CAPILLARY
Glucose-Capillary: 92 mg/dL (ref 70–99)
Glucose-Capillary: 92 mg/dL (ref 70–99)

## 2024-06-28 LAB — AEROBIC/ANAEROBIC CULTURE W GRAM STAIN (SURGICAL/DEEP WOUND)
Culture: NO GROWTH
Gram Stain: NONE SEEN

## 2024-06-28 MED ORDER — AMLODIPINE BESYLATE 10 MG PO TABS
10.0000 mg | ORAL_TABLET | Freq: Every evening | ORAL | Status: DC
  Administered 2024-06-28: 10 mg via ORAL
  Filled 2024-06-28 (×2): qty 1

## 2024-06-28 NOTE — Progress Notes (Signed)
 Referring Provider(s): Willette Adriana LABOR, MD   Supervising Physician: Jenna Hacker  Patient Status:  [facility] - In-pt  Chief Complaint: Left hydronephrosis, acute renal failure, suspect left ureteral obstruction - s/p left percutaneous nephrostomy 8/23 by Dr. Johann   Subjective:  Patient alert and laying in bed, calm.  Currently without any significant complaints.  Patient denies any fevers, headache, chest pain, SOB, cough, abdominal pain, nausea, vomiting or bleeding.    Allergies: Patient has no known allergies.  Medications: Prior to Admission medications   Medication Sig Start Date End Date Taking? Authorizing Provider  amLODipine  (NORVASC ) 5 MG tablet Take 5 mg by mouth every evening.    Yes [provider]  atorvastatin  (LIPITOR) 40 MG tablet Take 40 mg by mouth daily. 04/04/24  Yes [provider]  bisoprolol-hydrochlorothiazide (ZIAC) 10-6.25 MG tablet Take 1 tablet by mouth daily. 04/16/24  Yes [provider]  divalproex  (DEPAKOTE  ER) 500 MG 24 hr tablet Take 1,000 mg by mouth at bedtime. 04/24/24  Yes [provider]  meloxicam (MOBIC) 7.5 MG tablet Take 7.5 mg by mouth daily.   Yes [provider]  olmesartan (BENICAR) 40 MG tablet Take 40 mg by mouth daily.    [provider]     Vital Signs: BP (!) 144/69 (BP Location: Left Arm)   Pulse 65   Temp 97.6 F (36.4 C) (Oral)   Resp 18   Ht 5' 9 (1.753 m)   Wt 138 lb 3.7 oz (62.7 kg)   SpO2 95%   BMI 20.41 kg/m   Physical Exam Constitutional:      Appearance: Normal appearance.  Cardiovascular:     Rate and Rhythm: Normal rate.  Pulmonary:     Effort: Pulmonary effort is normal.  Musculoskeletal:        General: Normal range of motion.  Skin:    General: Skin is warm and dry.     Comments: Left flank drain appropriately dressed. Dressing is clean, dry, intact. Drain incision site non-tender, without evidence of infection. Retaining suture and  Stat Lock in place.  Approx. 100 cc of blood-tinged urine output in collection bag. Line flushes well.     Neurological:     Mental Status: He is alert and oriented to person, place, and time.      Labs:  CBC: Recent Labs    06/25/24 0601 06/26/24 0528 06/27/24 0457 06/28/24 0617  WBC 5.2 4.9 5.2 3.9*  HGB 8.2* 7.6* 8.4* 8.2*  HCT 24.6* 22.6* 25.2* 24.7*  PLT 164 170 167 166    COAGS: Recent Labs    06/23/24 1041 06/24/24 0611  INR 1.0 1.1  APTT  --  28    BMP: Recent Labs    06/25/24 0601 06/26/24 0528 06/27/24 0729 06/28/24 0617  NA 135 136 136 140  K 3.3* 3.6 3.6 3.8  CL 103 105 108 108  CO2 20* 21* 22 24  GLUCOSE 94 105* 102* 116*  BUN 49* 37* 31* 27*  CALCIUM  8.1* 8.3* 8.4* 8.9  CREATININE 5.52* 3.91* 3.25* 2.46*  GFRNONAA 10* 15* 19* 27*    LIVER FUNCTION TESTS: Recent Labs    06/23/24 1042 06/24/24 0611 06/25/24 0601 06/26/24 0528 06/27/24 0729 06/28/24 0617  BILITOT 1.1 0.6 0.6 0.6  --   --   AST 15 10* 12* 12*  --   --   ALT 6 <5 5 <5  --   --   ALKPHOS 55 37* 33* 34*  --   --  PROT 8.3* 6.0* 5.7* 5.7*  --   --   ALBUMIN  3.8 2.6* 2.4* 2.4* 2.5* 2.5*    Assessment and Plan:  Drain Location: left flank Size: Fr size: 10 Fr Date of placement: 06/23/2024  Currently to: Drain collection device: gravity 24 hour output:  Output by Drain (mL) 06/26/24 0701 - 06/26/24 1900 06/26/24 1901 - 06/27/24 0700 06/27/24 0701 - 06/27/24 1900 06/27/24 1901 - 06/28/24 0700 06/28/24 0701 - 06/28/24 1617  Requested LDAs do not have output data documented.    Interval imaging/drain manipulation:  None.  Current examination: Flushes/aspirates easily.  Insertion site unremarkable. Suture and stat lock in place. Dressed appropriately.   Plan: Continue TID flushes with 5 cc NS. Record output Q shift. Dressing changes QD or PRN if soiled.  Call IR APP or on call IR MD if difficulty flushing or sudden change in drain output.  Repeat  imaging/possible drain injection once output < 10 mL/QD (excluding flush material). Consideration for drain removal if output is < 10 mL/QD (excluding flush material), pending discussion with the providing surgical service.  Discharge planning: Patient will follow outpatient with urology for management.  Outpatient orders placed for IR f/u. Typically patient will follow up with IR every 6-8 weeks for PCN exchanges. IR scheduler will contact patient with date/time of appointment. Patient will need to flush drain QD with 5 cc NS, record output QD, dressing changes every 2-3 days or earlier if soiled.   IR will continue to follow - please call with questions or concerns.    Thank you for this interesting consult.  I greatly enjoyed meeting SCHON ZEIDERS and look forward to participating in their care.   Electronically Signed: Carlin DELENA Griffon, PA-C 06/28/2024, 4:17 PM     I spent a total of 15 Minutes at the the patient's bedside AND on the patient's hospital floor or unit, greater than 50% of which was counseling/coordinating care for  s/p L PCN 8/23.

## 2024-06-28 NOTE — Progress Notes (Signed)
 PROGRESS NOTE        PATIENT DETAILS Name: [name] [name] Age: 73 y.o. Sex: male Date of Birth: 13-Jan-1951 Admit Date: 06/23/2024 Admitting Physician Adriana DELENA Grams, MD ERE:[facility]zm[facility], [place], NP  Brief Summary: Patient is a 73 y.o.  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  Significant events: 8/23>> admit to [facility].  Significant studies: 8/23>> CT abdomen/pelvis: Moderate left hydronephrosis without urolithiasis, marked right renal parenchymal atrophy. 8/23>> renal ultrasound: Atrophic right kidney, left nephrostomy tube in appropriate position. 8/26>> CT abdomen/pelvis: No perinephric hematoma.  Significant microbiology data: 8/23>> urine culture: No growth.  Procedures: 8/23>> nephrostomy left-by IR  Consults: Nephrology Urology  Subjective: Frustrated that he is still hospitalized-was agitated but calmed down after reassurance.  No other issues overnight.  Objective: Vitals: Blood pressure (!) 167/74, pulse 65, temperature 98.2 F (36.8 C), temperature source Oral, resp. rate 18, height 5' 9 (1.753 m), weight 62.7 kg, SpO2 95%.   Exam: Gen Exam:Alert awake-not in any distress HEENT:atraumatic, normocephalic Chest: B/L clear to auscultation anteriorly CVS:S1S2 regular Abdomen:soft non tender, non distended Extremities:no edema Neurology: Non focal Skin: no rash  Pertinent Labs/Radiology:    Latest Ref Rng & Units 06/28/2024    6:17 AM 06/27/2024    4:57 AM 06/26/2024    5:28 AM  CBC  WBC 4.0 - 10.5 K/uL 3.9  5.2  4.9   Hemoglobin 13.0 - 17.0 g/dL 8.2  8.4  7.6   Hematocrit 39.0 - 52.0 % 24.7  25.2  22.6   Platelets 150 - 400 K/uL 166  167  170     Lab Results  Component Value Date   NA 140 06/28/2024   K 3.8 06/28/2024   CL 108 06/28/2024   CO2 24 06/28/2024       Assessment/Plan: AKI Secondary to obstructive uropathy S/p left nephrostomy in place Renal function continues to improve-stop IV fluids today-encourage oral intake-repeat electrolytes tomorrow-if stable-likely home with outpatient follow-up with urology/IR.    Left-sided hydronephrosis-s/p nephrostomy tube on 8/23 Urology suspects retroperitoneal fibrosis Nephrostomy tube will be continued on discharge Reached to urology-[facility] office will arrange for follow-up Reach out to IR-follow-up arrangements already in place  Chronic right kidney atrophy Supportive care  Normocytic anemia Probably to a combination of IV fluid dilution/renal failure-no evidence of blood loss Hb stable Follow CBC periodically  Folic acid /B12 deficiency Continue supplementation Recheck levels in 6 weeks.  HTN BP remains on the higher side Increase amlodipine  to 10 mg daily Continue current dosing of labetalol  Follow/optimize   HLD Statin  Seizure disorder Depakote   COPD Stable Continue bronchodilators.  PAD-s/p bilateral femoral artery bypass graft 2018 Stable Continue statin   Code status:   Code Status: Full Code   DVT Prophylaxis: heparin  injection 5,000 Units Start: 06/24/24 0600 SCDs Start: 06/23/24 1302   Family Communication: Brother-[phone]-updated 8/27    Disposition Plan: Status is: Inpatient Remains inpatient appropriate because: Severity of illness   Planned Discharge Destination:Home   Diet: Diet Order             Diet renal with fluid restriction Fluid restriction: 1200 mL Fluid; Room service appropriate? Yes; Fluid consistency: Thin  Diet effective now  Antimicrobial agents: Anti-infectives (From admission, onward)    None        MEDICATIONS: Scheduled Meds:  amLODipine   5 mg Oral QPM   atorvastatin   40 mg Oral Daily   cyanocobalamin   1,000 mcg Subcutaneous Daily   divalproex   1,000 mg Oral  QHS   folic acid   2 mg Oral Daily   heparin   5,000 Units Subcutaneous Q8H   labetalol   100 mg Oral BID   magnesium  oxide  200 mg Oral Daily   nicotine   21 mg Transdermal Daily   sodium chloride  flush  3 mL Intravenous Q12H   sodium chloride  flush  3 mL Intravenous Q12H   sodium chloride  flush  5 mL Intracatheter Q8H   Continuous Infusions:   PRN Meds:.acetaminophen  **OR** acetaminophen , bisacodyl , hydrALAZINE , HYDROmorphone  (DILAUDID ) injection, ipratropium, labetalol , levalbuterol , ondansetron  **OR** ondansetron  (ZOFRAN ) IV, oxyCODONE , senna-docusate, sodium phosphate , traZODone    I have personally reviewed following labs and imaging studies  LABORATORY DATA: CBC: Recent Labs  Lab 06/23/24 1042 06/24/24 0611 06/25/24 0601 06/26/24 0528 06/27/24 0457 06/28/24 0617  WBC 7.8 5.1 5.2 4.9 5.2 3.9*  NEUTROABS 5.2  --   --   --   --   --   HGB 12.7* 9.2* 8.2* 7.6* 8.4* 8.2*  HCT 37.2* 27.0* 24.6* 22.6* 25.2* 24.7*  MCV 95.6 95.7 96.1 95.0 95.8 95.7  PLT 225 169 164 170 167 166    Basic Metabolic Panel: Recent Labs  Lab 06/23/24 1041 06/23/24 1042 06/24/24 0611 06/25/24 0601 06/26/24 0528 06/27/24 0729 06/28/24 0617  NA  --    < > 136 135 136 136 140  K  --    < > 3.7 3.3* 3.6 3.6 3.8  CL  --    < > 104 103 105 108 108  CO2  --    < > 22 20* 21* 22 24  GLUCOSE  --    < > 112* 94 105* 102* 116*  BUN  --    < > 57* 49* 37* 31* 27*  CREATININE  --    < > 7.13* 5.52* 3.91* 3.25* 2.46*  CALCIUM   --    < > 8.2* 8.1* 8.3* 8.4* 8.9  MG 1.9  --   --  1.4*  --   --   --   PHOS 6.1*  --   --   --   --  3.1 2.9   < > = values in this interval not displayed.    GFR: Estimated Creatinine Clearance: 23 mL/min (A) (by C-G formula based on SCr of 2.46 mg/dL (H)).  Liver Function Tests: Recent Labs  Lab 06/23/24 1042 06/24/24 0611 06/25/24 0601 06/26/24 0528 06/27/24 0729 06/28/24 0617  AST 15 10* 12* 12*  --   --   ALT 6 <5 5 <5  --   --   ALKPHOS 55 37* 33* 34*  --    --   BILITOT 1.1 0.6 0.6 0.6  --   --   PROT 8.3* 6.0* 5.7* 5.7*  --   --   ALBUMIN  3.8 2.6* 2.4* 2.4* 2.5* 2.5*   Recent Labs  Lab 06/23/24 1042  LIPASE 37   No results for input(s): AMMONIA in the last 168 hours.  Coagulation Profile: Recent Labs  Lab 06/23/24 1041 06/24/24 0611  INR 1.0 1.1    Cardiac Enzymes: No results for input(s): CKTOTAL, CKMB, CKMBINDEX, TROPONINI in the last 168 hours.  BNP (last 3 results) No results for input(s): PROBNP in  the last 8760 hours.  Lipid Profile: No results for input(s): CHOL, HDL, LDLCALC, TRIG, CHOLHDL, LDLDIRECT in the last 72 hours.  Thyroid  Function Tests: No results for input(s): TSH, T4TOTAL, FREET4, T3FREE, THYROIDAB in the last 72 hours.  Anemia Panel: No results for input(s): VITAMINB12, FOLATE, FERRITIN, TIBC, IRON, RETICCTPCT in the last 72 hours.   Urine analysis:    Component Value Date/Time   COLORURINE STRAW (A) 06/23/2024 1624   APPEARANCEUR CLEAR 06/23/2024 1624   LABSPEC 1.013 06/23/2024 1624   PHURINE 7.0 06/23/2024 1624   GLUCOSEU NEGATIVE 06/23/2024 1624   HGBUR SMALL (A) 06/23/2024 1624   BILIRUBINUR NEGATIVE 06/23/2024 1624   KETONESUR NEGATIVE 06/23/2024 1624   PROTEINUR NEGATIVE 06/23/2024 1624   UROBILINOGEN 0.2 02/05/2011 0937   NITRITE NEGATIVE 06/23/2024 1624   LEUKOCYTESUR NEGATIVE 06/23/2024 1624    Sepsis Labs: Lactic Acid, Venous No results found for: LATICACIDVEN  MICROBIOLOGY: Recent Results (from the past 240 hours)  Aerobic/Anaerobic Culture w Gram Stain (surgical/deep wound)     Status: None (Preliminary result)   Collection Time: 06/23/24  6:19 PM   Specimen: Fluid; Urine  Result Value Ref Range Status   Specimen Description FLUID  Final   Special Requests DRAIN, URINE  Final   Gram Stain NO WBC SEEN NO ORGANISMS SEEN   Final   Culture   Final    NO GROWTH 4 DAYS NO ANAEROBES ISOLATED; CULTURE IN PROGRESS FOR 5  DAYS Performed at [facility], [address] [place], [place] [address]    Report Status PENDING  Incomplete    RADIOLOGY STUDIES/RESULTS: CT ABDOMEN PELVIS WO CONTRAST Result Date: 06/26/2024 CLINICAL DATA:  Follow-up left hydronephrosis, worsening anemia EXAM: CT ABDOMEN AND PELVIS WITHOUT CONTRAST TECHNIQUE: Multidetector CT imaging of the abdomen and pelvis was performed following the standard protocol without IV contrast. RADIATION DOSE REDUCTION: This exam was performed according to the departmental dose-optimization program which includes automated exposure control, adjustment of the mA and/or kV according to patient size and/or use of iterative reconstruction technique. COMPARISON:  CT and ultrasound from 06/23/2024 FINDINGS: Lower chest: Mild bibasilar atelectatic changes are noted. Small pleural effusions are noted increased when compared with the prior CT examination. Hepatobiliary: Gallbladder is decompressed. Liver appears within normal limits. Pancreas: Unremarkable. No pancreatic ductal dilatation or surrounding inflammatory changes. Spleen: Normal in size without focal abnormality. Adrenals/Urinary Tract: Adrenal glands are within normal limits. Right kidney is somewhat shrunken compared to the left. Left-sided nephrostomy catheter is noted within the renal pelvis. No hydronephrosis is seen. No perinephric hematoma is noted. The bladder is partially distended with opacified urine. Stomach/Bowel: Postsurgical changes are noted in the sigmoid. No obstructive or inflammatory changes of the colon are seen. Fecal material is noted throughout without evidence of constipation or obstruction. The appendix appears within normal limits. Small bowel and stomach are within normal limits. Vascular/Lymphatic: Changes consistent with diffuse aortic calcifications are noted. Prior aortobifemoral bypass graft is noted. Some mild aneurysmal dilatation of the touchdown site is seen stable from prior  exam. Reproductive: Prostate is unremarkable. Other: No abdominal wall hernia or abnormality. No abdominopelvic ascites. Musculoskeletal: Degenerative changes of lumbar spine are noted. IMPRESSION: Interval left-sided nephrostomy catheter placement with resolution of hydronephrosis. No findings to suggest perinephric hematoma are seen. No intra-abdominal fluid collection is identified to suggest hemorrhage. Bibasilar atelectasis and small effusions increased from the prior exam. Chronic changes as described above without acute abnormality. Electronically Signed   By: Oneil Evelyne HERO.D.  On: 06/26/2024 20:01     LOS: 5 days   Donalda Applebaum, MD  [facility]    To contact the attending provider between 7A-7P or the covering provider during after hours 7P-7A, please log into the web site [email or web address] and access using universal [place] password for that web site. If you do not have the password, please call the hospital operator.  06/28/2024, 11:50 AM

## 2024-06-28 NOTE — Plan of Care (Signed)

## 2024-06-28 NOTE — Progress Notes (Signed)
 Physical Therapy Treatment Patient Details Name: [name] [name] MRN: [id number] DOB: 1951-06-11 Today's Date: 06/28/2024   History of Present Illness 73 y.o. male presents to [facility]/23/24 with L flank/back pain and abdominal pain. Pt with L hydronephrosis w/ acute renal failure. 8/23 L perc nephrostomy placement. PMHx: HTN, HLD, seizures, AAA,  vitamin D  deficiency, COPD, PAD    PT Comments  Pt received in bed and relays that he had very bad behavior earlier in day because he did not want heart leads placed back on chest. He is also agitated because he wants to go home to see his dogs. Pt independent with bed mobility. He is unsteady in standing and has decreased insight into limitations. He was agreeable to RW use today which did help prevent a full LOB in hall when he had a stagger to R. Worked on standing balance and increasing standing tolerance. Patient will benefit from continued inpatient follow up therapy, <3 hours/day due to pt's balance impairments and cognitive deficits, however, pt is adamantly refusing this. If this continues to be the case, rec HHPT. PT will continue to follow.     If plan is discharge home, recommend the following: A little help with walking and/or transfers;Assist for transportation;Help with stairs or ramp for entrance;Assistance with cooking/housework;Direct supervision/assist for medications management;Supervision due to cognitive status   Can travel by private vehicle     Yes  Equipment Recommendations  Rolling walker (2 wheels)    Recommendations for Other Services       Precautions / Restrictions Precautions Precautions: Fall Recall of Precautions/Restrictions: Impaired Precaution/Restrictions Comments: cues to manage L nephrostomy tube with mobility Restrictions Weight Bearing Restrictions Per Provider Order: No     Mobility  Bed Mobility Overal bed mobility: Needs Assistance Bed Mobility: Supine to Sit     Supine to sit: Modified  independent (Device/Increase time)     General bed mobility comments: pt able to come to EOB from flat bed without use of rails    Transfers Overall transfer level: Needs assistance Equipment used: Rolling walker (2 wheels) Transfers: Sit to/from Stand Sit to Stand: Contact guard assist           General transfer comment: CGA for safety; Cues to stand momentarily prior to ambulation.    Ambulation/Gait Ambulation/Gait assistance: Min assist Gait Distance (Feet): 250 Feet Assistive device: Rolling walker (2 wheels) Gait Pattern/deviations: Step-through pattern, Wide base of support, Staggering left Gait velocity: decreased Gait velocity interpretation: <1.8 ft/sec, indicate of risk for recurrent falls   General Gait Details: pt with several stagger steps and caught self with UE's with use of RW. Encouraged to use RW as a tool to make himself more safe and independent. Pt agreeable today but agreeability fluctuates. Needed frequent cues to remain within RW and stepped all the way out of it with turning. Cues for self monitoring for fatigue as pt had already stood at sink for teeth brushing and gone to the bathroom. He did initiate turning when fatigued.   Stairs             Wheelchair Mobility     Tilt Bed    Modified Rankin (Stroke Patients Only)       Balance Overall balance assessment: Needs assistance Sitting-balance support: No upper extremity supported, Feet supported Sitting balance-Leahy Scale: Normal Sitting balance - Comments: reaching to floor to get his shoes and don shoes   Standing balance support: No upper extremity supported Standing balance-Leahy Scale: Fair Standing balance comment:  stood at sink>10 mins for cleaning dentures and hands. Fatigued and placed elbows on sink after ~7 mins but deferred taking a seated rest. Needs at least unilateral UE support for safety. Stood for toileting and had increased sway when not holding rail                             Communication Communication Communication: Impaired Factors Affecting Communication: Hearing impaired  Cognition Arousal: Alert Behavior During Therapy: Impulsive   PT - Cognitive impairments: Safety/Judgement, Awareness, Memory                       PT - Cognition Comments: pt repeats self throughout session, focuses on one thing and has difficulty switching focus. Did follow commands and was agreeable to use of RW today. Apparently got very upset about telemetry being put back on today and was rude to his RN but then apologized to RN while PT in room Following commands: Intact      Cueing Cueing Techniques: Verbal cues  Exercises      General Comments General comments (skin integrity, edema, etc.): VSS. Pt left in chair with his lunch. Chair alarm on      Pertinent Vitals/Pain Pain Assessment Pain Assessment: No/denies pain    Home Living                          Prior Function            PT Goals (current goals can now be found in the care plan section) Acute Rehab PT Goals Patient Stated Goal: be with his dogs PT Goal Formulation: With patient Time For Goal Achievement: 07/08/24 Potential to Achieve Goals: Good Progress towards PT goals: Progressing toward goals    Frequency    Min 2X/week      PT Plan      Co-evaluation              AM-PAC PT 6 Clicks Mobility   Outcome Measure  Help needed turning from your back to your side while in a flat bed without using bedrails?: None Help needed moving from lying on your back to sitting on the side of a flat bed without using bedrails?: None Help needed moving to and from a bed to a chair (including a wheelchair)?: A Little Help needed standing up from a chair using your arms (e.g., wheelchair or bedside chair)?: A Little Help needed to walk in hospital room?: A Little Help needed climbing 3-5 steps with a railing? : A Little 6 Click Score: 20    End of  Session Equipment Utilized During Treatment: Gait belt Activity Tolerance: Patient tolerated treatment well Patient left: with call bell/phone within reach;in chair;with chair alarm set Nurse Communication: Mobility status PT Visit Diagnosis: Other abnormalities of gait and mobility (R26.89)     Time: 8767-8690 PT Time Calculation (min) (ACUTE ONLY): 37 min  Charges:    $Gait Training: 8-22 mins $Therapeutic Activity: 8-22 mins PT General Charges $$ ACUTE PT VISIT: 1 Visit                     [name] [name], PT  Acute Rehab Services Secure chat preferred Office [phone]    [name] CROME Baldo Hufnagle 06/28/2024, 2:19 PM

## 2024-06-28 NOTE — Progress Notes (Signed)
 [facility] Progress Note  Subjective:  1.2L UOP in 24h. No events reported overnight.  Had a frustrating morning.  Otherwise doing well. No uremic symptoms   Vitals:   06/27/24 1112 06/27/24 1530 06/27/24 2030 06/28/24 0624  BP: (!) 166/68 (!) 150/74 (!) 173/90 (!) 162/72  Pulse:      Resp:   17 18  Temp: 97.7 F (36.5 C) 98.7 F (37.1 C) 97.8 F (36.6 C) 98.3 F (36.8 C)  TempSrc: Oral Oral Oral Oral  SpO2:    95%  Weight:      Height:        Exam: Gen alert, no distress No jvd or bruits Chest BLBS RRR  Abd soft ntnd no mass or ascites +bs Ext no LE or UE edema Neuro is alert    L nephrostomy tube   Home bp meds: Norvasc  5 hs Bisoprolol- hydrochlorothiazide 10-6.25 every day Lisinopril  40mg  at bedtime   Date                             Creat               eGFR (ml/min) 2012- dec 2018           0.60- 0.94         03/03/21                        1.09                 73 ml/min 06/23/24                        9.37                 5 ml/min   UA - small Hb, prot neg, 0-5 rbc/wbc UNa 85, UCr 47 CTA today: R kidney atrophic (was normal size on the 2018 CTA study), L kidney moderate hydronephrosis, no definite urolithiasis, ureter appears non-distended and urinary bladder decompressed. Renal US  8/23 post PCN -> R 5.2 cm / L 12 cm w/ nephrostomy tube in place, no hydronephrosis either side.    Assessment/ Plan: Renal failure: last creat was 1.09 in may 2022, eGFR 74 ml/min. Admit creatinine was 9.3 in the setting of 1 week of L flank pain. CT showed sig L sided hydronephrosis and atrophic R kidney. UA was negative. Renal US  done post-PCN showed 12cm L kidney w/ pcn tube in proper position, no hydro; also R kidney atrophic at 5 cm, not likely functional at this size.  AKI likely due to L-sided obstruction. May also have some CKD atrophic R kidney but not possible to say how significant at this point. IR placed L PCN 8/23 w/ good UOP overnight thru the drain.  Creatinine down to 2.46 today. No uremic symptoms. Urology is also following. F/u labs in am, cont renal diet for now  Encourage p.o. intake Will need hospital follow-up at discharge HTN: Improved. BP 230/ 98 on presentation. Home ACEi and hydrochlorothiazide on hold appropriately. Getting norvasc  + po labetalol  here. Bp have normalized.  COPD: not on any O2   Dr. Evalene Lanes  [facility] 06/28/2024, 7:46 AM  Recent Labs  Lab 06/27/24 0457 06/27/24 0729 06/28/24 0617  HGB 8.4*  --  8.2*  ALBUMIN   --  2.5* 2.5*  CALCIUM   --  8.4* 8.9  PHOS  --  3.1  2.9  CREATININE  --  3.25* 2.46*  K  --  3.6 3.8   Recent Labs  Lab 06/25/24 1041  IRON 63  TIBC 218*  FERRITIN 249   Inpatient medications:  amLODipine   5 mg Oral QPM   atorvastatin   40 mg Oral Daily   cyanocobalamin   1,000 mcg Subcutaneous Daily   divalproex   1,000 mg Oral QHS   folic acid   2 mg Oral Daily   heparin   5,000 Units Subcutaneous Q8H   labetalol   100 mg Oral BID   magnesium  oxide  200 mg Oral Daily   nicotine   21 mg Transdermal Daily   sodium chloride  flush  3 mL Intravenous Q12H   sodium chloride  flush  3 mL Intravenous Q12H   sodium chloride  flush  5 mL Intracatheter Q8H    sodium chloride  50 mL/hr at 06/27/24 1424    acetaminophen  **OR** acetaminophen , bisacodyl , hydrALAZINE , HYDROmorphone  (DILAUDID ) injection, ipratropium, labetalol , levalbuterol , ondansetron  **OR** ondansetron  (ZOFRAN ) IV, oxyCODONE , senna-docusate, sodium phosphate , traZODone 

## 2024-06-29 ENCOUNTER — Other Ambulatory Visit (HOSPITAL_COMMUNITY): Payer: Self-pay

## 2024-06-29 DIAGNOSIS — N1330 Unspecified hydronephrosis: Secondary | ICD-10-CM | POA: Diagnosis not present

## 2024-06-29 DIAGNOSIS — D649 Anemia, unspecified: Secondary | ICD-10-CM | POA: Diagnosis not present

## 2024-06-29 DIAGNOSIS — N179 Acute kidney failure, unspecified: Secondary | ICD-10-CM | POA: Diagnosis not present

## 2024-06-29 DIAGNOSIS — I159 Secondary hypertension, unspecified: Secondary | ICD-10-CM | POA: Diagnosis not present

## 2024-06-29 LAB — BASIC METABOLIC PANEL WITH GFR
Anion gap: 11 (ref 5–15)
BUN: 25 mg/dL — ABNORMAL HIGH (ref 8–23)
CO2: 21 mmol/L — ABNORMAL LOW (ref 22–32)
Calcium: 8.9 mg/dL (ref 8.9–10.3)
Chloride: 104 mmol/L (ref 98–111)
Creatinine, Ser: 2.24 mg/dL — ABNORMAL HIGH (ref 0.61–1.24)
GFR, Estimated: 30 mL/min — ABNORMAL LOW (ref >60)
Glucose, Bld: 83 mg/dL (ref 70–99)
Potassium: 3.6 mmol/L (ref 3.5–5.1)
Sodium: 136 mmol/L (ref 135–145)

## 2024-06-29 LAB — GLUCOSE, CAPILLARY: Glucose-Capillary: 84 mg/dL (ref 70–99)

## 2024-06-29 MED ORDER — LABETALOL HCL 100 MG PO TABS
100.0000 mg | ORAL_TABLET | Freq: Two times a day (BID) | ORAL | 1 refills | Status: AC
  Filled 2024-06-29: qty 60, 30d supply, fill #0

## 2024-06-29 MED ORDER — FOLIC ACID 1 MG PO TABS
1.0000 mg | ORAL_TABLET | Freq: Every day | ORAL | 1 refills | Status: AC
  Filled 2024-06-29: qty 30, 30d supply, fill #0

## 2024-06-29 MED ORDER — VITAMIN B-12 1000 MCG PO TABS
1000.0000 ug | ORAL_TABLET | Freq: Every day | ORAL | 1 refills | Status: AC
  Filled 2024-06-29: qty 30, 30d supply, fill #0

## 2024-06-29 MED ORDER — AMLODIPINE BESYLATE 10 MG PO TABS
10.0000 mg | ORAL_TABLET | Freq: Every evening | ORAL | 2 refills | Status: AC
  Filled 2024-06-29: qty 30, 30d supply, fill #0

## 2024-06-29 NOTE — Progress Notes (Signed)
  [facility] Progress Note  Subjective:  1.3L UOP in 24h. No events reported overnight.  At baseline.  Pending discharge.  Vitals:   06/28/24 0821 06/28/24 1232 06/28/24 2044 06/29/24 0532  BP: (!) 167/74 (!) 144/69 (!) 187/75 (!) 143/67  Pulse: 65     Resp:   19 19  Temp: 98.2 F (36.8 C) 97.6 F (36.4 C) 97.8 F (36.6 C) 98 F (36.7 C)  TempSrc: Oral Oral Oral Oral  SpO2:      Weight:    63 kg  Height:        Exam: Gen alert, no distress No jvd or bruits Chest BLBS RRR  Abd soft ntnd no mass or ascites +bs Ext no LE or UE edema Neuro is alert    L nephrostomy tube   Home bp meds: Norvasc  5 hs Bisoprolol- hydrochlorothiazide 10-6.25 every day Lisinopril  40mg  at bedtime   Date                             Creat               eGFR (ml/min) 2012- dec 2018           0.60- 0.94         03/03/21                        1.09                 73 ml/min 06/23/24                        9.37                 5 ml/min   UA - small Hb, prot neg, 0-5 rbc/wbc UNa 85, UCr 47 CTA today: R kidney atrophic (was normal size on the 2018 CTA study), L kidney moderate hydronephrosis, no definite urolithiasis, ureter appears non-distended and urinary bladder decompressed. Renal US  8/23 post PCN -> R 5.2 cm / L 12 cm w/ nephrostomy tube in place, no hydronephrosis either side.    Assessment/ Plan: Renal failure: last creat was 1.09 in may 2022, eGFR 74 ml/min. Admit creatinine was 9.3 in the setting of 1 week of L flank pain. CT showed sig L sided hydronephrosis and atrophic R kidney. UA was negative. Renal US  done post-PCN showed 12cm L kidney w/ pcn tube in proper position, no hydro; also R kidney atrophic at 5 cm, not likely functional at this size.  AKI likely due to L-sided obstruction. May also have some CKD atrophic R kidney but not possible to say how significant at this point. IR placed L PCN 8/23 w/ good UOP overnight thru the drain. Creatinine down to 2.24 today. No uremic  symptoms. Urology is also following. F/u labs in am, cont renal diet for now  Encourage p.o. intake Plan hospital follow-up at discharge HTN: Improved. BP 230/ 98 on presentation. Home ACEi and hydrochlorothiazide on hold appropriately. Getting norvasc  + po labetalol  here. Bp have normalized.  COPD: not on any O2   Dr. Evalene Lanes  [facility] 06/29/2024, 7:54 AM  Recent Labs  Lab 06/27/24 0457 06/27/24 0729 06/28/24 0617 06/29/24 0541  HGB 8.4*  --  8.2*  --   ALBUMIN   --  2.5* 2.5*  --   CALCIUM   --  8.4* 8.9 8.9  PHOS  --  3.1 2.9  --   CREATININE  --  3.25* 2.46* 2.24*  K  --  3.6 3.8 3.6   Recent Labs  Lab 06/25/24 1041  IRON 63  TIBC 218*  FERRITIN 249   Inpatient medications:  amLODipine   10 mg Oral QPM   atorvastatin   40 mg Oral Daily   cyanocobalamin   1,000 mcg Subcutaneous Daily   divalproex   1,000 mg Oral QHS   folic acid   2 mg Oral Daily   heparin   5,000 Units Subcutaneous Q8H   labetalol   100 mg Oral BID   magnesium  oxide  200 mg Oral Daily   nicotine   21 mg Transdermal Daily   sodium chloride  flush  3 mL Intravenous Q12H   sodium chloride  flush  3 mL Intravenous Q12H   sodium chloride  flush  5 mL Intracatheter Q8H      acetaminophen  **OR** acetaminophen , bisacodyl , hydrALAZINE , HYDROmorphone  (DILAUDID ) injection, ipratropium, labetalol , levalbuterol , ondansetron  **OR** ondansetron  (ZOFRAN ) IV, oxyCODONE , senna-docusate, sodium phosphate , traZODone 

## 2024-06-29 NOTE — Plan of Care (Signed)

## 2024-06-29 NOTE — Plan of Care (Signed)
  Problem: Education: Goal: Knowledge of General Education information will improve Description: Including pain rating scale, medication(s)/side effects and non-pharmacologic comfort measures 06/29/2024 0848 by Alveria Reus, Gladis, RN Outcome: Completed/Met 06/29/2024 0848 by Alveria Reus Gladis, RN Outcome: Completed/Met   Problem: Health Behavior/Discharge Planning: Goal: Ability to manage health-related needs will improve Outcome: Completed/Met   Problem: Clinical Measurements: Goal: Ability to maintain clinical measurements within normal limits will improve Outcome: Completed/Met Goal: Will remain free from infection Outcome: Completed/Met Goal: Diagnostic test results will improve Outcome: Completed/Met Goal: Respiratory complications will improve Outcome: Completed/Met Goal: Cardiovascular complication will be avoided Outcome: Completed/Met   Problem: Activity: Goal: Risk for activity intolerance will decrease Outcome: Completed/Met   Problem: Nutrition: Goal: Adequate nutrition will be maintained Outcome: Completed/Met   Problem: Coping: Goal: Level of anxiety will decrease Outcome: Completed/Met   Problem: Elimination: Goal: Will not experience complications related to bowel motility Outcome: Completed/Met Goal: Will not experience complications related to urinary retention Outcome: Completed/Met   Problem: Pain Managment: Goal: General experience of comfort will improve and/or be controlled Outcome: Completed/Met   Problem: Safety: Goal: Ability to remain free from injury will improve Outcome: Completed/Met   Problem: Skin Integrity: Goal: Risk for impaired skin integrity will decrease Outcome: Completed/Met

## 2024-06-29 NOTE — Discharge Summary (Addendum)
 PATIENT DETAILS Name: [name] [name] Age: 73 y.o. Sex: male Date of Birth: July 05, 1951 MRN: [id number]. Admitting Physician: Adriana DELENA Grams, MD ERE:[facility]zm[facility], Norman, NP  Admit Date: 06/23/2024 Discharge date: 06/29/2024  Recommendations for Outpatient Follow-up:  Follow up with PCP in 1-2 weeks Please obtain CMP/CBC in one week Repeat folic acid /vitamin B12 levels in 6 weeks.  Admitted From:  Home  Disposition: Home health   Discharge Condition: good  CODE STATUS:   Code Status: Full Code   Diet recommendation:  Diet Order             Diet - low sodium heart healthy           Diet renal with fluid restriction Fluid restriction: 1200 mL Fluid; Room service appropriate? Yes; Fluid consistency: Thin  Diet effective now                    Brief Summary: Patient is a 73 y.o.  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   Significant events: 8/23>> admit to [facility].   Significant studies: 8/23>> CT abdomen/pelvis: Moderate left hydronephrosis without urolithiasis, marked right renal parenchymal atrophy. 8/23>> renal ultrasound: Atrophic right kidney, left nephrostomy tube in appropriate position. 8/26>> CT abdomen/pelvis: No perinephric hematoma.   Significant microbiology data: 8/23>> urine culture: No growth.   Procedures: 8/23>> nephrostomy left-by IR   Consults: Nephrology Urology  Brief Hospital Course: AKI Secondary to obstructive uropathy S/p left nephrostomy in place Renal function continues to improve-no longer on IV fluids-this was stopped yesterday. Stable for discharge-follow with PCP and repeat renal function in 1 week or so. See below regarding follow-up with IR/urology.     Left-sided hydronephrosis-s/p nephrostomy tube on 8/23 Urology suspects retroperitoneal fibrosis IR placed nephrostomy tube this  admission-with significant improvement in pain and renal function. Nephrostomy tube will be continued on discharge Reached to urology-Dr. Shane on 8/28-his office will arrange for follow-up IR has already made arrangements for follow-up for nephrostomy tube exchange.   Chronic right kidney atrophy Supportive care-suspicion that this may be a nonfunctioning kidney.   Normocytic anemia Probably to a combination of IV fluid dilution/renal failure-no evidence of blood loss Hb stable Follow CBC periodically   Folic acid /B12 deficiency Continue supplementation Recheck levels in 6 weeks.   HTN BP stable-continue amlodipine  10 mg daily and current dosing of labetalol  Given resolving AKI-olmesartan along with bisoprolol/HCTZ combination on hold. Follow/optimize    HLD Statin   Seizure disorder Depakote    COPD Stable Continue bronchodilators.   PAD-s/p bilateral femoral artery bypass graft 2018 Stable Continue statin  Discharge Diagnoses:  Principal Problem:   Acute renal failure (ARF) (HCC) Active Problems:   Accelerated hypertension   Hydronephrosis, left   Hyponatremia   History of hepatitis C   PAD (peripheral artery disease) (HCC)   AAA (abdominal aortic aneurysm) (HCC)   COPD without exacerbation (HCC)   Hypertension   Seizures (HCC)   Discharge Instructions:  Activity:  As tolerated with Full fall precautions use walker/cane & assistance as needed  Discharge Instructions     Call MD for:  extreme fatigue   Complete by: As directed    Call MD for:  persistant dizziness or light-headedness   Complete by: As directed    Call MD for:  temperature >100.4   Complete by: As directed    Diet - low sodium heart  healthy   Complete by: As directed    Discharge instructions   Complete by: As directed    Follow with Primary MD  [name], NP in 1-2 weeks  Please get a complete blood count and chemistry panel checked by your Primary MD at your next visit,  and again as instructed by your Primary MD.  [name] will get a call for a follow-up appointment from urology office and from interventional radiology office.  If you do not hear from them-please give them the call.  Their contact information will be in the after visit summary paperwork.  Get Medicines reviewed and adjusted: Please take all your medications with you for your next visit with your Primary MD  Laboratory/radiological data: Please request your Primary MD to go over all hospital tests and procedure/radiological results at the follow up, please ask your Primary MD to get all Hospital records sent to his/her office.  In some cases, they will be blood work, cultures and biopsy results pending at the time of your discharge. Please request that your primary care M.D. follows up on these results.  Also Note the following: If you experience worsening of your admission symptoms, develop shortness of breath, life threatening emergency, suicidal or homicidal thoughts you must seek medical attention immediately by calling 911 or calling your MD immediately  if symptoms less severe.  You must read complete instructions/literature along with all the possible adverse reactions/side effects for all the Medicines you take and that have been prescribed to you. Take any new Medicines after you have completely understood and accpet all the possible adverse reactions/side effects.   Do not drive when taking Pain medications or sleeping medications (Benzodaizepines)  Do not take more than prescribed Pain, Sleep and Anxiety Medications. It is not advisable to combine anxiety,sleep and pain medications without talking with your primary care practitioner  Special Instructions: If you have smoked or chewed Tobacco  in the last 2 yrs please stop smoking, stop any regular Alcohol  and or any Recreational drug use.  Wear Seat belts while driving.  Please note: You were cared for by a hospitalist during your  hospital stay. Once you are discharged, your primary care physician will handle any further medical issues. Please note that NO REFILLS for any discharge medications will be authorized once you are discharged, as it is imperative that you return to your primary care physician (or establish a relationship with a primary care physician if you do not have one) for your post hospital discharge needs so that they can reassess your need for medications and monitor your lab values.   Increase activity slowly   Complete by: As directed    No wound care   Complete by: As directed       Allergies as of 06/29/2024   No Known Allergies      Medication List     STOP taking these medications    bisoprolol-hydrochlorothiazide 10-6.25 MG tablet Commonly known as: ZIAC   meloxicam 7.5 MG tablet Commonly known as: MOBIC   olmesartan 40 MG tablet Commonly known as: BENICAR       TAKE these medications    amLODipine  10 MG tablet Commonly known as: NORVASC  Take 1 tablet (10 mg total) by mouth every evening. What changed:  medication strength how much to take   atorvastatin  40 MG tablet Commonly known as: LIPITOR Take 40 mg by mouth daily.   cyanocobalamin  1000 MCG tablet Commonly known as: VITAMIN B12 Take 1 tablet (  1,000 mcg total) by mouth daily.   divalproex  500 MG 24 hr tablet Commonly known as: DEPAKOTE  ER Take 1,000 mg by mouth at bedtime.   folic acid  1 MG tablet Commonly known as: FOLVITE  Take 1 tablet (1 mg total) by mouth daily.   labetalol  100 MG tablet Commonly known as: NORMODYNE  Take 1 tablet (100 mg total) by mouth 2 (two) times daily.        Follow-up Information     [facility] Follow up.   Specialty: Home Health Services Why: Someone will call you to schedule first home visit. Contact information: [address] [place] [address] [phone]         [name], NP. Schedule an appointment as soon as possible for a visit in 1  week(s).   Specialty: Nurse Practitioner Contact information: [address] [place] [address] [phone]         [facility] INTERVENTIONAL RADIOLOGY Follow up.   Specialty: Radiology Why: Office will call with date/time, If you dont hear from them,please give them a call Contact information: [address] [place]  [address] [phone]        Shane Steffan BROCKS, MD Follow up.   Specialty: Urology Why: Office will call with date/time, If you dont hear from them,please give them a call, Hospital follow up Contact information: [address] [place] [address] [phone]                No Known Allergies   Other Procedures/Studies: CT ABDOMEN PELVIS WO CONTRAST Result Date: 06/26/2024 CLINICAL DATA:  Follow-up left hydronephrosis, worsening anemia EXAM: CT ABDOMEN AND PELVIS WITHOUT CONTRAST TECHNIQUE: Multidetector CT imaging of the abdomen and pelvis was performed following the standard protocol without IV contrast. RADIATION DOSE REDUCTION: This exam was performed according to the departmental dose-optimization program which includes automated exposure control, adjustment of the mA and/or kV according to patient size and/or use of iterative reconstruction technique. COMPARISON:  CT and ultrasound from 06/23/2024 FINDINGS: Lower chest: Mild bibasilar atelectatic changes are noted. Small pleural effusions are noted increased when compared with the prior CT examination. Hepatobiliary: Gallbladder is decompressed. Liver appears within normal limits. Pancreas: Unremarkable. No pancreatic ductal dilatation or surrounding inflammatory changes. Spleen: Normal in size without focal abnormality. Adrenals/Urinary Tract: Adrenal glands are within normal limits. Right kidney is somewhat shrunken compared to the left. Left-sided nephrostomy catheter is noted within the renal pelvis. No hydronephrosis is seen. No  perinephric hematoma is noted. The bladder is partially distended with opacified urine. Stomach/Bowel: Postsurgical changes are noted in the sigmoid. No obstructive or inflammatory changes of the colon are seen. Fecal material is noted throughout without evidence of constipation or obstruction. The appendix appears within normal limits. Small bowel and stomach are within normal limits. Vascular/Lymphatic: Changes consistent with diffuse aortic calcifications are noted. Prior aortobifemoral bypass graft is noted. Some mild aneurysmal dilatation of the touchdown site is seen stable from prior exam. Reproductive: Prostate is unremarkable. Other: No abdominal wall hernia or abnormality. No abdominopelvic ascites. Musculoskeletal: Degenerative changes of lumbar spine are noted. IMPRESSION: Interval left-sided nephrostomy catheter placement with resolution of hydronephrosis. No findings to suggest perinephric hematoma are seen. No intra-abdominal fluid collection is identified to suggest hemorrhage. Bibasilar atelectasis and small effusions increased from the prior exam. Chronic changes as described above without acute abnormality. Electronically Signed   By: Oneil Devonshire M.D.   On: 06/26/2024 20:01   IR NEPHROSTOMY PLACEMENT  LEFT Result Date: 06/24/2024 CLINICAL DATA:  Moderate left hydronephrosis. Marked right renal parenchymal atrophy. Elevated creatinine. EXAM: LEFT PERCUTANEOUS NEPHROSTOMY CATHETER PLACEMENT UNDER ULTRASOUND AND FLUOROSCOPIC GUIDANCE FLUOROSCOPY: Radiation Exposure Index (as provided by the fluoroscopic device): 2.3 mGy air Kerma TECHNIQUE: Leftflank region prepped with Betadine, draped in usual sterile fashion, infiltrated locally with 1% lidocaine . Intravenous Fentanyl  50mcg and Versed  1mg  were administered by RN during a total moderate (conscious) sedation time of 15 minutes; the patient's level of consciousness and physiological / cardiorespiratory status were monitored continuously by  radiology RN under my direct supervision. Under real-time ultrasound guidance, a 21-gauge trocar needle was advanced into a posterior upper pole calyx. Ultrasound image documentation was saved. Urine spontaneously returned through the needle. Needle was exchanged over a guidewire for transitional dilator. Contrast injection confirmed appropriate positioning. Catheter was exchanged over a guidewire for a 10 French pigtail catheter, formed centrally within the left renal collecting system. Contrast injection confirms appropriate positioning and patency. Catheter secured externally with 0 Prolene suture and placed to external drain bag. COMPLICATIONS: COMPLICATIONS none IMPRESSION: 1. Technically successful left percutaneous nephrostomy catheter placement. Electronically Signed   By: JONETTA Faes M.D.   On: 06/24/2024 06:53   US  RENAL Result Date: 06/24/2024 CLINICAL DATA:  [id number] AKI (acute kidney injury) (HCC) [id number] EXAM: RENAL / URINARY TRACT ULTRASOUND COMPLETE COMPARISON:  CT angio chest and abdomen 06/23/2024 FINDINGS: Right Kidney: Renal measurements: 5.2 x 3 x 3.1 cm = volume: 25 mL. Echogenicity within normal limits. No mass or hydronephrosis visualized. Left Kidney: Renal measurements: 12 x 5.1 x 4.7 cm = volume: 152 mL. Nephrostomy tube noted within the left renal pelvis region. Echogenicity within normal limits. No mass or hydronephrosis visualized. Urinary bladder: Appears normal for degree of bladder distention. Other: None. IMPRESSION: 1. Atrophic right kidney. 2.  Left nephrostomy tube in grossly appropriate position. Electronically Signed   By: Morgane  Naveau M.D.   On: 06/24/2024 00:18   CT Angio Chest/Abd/Pel for Dissection W and/or Wo Contrast Result Date: 06/23/2024 EXAM: CTA CHEST, ABDOMEN AND PELVIS WITH AND WITHOUT CONTRAST 06/23/2024 11:14:57 AM TECHNIQUE: CTA of the chest was performed with and without the administration of intravenous contrast. CTA of the abdomen and pelvis was  performed with and without the administration of intravenous contrast. Multiplanar reformatted images are provided for review. MIP images are provided for review. Automated exposure control, iterative reconstruction, and/or weight based adjustment of the mA/kV was utilized to reduce the radiation dose to as low as reasonably achievable. COMPARISON: 05/02/2020 and previous. CLINICAL HISTORY: Acute aortic syndrome (AAS) suspected. AAs suspected; Omnipaque  350; 100 cc; c/o left side and LLQ pain. Pt states initial pain started over a week ago in both side and has just came down to the left side now primarily. Pt denies hx of kidney stones or injury. Pt denies complications with urination. Pt states hurts more when moving. FINDINGS: VASCULATURE: Scattered 3-vessel coronary calcifications. Mitral annulus calcifications. AORTA: No acute finding. Aortic atherosclerosis (ICD10-I70.0). No abdominal aortic aneurysm. No dissection. Patent infrarenal aortobifemoral graft. PULMONARY ARTERIES: No pulmonary embolism with the limits of this exam. GREAT VESSELS OF AORTIC ARCH: No acute finding. No dissection. No arterial occlusion or significant stenosis. CELIAC TRUNK: No acute finding. No occlusion or significant stenosis. SUPERIOR MESENTERIC ARTERY: Calcified plaque at the origin of the superior mesenteric artery resulting in short segment stenosis of at least moderate severity, atheromatous but patent distally. INFERIOR MESENTERIC ARTERY: Proximal occlusion likely related to graft placement RENAL ARTERIES: Proximal occlusion of  the right renal artery . 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 Heavily atheromatous native bilateral external and internal iliac arteries. CHEST: MEDIASTINUM: No mediastinal lymphadenopathy. The heart and pericardium demonstrate no acute abnormality. LUNGS AND PLEURA: Small left pleural effusion. Pulmonary emphysema. Chronic linear scarring/atelectasis posteriorly in both  lower lobes. THORACIC BONES AND SOFT TISSUES: Chronic T9 vertebral compression deformity. NO ACUTE BONE OR SOFT TISSUE ABNORMALITY. ABDOMEN AND PELVIS: LIVER: The liver is unremarkable. GALLBLADDER AND BILE DUCTS: Gallbladder is unremarkable. No biliary ductal dilatation. SPLEEN: The spleen is unremarkable. PANCREAS: The pancreas is unremarkable. ADRENAL GLANDS: Bilateral adrenal glands demonstrate no acute abnormality. KIDNEYS, URETERS AND BLADDER: Marked right renal parenchymal atrophy, progressive since 01/16/2017. Moderate left hydronephrosis. No definite urolithiasis. The ureter appears nondistended. Urinary bladder decompressed. GI AND BOWEL: Stomach and duodenal sweep demonstrate no acute abnormality. There is no bowel obstruction. No abnormal bowel wall thickening or distension. REPRODUCTIVE: Reproductive organs are unremarkable. PERITONEUM AND RETROPERITONEUM: Small volume abdominal and pelvic ascites. No free air. LYMPH NODES: No lymphadenopathy. ABDOMINAL BONES AND SOFT TISSUES: No acute soft tissue abnormality. Multilevel lumbar spondylotic change most marked L3-S1. IMPRESSION: 1. No evidence of acute aortic syndrome. 2. Marked right renal parenchymal atrophy, progressive since 12/20/19. 3. Moderate left hydronephrosis without definite urolithiasis. The ureter appears nondistended. 4. Small left pleural effusion. Electronically signed by: Katheleen Faes MD 06/23/2024 12:12 PM EDT RP Workstation: [id number]     TODAY-DAY OF DISCHARGE:  Subjective:   [name] [name] today has no headache,no chest abdominal pain,no new weakness tingling or numbness, feels much better wants to go home today.   Objective:   Blood pressure 135/70, pulse 63, temperature 98 F (36.7 C), temperature source Oral, resp. rate 19, height 5' 9 (1.753 m), weight 63 kg, SpO2 92%.  Intake/Output Summary (Last 24 hours) at 06/29/2024 0845 Last data filed at 06/29/2024 0600 Gross per 24 hour  Intake 430 ml  Output 950 ml   Net -520 ml   Filed Weights   06/24/24 0500 06/25/24 0500 06/29/24 0532  Weight: 60.7 kg 62.7 kg 63 kg    Exam: Awake Alert, Oriented *3, No new F.N deficits, Normal affect [place].AT,PERRAL Supple Neck,No JVD, No cervical lymphadenopathy appriciated.  Symmetrical Chest wall movement, Good air movement bilaterally, CTAB RRR,No Gallops,Rubs or new Murmurs, No Parasternal Heave +ve B.Sounds, Abd Soft, Non tender, No organomegaly appriciated, No rebound -guarding or rigidity. No Cyanosis, Clubbing or edema, No new Rash or bruise   PERTINENT RADIOLOGIC STUDIES: No results found.   PERTINENT LAB RESULTS: CBC: Recent Labs    06/27/24 0457 06/28/24 0617  WBC 5.2 3.9*  HGB 8.4* 8.2*  HCT 25.2* 24.7*  PLT 167 166   CMET CMP     Component Value Date/Time   NA 136 06/29/2024 0541   NA 139 03/03/2021 1223   K 3.6 06/29/2024 0541   CL 104 06/29/2024 0541   CO2 21 (L) 06/29/2024 0541   GLUCOSE 83 06/29/2024 0541   BUN 25 (H) 06/29/2024 0541   BUN 12 03/03/2021 1223   CREATININE 2.24 (H) 06/29/2024 0541   CALCIUM  8.9 06/29/2024 0541   PROT 5.7 (L) 06/26/2024 0528   PROT 7.5 03/03/2021 1223   ALBUMIN  2.5 (L) 06/28/2024 0617   ALBUMIN  4.3 03/03/2021 1223   AST 12 (L) 06/26/2024 0528   ALT <5 06/26/2024 0528   ALKPHOS 34 (L) 06/26/2024 0528   BILITOT 0.6 06/26/2024 0528   BILITOT 0.7 03/03/2021 1223   EGFR 73 03/03/2021 1223  GFRNONAA 30 (L) 06/29/2024 0541    GFR Estimated Creatinine Clearance: 25.4 mL/min (A) (by C-G formula based on SCr of 2.24 mg/dL (H)). No results for input(s): LIPASE, AMYLASE in the last 72 hours. No results for input(s): CKTOTAL, CKMB, CKMBINDEX, TROPONINI in the last 72 hours. Invalid input(s): POCBNP No results for input(s): DDIMER in the last 72 hours. No results for input(s): HGBA1C in the last 72 hours. No results for input(s): CHOL, HDL, LDLCALC, TRIG, CHOLHDL, LDLDIRECT in the last 72 hours. No results for  input(s): TSH, T4TOTAL, T3FREE, THYROIDAB in the last 72 hours.  Invalid input(s): FREET3 No results for input(s): VITAMINB12, FOLATE, FERRITIN, TIBC, IRON, RETICCTPCT in the last 72 hours. Coags: No results for input(s): INR in the last 72 hours.  Invalid input(s): PT Microbiology: Recent Results (from the past 240 hours)  Aerobic/Anaerobic Culture w Gram Stain (surgical/deep wound)     Status: None   Collection Time: 06/23/24  6:19 PM   Specimen: Fluid; Urine  Result Value Ref Range Status   Specimen Description FLUID  Final   Special Requests DRAIN, URINE  Final   Gram Stain NO WBC SEEN NO ORGANISMS SEEN   Final   Culture   Final    No growth aerobically or anaerobically. Performed at [facility], [address] [place], [place] [address]    Report Status 06/28/2024 FINAL  Final    FURTHER DISCHARGE INSTRUCTIONS:  Get Medicines reviewed and adjusted: Please take all your medications with you for your next visit with your Primary MD  Laboratory/radiological data: Please request your Primary MD to go over all hospital tests and procedure/radiological results at the follow up, please ask your Primary MD to get all Hospital records sent to his/her office.  In some cases, they will be blood work, cultures and biopsy results pending at the time of your discharge. Please request that your primary care M.D. goes through all the records of your hospital data and follows up on these results.  Also Note the following: If you experience worsening of your admission symptoms, develop shortness of breath, life threatening emergency, suicidal or homicidal thoughts you must seek medical attention immediately by calling 911 or calling your MD immediately  if symptoms less severe.  You must read complete instructions/literature along with all the possible adverse reactions/side effects for all the Medicines you take and that have been prescribed to you. Take  any new Medicines after you have completely understood and accpet all the possible adverse reactions/side effects.   Do not drive when taking Pain medications or sleeping medications (Benzodaizepines)  Do not take more than prescribed Pain, Sleep and Anxiety Medications. It is not advisable to combine anxiety,sleep and pain medications without talking with your primary care practitioner  Special Instructions: If you have smoked or chewed Tobacco  in the last 2 yrs please stop smoking, stop any regular Alcohol  and or any Recreational drug use.  Wear Seat belts while driving.  Please note: You were cared for by a hospitalist during your hospital stay. Once you are discharged, your primary care physician will handle any further medical issues. Please note that NO REFILLS for any discharge medications will be authorized once you are discharged, as it is imperative that you return to your primary care physician (or establish a relationship with a primary care physician if you do not have one) for your post hospital discharge needs so that they can reassess your need for medications and monitor your lab  values.  Total Time spent coordinating discharge including counseling, education and face to face time equals greater than 30 minutes.  Signed: Damarys Speir 06/29/2024 8:45 AM

## 2024-07-10 ENCOUNTER — Telehealth (HOSPITAL_COMMUNITY): Payer: Self-pay

## 2024-07-10 NOTE — Telephone Encounter (Signed)
Called to schedule neph exchange, no answer, left vm. AB

## 2024-08-13 ENCOUNTER — Emergency Department (HOSPITAL_COMMUNITY)
Admission: EM | Admit: 2024-08-13 | Discharge: 2024-08-14 | Discharge status: Against Medical Advice | Attending: Emergency Medicine | Admitting: Emergency Medicine

## 2024-08-13 DIAGNOSIS — Z5321 Procedure and treatment not carried out due to patient leaving prior to being seen by health care provider: Secondary | ICD-10-CM | POA: Insufficient documentation

## 2024-08-13 DIAGNOSIS — Z008 Encounter for other general examination: Secondary | ICD-10-CM | POA: Insufficient documentation

## 2024-08-13 NOTE — ED Notes (Signed)
 Pt called for triage and did not respond. Pt removed from the floor.

## 2024-08-23 ENCOUNTER — Emergency Department (HOSPITAL_COMMUNITY)
Admission: EM | Admit: 2024-08-23 | Discharge: 2024-08-23 | Discharge status: Against Medical Advice | Attending: Emergency Medicine | Admitting: Emergency Medicine

## 2024-08-23 ENCOUNTER — Other Ambulatory Visit: Payer: Self-pay

## 2024-08-23 ENCOUNTER — Emergency Department (HOSPITAL_COMMUNITY)

## 2024-08-23 DIAGNOSIS — Y846 Urinary catheterization as the cause of abnormal reaction of the patient, or of later complication, without mention of misadventure at the time of the procedure: Secondary | ICD-10-CM | POA: Diagnosis not present

## 2024-08-23 DIAGNOSIS — T83038A Leakage of other indwelling urethral catheter, initial encounter: Secondary | ICD-10-CM | POA: Insufficient documentation

## 2024-08-23 DIAGNOSIS — Z5321 Procedure and treatment not carried out due to patient leaving prior to being seen by health care provider: Secondary | ICD-10-CM | POA: Insufficient documentation

## 2024-08-23 LAB — CBC WITH DIFFERENTIAL/PLATELET
Abs Immature Granulocytes: 0.02 K/uL (ref 0.00–0.07)
Basophils Absolute: 0.1 K/uL (ref 0.0–0.1)
Basophils Relative: 1 %
Eosinophils Absolute: 0.1 K/uL (ref 0.0–0.5)
Eosinophils Relative: 2 %
HCT: 30.8 % — ABNORMAL LOW (ref 39.0–52.0)
Hemoglobin: 9.5 g/dL — ABNORMAL LOW (ref 13.0–17.0)
Immature Granulocytes: 0 %
Lymphocytes Relative: 43 %
Lymphs Abs: 2.1 K/uL (ref 0.7–4.0)
MCH: 31.7 pg (ref 26.0–34.0)
MCHC: 30.8 g/dL (ref 30.0–36.0)
MCV: 102.7 fL — ABNORMAL HIGH (ref 80.0–100.0)
Monocytes Absolute: 0.5 K/uL (ref 0.1–1.0)
Monocytes Relative: 10 %
Neutro Abs: 2.1 K/uL (ref 1.7–7.7)
Neutrophils Relative %: 44 %
Platelets: 128 K/uL — ABNORMAL LOW (ref 150–400)
RBC: 3 MIL/uL — ABNORMAL LOW (ref 4.22–5.81)
RDW: 16.1 % — ABNORMAL HIGH (ref 11.5–15.5)
WBC: 4.8 K/uL (ref 4.0–10.5)
nRBC: 0 % (ref 0.0–0.2)

## 2024-08-23 LAB — URINALYSIS, ROUTINE W REFLEX MICROSCOPIC
Bilirubin Urine: NEGATIVE
Glucose, UA: NEGATIVE mg/dL
Ketones, ur: NEGATIVE mg/dL
Nitrite: POSITIVE — AB
Protein, ur: 100 mg/dL — AB
Specific Gravity, Urine: 1.008 (ref 1.005–1.030)
pH: 8 (ref 5.0–8.0)

## 2024-08-23 LAB — I-STAT CHEM 8, ED
BUN: 28 mg/dL — ABNORMAL HIGH (ref 8–23)
Calcium, Ion: 1.13 mmol/L — ABNORMAL LOW (ref 1.15–1.40)
Chloride: 106 mmol/L (ref 98–111)
Creatinine, Ser: 2.3 mg/dL — ABNORMAL HIGH (ref 0.61–1.24)
Glucose, Bld: 108 mg/dL — ABNORMAL HIGH (ref 70–99)
HCT: 28 % — ABNORMAL LOW (ref 39.0–52.0)
Hemoglobin: 9.5 g/dL — ABNORMAL LOW (ref 13.0–17.0)
Potassium: 4.4 mmol/L (ref 3.5–5.1)
Sodium: 139 mmol/L (ref 135–145)
TCO2: 23 mmol/L (ref 22–32)

## 2024-08-23 NOTE — ED Triage Notes (Signed)
 Patient reports his urinary catheter is leaking. He reports it just started leaking today. Patient reports it was placed 6-8 weeks ago here at the hospital. Patient is unsure of why they placed the foley catheter.

## 2024-08-23 NOTE — ED Notes (Signed)
 Pt decide to leave. I discussed plan of care with him and advised him that I would recommended him stay since he was already here. Pt said he wanted to leave since his cath bag wasn't leaking now. I walked him out the the lobby to the security desk where Ron escorted him to his car. Pt was calm and cooperative when leaving.

## 2024-10-08 ENCOUNTER — Ambulatory Visit (HOSPITAL_COMMUNITY)
Admission: RE | Admit: 2024-10-08 | Discharge: 2024-10-08 | Disposition: A | Discharge status: Home / Self Care | Source: Ambulatory Visit

## 2024-10-08 ENCOUNTER — Other Ambulatory Visit (HOSPITAL_COMMUNITY): Payer: Self-pay | Admitting: Interventional Radiology

## 2024-10-08 DIAGNOSIS — N179 Acute kidney failure, unspecified: Secondary | ICD-10-CM

## 2024-10-08 DIAGNOSIS — N289 Disorder of kidney and ureter, unspecified: Secondary | ICD-10-CM

## 2024-10-08 HISTORY — PX: IR NEPHROSTOMY EXCHANGE LEFT: IMG6069

## 2024-10-08 MED ORDER — LIDOCAINE-EPINEPHRINE 1 %-1:100000 IJ SOLN
INTRAMUSCULAR | Status: AC
  Filled 2024-10-08: qty 1

## 2024-10-08 MED ORDER — IOHEXOL 300 MG/ML  SOLN
50.0000 mL | Freq: Once | INTRAMUSCULAR | Status: AC | PRN
  Administered 2024-10-08: 10 mL

## 2024-10-08 MED ORDER — LIDOCAINE-EPINEPHRINE 1 %-1:100000 IJ SOLN
20.0000 mL | Freq: Once | INTRAMUSCULAR | Status: AC
  Administered 2024-10-08: 10 mL via INTRADERMAL

## 2024-11-10 ENCOUNTER — Encounter (HOSPITAL_BASED_OUTPATIENT_CLINIC_OR_DEPARTMENT_OTHER): Payer: Self-pay | Admitting: Emergency Medicine

## 2024-11-10 ENCOUNTER — Other Ambulatory Visit: Payer: Self-pay

## 2024-11-10 ENCOUNTER — Emergency Department (HOSPITAL_BASED_OUTPATIENT_CLINIC_OR_DEPARTMENT_OTHER)
Admission: EM | Admit: 2024-11-10 | Discharge: 2024-11-10 | Discharge status: Against Medical Advice | Attending: Emergency Medicine | Admitting: Emergency Medicine

## 2024-11-10 DIAGNOSIS — Z5321 Procedure and treatment not carried out due to patient leaving prior to being seen by health care provider: Secondary | ICD-10-CM | POA: Insufficient documentation

## 2024-11-10 DIAGNOSIS — R339 Retention of urine, unspecified: Secondary | ICD-10-CM | POA: Diagnosis present

## 2024-11-10 NOTE — ED Notes (Signed)
 Patient was told to move his car from the front of [facility] and the patient drove off.

## 2024-11-10 NOTE — ED Triage Notes (Signed)
 Pt via pov from home with urinary retention; states he was walking around and the home and the holder fell off. He reports that he was sleeping on his back last night and no urine came out at all; bag was empty this morning. He also reports that it has filled up just since he got to the ED; wants evaluation of the catheter. Pt a&o x 4; nad noted.

## 2024-12-31 ENCOUNTER — Other Ambulatory Visit (HOSPITAL_COMMUNITY)
